# Patient Record
Sex: Female | Born: 1995 | Race: Black or African American | Hispanic: No | Marital: Single | Smoking: Never smoker
Health system: Southern US, Community
[De-identification: ages and names within clinical notes are randomized; demographics above are authoritative.]

## PROBLEM LIST (undated history)

## (undated) ENCOUNTER — Inpatient Hospital Stay (HOSPITAL_COMMUNITY): Payer: Self-pay

## (undated) DIAGNOSIS — T7840XA Allergy, unspecified, initial encounter: Secondary | ICD-10-CM

## (undated) DIAGNOSIS — F32A Depression, unspecified: Secondary | ICD-10-CM

## (undated) DIAGNOSIS — M1990 Unspecified osteoarthritis, unspecified site: Secondary | ICD-10-CM

## (undated) DIAGNOSIS — L309 Dermatitis, unspecified: Secondary | ICD-10-CM

## (undated) DIAGNOSIS — F419 Anxiety disorder, unspecified: Secondary | ICD-10-CM

## (undated) HISTORY — DX: Dermatitis, unspecified: L30.9

## (undated) HISTORY — DX: Unspecified osteoarthritis, unspecified site: M19.90

## (undated) HISTORY — PX: NO PAST SURGERIES: SHX2092

## (undated) HISTORY — DX: Allergy, unspecified, initial encounter: T78.40XA

## (undated) HISTORY — DX: Depression, unspecified: F32.A

## (undated) HISTORY — DX: Anxiety disorder, unspecified: F41.9

---

## 2015-06-18 ENCOUNTER — Encounter (HOSPITAL_COMMUNITY): Payer: Self-pay

## 2015-06-18 ENCOUNTER — Emergency Department (HOSPITAL_COMMUNITY)
Admission: EM | Admit: 2015-06-18 | Discharge: 2015-06-18 | Disposition: A | Discharge status: Home / Self Care | Payer: Self-pay | Attending: Emergency Medicine | Admitting: Emergency Medicine

## 2015-06-18 DIAGNOSIS — H109 Unspecified conjunctivitis: Secondary | ICD-10-CM | POA: Insufficient documentation

## 2015-06-18 MED ORDER — ERYTHROMYCIN 5 MG/GM OP OINT
1.0000 "application " | TOPICAL_OINTMENT | Freq: Three times a day (TID) | OPHTHALMIC | Status: DC
  Administered 2015-06-18: 1 via OPHTHALMIC
  Filled 2015-06-18: qty 3.5

## 2015-06-18 NOTE — ED Provider Notes (Signed)
CSN: [id number]     Arrival date & time 06/18/15  [id number] History   First MD Initiated Contact with Patient 06/18/15 [phone]     Chief Complaint  Patient presents with  . Eye Pain     (Consider location/radiation/quality/duration/timing/severity/associated sxs/prior Treatment) HPI Comments: Pt states that she woke up this morning with redness and drainage. No visual changes. Doesn't wear contacts  Patient is a 19 y.o. female presenting with eye pain. The history is provided by the patient. No language interpreter was used.  Eye Pain This is a new problem. The current episode started today. The problem occurs constantly. The problem has been unchanged. Pertinent negatives include no fever. Nothing aggravates the symptoms. She has tried nothing for the symptoms.    History reviewed. No pertinent past medical history. History reviewed. No pertinent past surgical history. No family history on file. Social History  Substance Use Topics  . Smoking status: Never Smoker   . Smokeless tobacco: None  . Alcohol Use: No   OB History    No data available     Review of Systems  Constitutional: Negative for fever.  Eyes: Positive for pain.  All other systems reviewed and are negative.     Allergies  Apple and Chocolate  Home Medications   Prior to Admission medications   Not on File   BP 117/71 mmHg  Pulse 85  Temp(Src) 97.8 F (36.6 C) (Oral)  Resp 16  Ht 5\' 7"  (1.702 m)  Wt 68.04 kg  BMI 23.49 kg/m2  SpO2 95%  LMP 06/08/2015 Physical Exam  Constitutional: She appears well-developed and well-nourished.  HENT:  Right Ear: External ear normal.  Left Ear: External ear normal.  Eyes: EOM are normal. Pupils are equal, round, and reactive to light. Left eye exhibits discharge. Left conjunctiva is injected.  Cardiovascular: Normal rate and regular rhythm.   Pulmonary/Chest: Effort normal and breath sounds normal.  Musculoskeletal: Normal range of motion.  Nursing note and  vitals reviewed.   ED Course  Procedures (including critical care time) Labs Review Labs Reviewed - No data to display  Imaging Review No results found. I have personally reviewed and evaluated these images and lab results as part of my medical decision-making.   EKG Interpretation None      MDM   Final diagnoses:  Conjunctivitis of left eye    Will give erythromycin ointment. Discussed follow up and return precautions    Teressa LowerVrinda Jayleen Scaglione, NP 06/18/15 [phone]  Laurence Spatesachel Morgan Little, MD 06/18/15 [phone]

## 2015-06-18 NOTE — Discharge Instructions (Signed)

## 2015-06-18 NOTE — ED Notes (Signed)
Pt. Presents with complaint of L eye pain starting this AM. Pt. Eye is red, denies drainage or recent cold symptoms.

## 2015-06-18 NOTE — ED Notes (Signed)
See provider note for assessment

## 2015-06-18 NOTE — ED Notes (Signed)
20/200 visual acuity completed was without pts glasses off.  20/20 and 20/15 visual acuity completed with glasses on.

## 2015-06-21 ENCOUNTER — Emergency Department (INDEPENDENT_AMBULATORY_CARE_PROVIDER_SITE_OTHER)
Admission: EM | Admit: 2015-06-21 | Discharge: 2015-06-21 | Disposition: A | Discharge status: Home / Self Care | Payer: Self-pay | Source: Home / Self Care

## 2015-06-21 DIAGNOSIS — A499 Bacterial infection, unspecified: Secondary | ICD-10-CM

## 2015-06-21 DIAGNOSIS — H109 Unspecified conjunctivitis: Secondary | ICD-10-CM

## 2015-06-21 MED ORDER — POLYMYXIN B-TRIMETHOPRIM 10000-0.1 UNIT/ML-% OP SOLN: 1.0000 [drp] | OPHTHALMIC | Status: DC

## 2015-06-21 NOTE — ED Provider Notes (Signed)
CSN: [id number]     Arrival date & time 06/21/15  1530 History   None    Chief Complaint  Patient presents with  . Conjunctivitis   HPI  20 y/o ? Recent Rx ED with conjunctivitis given erythromycin ointment Had been placing the ointment in left eye and then in the right eye developed conjunctivitis in the right eye as well Left eye is not improved she's had increasing pain, tenderness and sensitivity to light She has no fever no chills She has neck C date and tearing which is increased in the left eye    No past medical history on file. No past surgical history on file. No family history on file. Social History  Substance Use Topics  . Smoking status: Never Smoker   . Smokeless tobacco: Not on file  . Alcohol Use: No   OB History    No data available     Review of Systems  Allergies  Apple and Chocolate  Home Medications   Prior to Admission medications   Not on File   Meds Ordered and Administered this Visit  Medications - No data to display  LMP 06/08/2015 No data found.   Physical Exam  NCAT Injected left eye with tearing external ocular movements are intact however without pain Throat soft supple Chest clinically clear Abdomen soft nontender No added sound  ED Course  Procedures (including critical care time)  Labs Review Labs Reviewed - No data to display  Imaging Review No results found. Left eye distance vision 20/100 Right eye distant 20/50      MDM  No diagnosis found. Patient has worsening conjunctivitis have informed her that the ointment may be passing the infection from 1 I to the other and I have told her to stop this I will prescribe her an over-the-counter generic antibiotic to take for both eyes I have given her the name of the ophthalmologist on call Dr. Genia DelMincey to be seen at her office in 2 days if no better although I think that the cream may have caused her to translocate bacteria 18 to the other and worsening  infection   She understands clearly these instructions   Pleas KochJai Esperansa Sarabia, MD [facility] [phone]    Rhetta MuraJai-Gurmukh Kay Shippy, MD 06/21/15 1744

## 2015-06-21 NOTE — Discharge Instructions (Signed)

## 2015-06-21 NOTE — ED Notes (Signed)
Patient states she was treated this past Friday in the ED for conjunctivitis to her left eye\ Patient is using the ointment prescribed and now having redness and discharge to both eyes The left eye being the worse

## 2016-07-03 ENCOUNTER — Encounter (HOSPITAL_COMMUNITY): Payer: Self-pay | Admitting: Emergency Medicine

## 2016-07-03 ENCOUNTER — Emergency Department (HOSPITAL_COMMUNITY)
Admission: EM | Admit: 2016-07-03 | Discharge: 2016-07-03 | Disposition: A | Discharge status: Home / Self Care | Payer: Self-pay | Attending: Emergency Medicine | Admitting: Emergency Medicine

## 2016-07-03 DIAGNOSIS — B349 Viral infection, unspecified: Secondary | ICD-10-CM

## 2016-07-03 DIAGNOSIS — Z79899 Other long term (current) drug therapy: Secondary | ICD-10-CM | POA: Insufficient documentation

## 2016-07-03 MED ORDER — BENZONATATE 100 MG PO CAPS: 200.0000 mg | ORAL_CAPSULE | Freq: Two times a day (BID) | ORAL | 0 refills | Status: DC | PRN

## 2016-07-03 MED ORDER — OXYMETAZOLINE HCL 0.05 % NA SOLN: 1.0000 | Freq: Two times a day (BID) | NASAL | 0 refills | Status: DC

## 2016-07-03 NOTE — Discharge Instructions (Signed)
Take your medications as prescribed. I also recommend taking Tylenol and ibuprofen as prescribed over-the-counter, oximetry in between doses every 3-4 hours. Continue drinking fluids at home to remain hydrated. I recommend eating a bland diet for the next few days and taper symptoms have improved. °Follow-up with your primary care provider in the next 3-4 days if symptoms have not improved. °Return to the emergency department if symptoms worsen or new onset of headache, neck stiffness, difficulty breathing, coughing up blood, chest pain, abdominal pain, vomiting, unable to keep fluids down.  °

## 2016-07-03 NOTE — ED Provider Notes (Signed)
[facility]-EMERGENCY DEPT Provider Note   CSN: [id number] Arrival date & time: 07/03/16  1517  By signing my name below, I, Natasha Beard, attest that this documentation has been prepared under the direction and in the presence of Melburn Hake, [place]. Electronically Signed: Teofilo Beard, ED Scribe. 07/03/2016. 5:08 PM.    History   Chief Complaint Chief Complaint  Patient presents with  . Cough  . Fever    The history is provided by the patient. No language interpreter was used.   HPI Comments:  Natasha Beard is a 21 y.o. female who presents to the Emergency Department complaining of a persistent cough x 3 days. Pt states that sometimes the cough is productive with thick sputum. Pt complains of associated chest congestion, nasal congestion, sore throat, rhinorrhea, SOB, vomitingx1 three days ago. Pt reports that she had a fever that has resolved. Pt has taken mucinex and ibuprofen (last dose yesterday) with mild relief. Pt reports possible sick contacts at work [employer]). Pt is a light smoker (1 Black and Mild/day).  Pt denies HA, neck stiffness, trismus, drooling, voice change, chest pain, ear pain, abdominal pain, rash.   History reviewed. No pertinent past medical history.  There are no active problems to display for this patient.   History reviewed. No pertinent surgical history.  OB History    No data available       Home Medications    Prior to Admission medications   Medication Sig Start Date End Date Taking? Authorizing Provider  benzonatate (TESSALON) 100 MG capsule Take 2 capsules (200 mg total) by mouth 2 (two) times daily as needed for cough. 07/03/16   Barrett Henle, PA-C  oxymetazoline (AFRIN NASAL SPRAY) 0.05 % nasal spray Place 1 spray into both nostrils 2 (two) times daily. Spray once into each nostril twice daily for up to the next 3 days. Do not use for more than 3 days to prevent rebound rhinorrhea. 07/03/16   Barrett Henle, PA-C    trimethoprim-polymyxin b (POLYTRIM) ophthalmic solution Place 1 drop into both eyes every 4 (four) hours. 06/21/15   Rhetta Mura, MD    Family History No family history on file.  Social History Social History  Substance Use Topics  . Smoking status: Never Smoker  . Smokeless tobacco: Not on file  . Alcohol use No     Allergies   Apple and Chocolate   Review of Systems Review of Systems  Constitutional: Negative for fever.  HENT: Positive for congestion, rhinorrhea and sore throat. Negative for ear pain.   Respiratory: Positive for cough, chest tightness and shortness of breath.   Cardiovascular: Negative for chest pain.  Gastrointestinal: Positive for vomiting. Negative for abdominal pain.     Physical Exam Updated Vital Signs BP 127/80 (BP Location: Left Arm)   Pulse 96   Temp 98.4 F (36.9 C) (Oral)   Resp 18   LMP 06/09/2016   SpO2 100%   Physical Exam  Constitutional: She appears well-developed and well-nourished. No distress.  HENT:  Head: Normocephalic and atraumatic.  Right Ear: Tympanic membrane normal.  Left Ear: Tympanic membrane normal.  Nose: Nose normal. Right sinus exhibits no maxillary sinus tenderness and no frontal sinus tenderness. Left sinus exhibits no maxillary sinus tenderness and no frontal sinus tenderness.  Mouth/Throat: Uvula is midline and mucous membranes are normal. No trismus in the jaw. No uvula swelling. Posterior oropharyngeal erythema present. No oropharyngeal exudate, posterior oropharyngeal edema or tonsillar abscesses. No tonsillar exudate.  Eyes: Conjunctivae and EOM are normal. Right eye exhibits no discharge. Left eye exhibits no discharge. No scleral icterus.  Neck: Normal range of motion. Neck supple.  Cardiovascular: Normal rate, regular rhythm, normal heart sounds and intact distal pulses.   No murmur heard. Pulmonary/Chest: Effort normal and breath sounds normal. No respiratory distress. She has no wheezes. She  has no rales. She exhibits no tenderness.  Abdominal: Soft. She exhibits no distension and no mass. There is no tenderness. There is no rebound and no guarding. No hernia.  Musculoskeletal: She exhibits no edema.  Lymphadenopathy:    She has no cervical adenopathy.  Neurological: She is alert.  Skin: Skin is warm and dry.  Psychiatric: She has a normal mood and affect.  Nursing note and vitals reviewed.    ED Treatments / Results  DIAGNOSTIC STUDIES:  Oxygen Saturation is 100% on RA, normal by my interpretation.    COORDINATION OF CARE:  5:08 PM Discussed treatment plan with pt at bedside and pt agreed to plan.   Labs (all labs ordered are listed, but only abnormal results are displayed) Labs Reviewed - No data to display  EKG  EKG Interpretation None       Radiology No results found.  Procedures Procedures (including critical care time)  Medications Ordered in ED Medications - No data to display   Initial Impression / Assessment and Plan / ED Course  I have reviewed the triage vital signs and the nursing notes.  Pertinent labs & imaging results that were available during my care of the patient were reviewed by me and considered in my medical decision making (see chart for details).  Clinical Course     Patient with symptoms consistent with influenza vs viral URI.  Vitals are stable, no fever.  No signs of dehydration, tolerating PO's.  Lungs are clear. Due to patient's presentation and physical exam a chest x-ray was not ordered bc likely diagnosis of flu vs URI.  Discussed the cost versus benefit of Tamiflu treatment with the patient.  The patient understands that symptoms are greater than the recommended 24-48 hour window of treatment.  Patient will be discharged with instructions to orally hydrate, rest, and use over-the-counter medications such as anti-inflammatories ibuprofen and Aleve for muscle aches and Tylenol for fever.  Patient will also be given a cough  suppressant.    Final Clinical Impressions(s) / ED Diagnoses   Final diagnoses:  Viral illness    New Prescriptions New Prescriptions   BENZONATATE (TESSALON) 100 MG CAPSULE    Take 2 capsules (200 mg total) by mouth 2 (two) times daily as needed for cough.   OXYMETAZOLINE (AFRIN NASAL SPRAY) 0.05 % NASAL SPRAY    Place 1 spray into both nostrils 2 (two) times daily. Spray once into each nostril twice daily for up to the next 3 days. Do not use for more than 3 days to prevent rebound rhinorrhea.   I personally performed the services described in this documentation, which was scribed in my presence. The recorded information has been reviewed and is accurate.     Satira Sarkicole Elizabeth SlickvilleNadeau, [place]-C 07/03/16 1713    Lorre NickAnthony Allen, MD 07/03/16 [id number]

## 2016-07-03 NOTE — ED Triage Notes (Signed)
Pt complaint of cough and fever since Friday; pt reports sore throat worsening with cough. Pt denies CP or SOB.

## 2016-07-03 NOTE — ED Notes (Signed)
Patient was alert, oriented and stable upon discharge. RN went over AVS and patient had no further questions.  

## 2016-09-12 ENCOUNTER — Encounter (HOSPITAL_COMMUNITY): Payer: Self-pay

## 2016-09-12 ENCOUNTER — Emergency Department (HOSPITAL_COMMUNITY)
Admission: EM | Admit: 2016-09-12 | Discharge: 2016-09-12 | Disposition: A | Discharge status: Home / Self Care | Payer: Self-pay | Attending: Emergency Medicine | Admitting: Emergency Medicine

## 2016-09-12 DIAGNOSIS — Y9389 Activity, other specified: Secondary | ICD-10-CM | POA: Insufficient documentation

## 2016-09-12 DIAGNOSIS — M546 Pain in thoracic spine: Secondary | ICD-10-CM | POA: Insufficient documentation

## 2016-09-12 DIAGNOSIS — M545 Low back pain, unspecified: Secondary | ICD-10-CM

## 2016-09-12 DIAGNOSIS — X500XXA Overexertion from strenuous movement or load, initial encounter: Secondary | ICD-10-CM | POA: Insufficient documentation

## 2016-09-12 DIAGNOSIS — Y990 Civilian activity done for income or pay: Secondary | ICD-10-CM | POA: Insufficient documentation

## 2016-09-12 DIAGNOSIS — Y9289 Other specified places as the place of occurrence of the external cause: Secondary | ICD-10-CM | POA: Insufficient documentation

## 2016-09-12 MED ORDER — CYCLOBENZAPRINE HCL 10 MG PO TABS: 10.0000 mg | ORAL_TABLET | Freq: Three times a day (TID) | ORAL | 0 refills | Status: AC | PRN

## 2016-09-12 MED ORDER — IBUPROFEN 800 MG PO TABS: 800.0000 mg | ORAL_TABLET | Freq: Four times a day (QID) | ORAL | 0 refills | Status: AC | PRN

## 2016-09-12 NOTE — ED Triage Notes (Signed)
Pt states thoracic and lumbar back pain X1 week. 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 Pt ambulatory without difficulty.

## 2016-09-12 NOTE — ED Provider Notes (Signed)
[facility]-EMERGENCY DEPT Provider Note   CSN: [id number] Arrival date & time: 09/12/16  1241   By signing my name below, I, Bobbie Stack, attest that this documentation has been prepared under the direction and in the presence of Michela Pitcher, [place]. Electronically Signed: Bobbie Stack, Scribe. 09/12/16. 1:54 PM. History   Chief Complaint Chief Complaint  Patient presents with  . Back Pain    The history is provided by the patient. No language interpreter was used.  HPI Comments: Natasha Beard is a 21 y.o. female who presents to the Emergency Department complaining of non-radiating lower back pain since yesterday. The patient states that she experienced a sudden onset of back pain that began around 8 days ago. She states that the pain was severe "grabbing pain" at this time and then resolved shortly afterwards. The pain returned yesterday but was less severe than her previous episode of back pain. She rates her pain 3/10 currently. She describes the pain as a "squeezing sensation". She states that she had never experienced anything like this in the past. 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 She denies any heavy lifting on the day that the pain began. Pain is localized to mid to low back and does not radiate into the legs. She also denies any recent injuries, falls, numbness, tingling, weakness, leg pain, urinary symptoms, headaches, and dizziness.  History reviewed. No pertinent past medical history.  There are no active problems to display for this patient.   History reviewed. No pertinent surgical history.  OB History    No data available       Home Medications    Prior to Admission medications   Medication Sig Start Date End Date Taking? Authorizing Provider  benzonatate (TESSALON) 100 MG capsule Take 2 capsules (200 mg total) by mouth 2 (two) times daily as needed for cough. 07/03/16   Barrett Henle, PA-C    cyclobenzaprine (FLEXERIL) 10 MG tablet Take 1 tablet (10 mg total) by mouth 3 (three) times daily as needed for muscle spasms. 09/12/16 09/14/16  Jeanie Sewer, PA  ibuprofen (ADVIL,MOTRIN) 800 MG tablet Take 1 tablet (800 mg total) by mouth every 6 (six) hours as needed. 09/12/16 09/15/16  Jeanie Sewer, PA  oxymetazoline (AFRIN NASAL SPRAY) 0.05 % nasal spray Place 1 spray into both nostrils 2 (two) times daily. Spray once into each nostril twice daily for up to the next 3 days. Do not use for more than 3 days to prevent rebound rhinorrhea. 07/03/16   Barrett Henle, PA-C  trimethoprim-polymyxin b (POLYTRIM) ophthalmic solution Place 1 drop into both eyes every 4 (four) hours. 06/21/15   Rhetta Mura, MD    Family History History reviewed. No pertinent family history.  Social History Social History  Substance Use Topics  . Smoking status: Never Smoker  . Smokeless tobacco: Never Used  . Alcohol use No     Allergies   Apple and Chocolate   Review of Systems Review of Systems  Constitutional: Negative for chills and fever.  Respiratory: Negative for shortness of breath.   Cardiovascular: Negative for chest pain.  Gastrointestinal: Negative for abdominal pain, nausea and vomiting.  Genitourinary: Negative for difficulty urinating, dysuria, enuresis and hematuria.  Musculoskeletal: Positive for back pain. Negative for gait problem and neck pain.  Neurological: Negative for dizziness, numbness and headaches.     Physical Exam Updated Vital Signs BP 117/77 (BP Location: Left Arm)  Pulse 92   Temp 98.1 F (36.7 C) (Oral)   Resp 16   Ht 5\' 7"  (1.702 m)   Wt 157 lb (71.2 kg)   LMP 08/31/2016 (Within Days)   SpO2 100%   BMI 24.59 kg/m   Physical Exam  Constitutional: She is oriented to person, place, and time. She appears well-developed and well-nourished. No distress.  HENT:  Head: Normocephalic and atraumatic.  Eyes: Conjunctivae and EOM are normal. Right eye  exhibits no discharge. Left eye exhibits no discharge. No scleral icterus.  Neck: Normal range of motion. Neck supple. No JVD present. No tracheal deviation present. No thyromegaly present.  No CSP tenderness  Cardiovascular: Normal rate.   2+ dp/pt and radial pulses bl  Pulmonary/Chest: Effort normal and breath sounds normal. No respiratory distress.  Abdominal: Soft. There is no tenderness.  Genitourinary:  Genitourinary Comments: No flank pain  Musculoskeletal: Normal range of motion. She exhibits tenderness.  ttp of midline lower thoracic spine to L5/S1. Mild paraspinal muscle pain. No flank pain. Full ROM of lumbar spine and hips. No pain on rotation or extension some pain with flexion of low back. No deformity, crepitus, or step-off on palpation of spine.  Neurological: She is alert and oriented to person, place, and time. No cranial nerve deficit.  No focal numbness, tingling, or weakness. 5/5 strength of BUE and BLE, normal gait, pt can heel walk and toe walk, no facial droop, fluent speech  Skin: Skin is warm and dry. She is not diaphoretic.  Psychiatric: She has a normal mood and affect. Her behavior is normal. Judgment and thought content normal.     ED Treatments / Results  DIAGNOSTIC STUDIES: Oxygen Saturation is 100% on RA, normal by my interpretation.    COORDINATION OF CARE: 1:40 PM Discussed treatment plan with pt at bedside and pt agreed to plan. I will discharge the patient with ibuprofen and muscle relaxer.  Labs (all labs ordered are listed, but only abnormal results are displayed) Labs Reviewed - No data to display  EKG  EKG Interpretation None       Radiology No results found.  Procedures Procedures (including critical care time)  Medications Ordered in ED Medications - No data to display   Initial Impression / Assessment and Plan / ED Course  I have reviewed the triage vital signs and the nursing notes.  Pertinent labs & imaging results that  were available during my care of the patient were reviewed by me and considered in my medical decision making (see chart for details).     20yof presents with 2 episodes of back pain. 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 Pt afebrile, VSS, no apparent distress. TTP of lower T spine and LSP. Neuro exam unremarkable. No apparent instability/deformity of spine, low suspicion CES or spinal abscess. Pain likely myofascial in nature. Discussed use of NSAIDs for pain, muscle relaxer prn for spasm, and proper form when lifting. Recommend follow up with PCP in 2-3 days for re-evaluation of back pain. Will discharge with ibuprofen for pain and flexeril prn muscle spasm. Discussed strict ED return precautions. Pt understands and is agreeable to this plan.   Final Clinical Impressions(s) / ED Diagnoses   Final diagnoses:  Acute midline low back pain without sciatica    New Prescriptions New Prescriptions   CYCLOBENZAPRINE (FLEXERIL) 10 MG TABLET    Take 1 tablet (10 mg total) by mouth 3 (three) times daily as needed for muscle spasms.   IBUPROFEN (ADVIL,MOTRIN) 800  MG TABLET    Take 1 tablet (800 mg total) by mouth every 6 (six) hours as needed.   I personally performed the services described in this documentation, which was scribed in my presence. The recorded information has been reviewed and is accurate.    Jeanie SewerMina A Nolita Kutter, [place] 09/12/16 1615    Canary Brimhristopher J Tegeler, MD 09/13/16 1023

## 2016-12-22 ENCOUNTER — Emergency Department (HOSPITAL_COMMUNITY)
Admission: EM | Admit: 2016-12-22 | Discharge: 2016-12-22 | Disposition: A | Discharge status: Home / Self Care | Payer: Self-pay | Attending: Emergency Medicine | Admitting: Emergency Medicine

## 2016-12-22 ENCOUNTER — Encounter (HOSPITAL_COMMUNITY): Payer: Self-pay | Admitting: Emergency Medicine

## 2016-12-22 DIAGNOSIS — W228XXA Striking against or struck by other objects, initial encounter: Secondary | ICD-10-CM | POA: Insufficient documentation

## 2016-12-22 DIAGNOSIS — Y990 Civilian activity done for income or pay: Secondary | ICD-10-CM | POA: Insufficient documentation

## 2016-12-22 DIAGNOSIS — F17200 Nicotine dependence, unspecified, uncomplicated: Secondary | ICD-10-CM | POA: Insufficient documentation

## 2016-12-22 DIAGNOSIS — S6991XA Unspecified injury of right wrist, hand and finger(s), initial encounter: Secondary | ICD-10-CM

## 2016-12-22 DIAGNOSIS — S60944A Unspecified superficial injury of right ring finger, initial encounter: Secondary | ICD-10-CM | POA: Insufficient documentation

## 2016-12-22 DIAGNOSIS — Y929 Unspecified place or not applicable: Secondary | ICD-10-CM | POA: Insufficient documentation

## 2016-12-22 DIAGNOSIS — Y9389 Activity, other specified: Secondary | ICD-10-CM | POA: Insufficient documentation

## 2016-12-22 MED ORDER — IBUPROFEN 200 MG PO TABS
400.0000 mg | ORAL_TABLET | Freq: Once | ORAL | Status: AC
  Administered 2016-12-22: 400 mg via ORAL
  Filled 2016-12-22: qty 2

## 2016-12-22 MED ORDER — IBUPROFEN 400 MG PO TABS: 400.0000 mg | ORAL_TABLET | Freq: Three times a day (TID) | ORAL | 0 refills | Status: DC | PRN

## 2016-12-22 NOTE — ED Provider Notes (Signed)
[facility]-EMERGENCY DEPT Provider Note    By signing my name below, I, Earmon PhoenixJennifer Waddell, attest that this documentation has been prepared under the direction and in the presence of Novi Calia, PA-C. Electronically Signed: Earmon PhoenixJennifer Waddell, ED Scribe. 12/22/16. 11:46 AM.    History   Chief Complaint Chief Complaint  Patient presents with  . Finger Injury    The history is provided by the patient and medical records. No language interpreter was used.    Natasha Beard is a 21 y.o. female who presents to the Emergency Department complaining of right fourth finger pain that began after smashing it at work about 8 hours ago. She states that a roller came up and hit the distal aspect of her finger. She reports an associated bruise under the nail of the finger. She has not taken anything for pain but ice was applied in triage. Moving the fingers increase the pain. She denies alleviating factors. She denies numbness, tingling or weakness of the right hand or fingers, wrist pain, wounds. Pt is left hand dominant.   History reviewed. No pertinent past medical history.  There are no active problems to display for this patient.   History reviewed. No pertinent surgical history.  OB History    No data available       Home Medications    Prior to Admission medications   Medication Sig Start Date End Date Taking? Authorizing Provider  benzonatate (TESSALON) 100 MG capsule Take 2 capsules (200 mg total) by mouth 2 (two) times daily as needed for cough. 07/03/16   Barrett HenleNadeau, Nicole Elizabeth, PA-C  ibuprofen (ADVIL,MOTRIN) 400 MG tablet Take 1 tablet (400 mg total) by mouth every 8 (eight) hours as needed. 12/22/16   Nitisha Civello, PA-C  oxymetazoline (AFRIN NASAL SPRAY) 0.05 % nasal spray Place 1 spray into both nostrils 2 (two) times daily. Spray once into each nostril twice daily for up to the next 3 days. Do not use for more than 3 days to prevent rebound rhinorrhea. 07/03/16   Barrett HenleNadeau, Nicole  Elizabeth, PA-C  trimethoprim-polymyxin b (POLYTRIM) ophthalmic solution Place 1 drop into both eyes every 4 (four) hours. 06/21/15   Rhetta MuraSamtani, Jai-Gurmukh, MD    Family History No family history on file.  Social History Social History  Substance Use Topics  . Smoking status: Light Tobacco Smoker  . Smokeless tobacco: Never Used  . Alcohol use Yes     Allergies   Apple and Chocolate   Review of Systems Review of Systems  Musculoskeletal: Positive for arthralgias.  Skin: Positive for color change. Negative for wound.  Neurological: Negative for weakness and numbness.     Physical Exam Updated Vital Signs BP 128/77 (BP Location: Left Arm)   Pulse 78   Temp 98.1 F (36.7 C) (Oral)   Resp 12   SpO2 100%   Physical Exam  Constitutional: She is oriented to person, place, and time. She appears well-developed and well-nourished.  HENT:  Head: Normocephalic and atraumatic.  Neck: Normal range of motion.  Cardiovascular: Normal rate.   Pulmonary/Chest: Effort normal.  Musculoskeletal: Normal range of motion. She exhibits tenderness. She exhibits no deformity.  No obvious swelling of the right fourth finger. Minimal bruising noted under the nail. Full ROM of the finger. Sensations intact. Slight TTP of the nail. No TTP of the MCP or PIP joint. No TTP of DIP joint. No injury noted elsewhere.  Neurological: She is alert and oriented to person, place, and time.  Skin: Skin is warm and  dry.  Psychiatric: She has a normal mood and affect. Her behavior is normal.  Nursing note and vitals reviewed.    ED Treatments / Results  DIAGNOSTIC STUDIES: Oxygen Saturation is 100% on RA, normal by my interpretation.   COORDINATION OF CARE: 11:41 AM- Advised pt that imaging is not indicated at this time. Recommended Ibuprofen and icing the area. Will order dose of Ibuprofen prior to discharge. Pt verbalizes understanding and agrees to plan.  Medications  ibuprofen (ADVIL,MOTRIN) tablet  400 mg (400 mg Oral Given 12/22/16 1153)    Labs (all labs ordered are listed, but only abnormal results are displayed) Labs Reviewed - No data to display  EKG  EKG Interpretation None       Radiology No results found.  Procedures Procedures (including critical care time)  Medications Ordered in ED Medications  ibuprofen (ADVIL,MOTRIN) tablet 400 mg (400 mg Oral Given 12/22/16 1153)     Initial Impression / Assessment and Plan / ED Course  I have reviewed the triage vital signs and the nursing notes.  Pertinent labs & imaging results that were available during my care of the patient were reviewed by me and considered in my medical decision making (see chart for details).     Patient presenting for an injury to the right fourth digit that occurred about 8 hours ago while at work. She states she smashed the finger on a roller. Patient with full range of motion of the finger, and neurovascularly intact. Patient likely with minimal soft tissue injury. At this time, I do not believe imaging is necessary, as pain is only in the distal aspect of the finger, and not involving any of the joints. Pt advised to follow up with PCP as needed. Conservative therapy recommended and discussed. One dose of ibuprofen given in the ED. Patient will be discharged home & is agreeable with above plan. Return precautions discussed. Pt appears safe for discharge.   Final Clinical Impressions(s) / ED Diagnoses   Final diagnoses:  Injury of finger of right hand, initial encounter    New Prescriptions Discharge Medication List as of 12/22/2016 11:48 AM    START taking these medications   Details  ibuprofen (ADVIL,MOTRIN) 400 MG tablet Take 1 tablet (400 mg total) by mouth every 8 (eight) hours as needed., Starting Fri 12/22/2016, Print        I personally performed the services described in this documentation, which was scribed in my presence. The recorded information has been reviewed and is  accurate.     Alveria Apley, PA-C 12/22/16 1210    Doug Sou, MD 12/22/16 [phone]

## 2016-12-22 NOTE — Discharge Instructions (Signed)
Take ibuprofen as needed for pain. You may take this 3 times a day as needed. Take it with food. Continue to ice your hand for symptom relief. You may follow-up with primary care or [place] [facility] in one week if pain is not improving. Return to the emergency department if you are completely unable to move your finger, or have any new or worsening symptoms.

## 2016-12-22 NOTE — ED Triage Notes (Signed)
Pt states she smashed her left ring finger at work. Skin intact, no swelling noted.

## 2016-12-22 NOTE — ED Notes (Signed)
Pt verbalized understanding of discharge instructions and denies any further questions at this time.   

## 2018-07-04 ENCOUNTER — Encounter (HOSPITAL_COMMUNITY): Payer: Self-pay

## 2018-07-04 ENCOUNTER — Ambulatory Visit (HOSPITAL_COMMUNITY)
Admission: EM | Admit: 2018-07-04 | Discharge: 2018-07-04 | Disposition: A | Discharge status: Home / Self Care | Payer: BLUE CROSS/BLUE SHIELD | Attending: Emergency Medicine | Admitting: Emergency Medicine

## 2018-07-04 DIAGNOSIS — B9789 Other viral agents as the cause of diseases classified elsewhere: Secondary | ICD-10-CM | POA: Insufficient documentation

## 2018-07-04 DIAGNOSIS — J069 Acute upper respiratory infection, unspecified: Secondary | ICD-10-CM

## 2018-07-04 MED ORDER — IBUPROFEN 600 MG PO TABS: 600.0000 mg | ORAL_TABLET | Freq: Four times a day (QID) | ORAL | 0 refills | Status: DC | PRN

## 2018-07-04 MED ORDER — AEROCHAMBER PLUS MISC: 2 refills | Status: DC

## 2018-07-04 MED ORDER — ALBUTEROL SULFATE HFA 108 (90 BASE) MCG/ACT IN AERS: 1.0000 | INHALATION_SPRAY | Freq: Four times a day (QID) | RESPIRATORY_TRACT | 0 refills | Status: DC | PRN

## 2018-07-04 MED ORDER — HYDROCOD POLST-CPM POLST ER 10-8 MG/5ML PO SUER: 5.0000 mL | Freq: Two times a day (BID) | ORAL | 0 refills | Status: DC | PRN

## 2018-07-04 MED ORDER — BENZONATATE 200 MG PO CAPS: 200.0000 mg | ORAL_CAPSULE | Freq: Three times a day (TID) | ORAL | 0 refills | Status: DC | PRN

## 2018-07-04 MED ORDER — FLUTICASONE PROPIONATE 50 MCG/ACT NA SUSP: 2.0000 | Freq: Every day | NASAL | 0 refills | Status: DC

## 2018-07-04 NOTE — ED Provider Notes (Signed)
HPI  SUBJECTIVE:  Natasha Beard is a 23 y.o. female who presents with nasal congestion, rhinorrhea, postnasal drip, sore throat secondary to the cough, chest congestion, cough productive of clear mucus.  She reports occasional wheezing and chest soreness with coughing.  Occasional dyspnea on exertion.  Unable to sleep at night because of the cough.  No fevers, sinus pain or pressure, ear pain, shortness of breath.  No allergy or GERD symptoms.  No antibiotics in the past month.  No antipyretic in the past 4 to 6 hours.  She has tried a leftover prescription of Tessalon with improvement in her symptoms.  Symptoms are worse with lying down, heavy exertion.  She has a past medical history of food allergies.  No history of GERD, asthma, smoking, vaping, diabetes, hypertension, frequent sinusitis.  LMP: Now.  Denies the possibility of being pregnant.  PMD: None.  History reviewed. No pertinent past medical history.  History reviewed. No pertinent surgical history.  Family History  Problem Relation Age of Onset  . Healthy Mother   . Diabetes Father   . Seizures Father     Social History   Tobacco Use  . Smoking status: Light Tobacco Smoker  . Smokeless tobacco: Never Used  Substance Use Topics  . Alcohol use: Yes  . Drug use: No    No current facility-administered medications for this encounter.   Current Outpatient Medications:  .  albuterol (PROVENTIL HFA;VENTOLIN HFA) 108 (90 Base) MCG/ACT inhaler, Inhale 1-2 puffs into the lungs every 6 (six) hours as needed for wheezing or shortness of breath., Disp: 1 Inhaler, Rfl: 0 .  benzonatate (TESSALON) 200 MG capsule, Take 1 capsule (200 mg total) by mouth 3 (three) times daily as needed for cough., Disp: 30 capsule, Rfl: 0 .  chlorpheniramine-HYDROcodone (TUSSIONEX PENNKINETIC ER) 10-8 MG/5ML SUER, Take 5 mLs by mouth every 12 (twelve) hours as needed for cough., Disp: 60 mL, Rfl: 0 .  fluticasone (FLONASE) 50 MCG/ACT nasal spray, Place 2 sprays  into both nostrils daily., Disp: 16 g, Rfl: 0 .  ibuprofen (ADVIL,MOTRIN) 600 MG tablet, Take 1 tablet (600 mg total) by mouth every 6 (six) hours as needed., Disp: 30 tablet, Rfl: 0 .  Spacer/Aero-Holding Chambers (AEROCHAMBER PLUS) inhaler, Use as instructed, Disp: 1 each, Rfl: 2  Allergies  Allergen Reactions  . Apple   . Chocolate     In moderation     ROS  As noted in HPI.   Physical Exam  BP 130/65   Pulse 87   Temp 98.6 F (37 C)   Resp 18   LMP 07/04/2018   SpO2 100%   Constitutional: Well developed, well nourished, no acute distress Eyes:  EOMI, conjunctiva normal bilaterally HENT: Normocephalic, atraumatic,mucus membranes moist.  Clear nasal congestion, red, swollen turbinates.  No maxillary or frontal sinus tenderness.  Normal tonsils.  Positive cobblestoning, postnasal drip. Respiratory: Normal inspiratory effort good air movement, lungs clear bilaterally.  Positive anterior chest wall tenderness. Cardiovascular: Normal rate regular rhythm, no murmurs, rubs, gallops GI: nondistended skin: No rash, skin intact Musculoskeletal: no deformities Neurologic: Alert & oriented x 3, no focal neuro deficits Psychiatric: Speech and behavior appropriate   ED Course   Medications - No data to display  No orders of the defined types were placed in this encounter.   No results found for this or any previous visit (from the past 24 hour(s)). No results found.  ED Clinical Impression  Viral URI with cough   ED Assessment/Plan  [place] Narcotic database reviewed for this patient, and feel that the risk/benefit ratio today is favorable for proceeding with a prescription for controlled substance.  No opiate prescriptions in the past 2 years.   Pt with a URI.  Supportive treatment with Flonase, Mucinex D, saline nasal irrigation with a Lloyd Huger med rinse and distilled water, ibuprofen/Tylenol combination, Tessalon, Tussionex, albuterol inhaler with a spacer.  Follow-up with  this clinic or follow-up with a primary care physician of her choice in 6 days if not getting better.   Discussed MDM, treatment plan, and plan for follow-up with patient.patient agrees with plan.   Meds ordered this encounter  Medications  . albuterol (PROVENTIL HFA;VENTOLIN HFA) 108 (90 Base) MCG/ACT inhaler    Sig: Inhale 1-2 puffs into the lungs every 6 (six) hours as needed for wheezing or shortness of breath.    Dispense:  1 Inhaler    Refill:  0  . fluticasone (FLONASE) 50 MCG/ACT nasal spray    Sig: Place 2 sprays into both nostrils daily.    Dispense:  16 g    Refill:  0  . Spacer/Aero-Holding Chambers (AEROCHAMBER PLUS) inhaler    Sig: Use as instructed    Dispense:  1 each    Refill:  2  . benzonatate (TESSALON) 200 MG capsule    Sig: Take 1 capsule (200 mg total) by mouth 3 (three) times daily as needed for cough.    Dispense:  30 capsule    Refill:  0  . chlorpheniramine-HYDROcodone (TUSSIONEX PENNKINETIC ER) 10-8 MG/5ML SUER    Sig: Take 5 mLs by mouth every 12 (twelve) hours as needed for cough.    Dispense:  60 mL    Refill:  0  . ibuprofen (ADVIL,MOTRIN) 600 MG tablet    Sig: Take 1 tablet (600 mg total) by mouth every 6 (six) hours as needed.    Dispense:  30 tablet    Refill:  0    *This clinic note was created using Scientist, clinical (histocompatibility and immunogenetics). Therefore, there may be occasional mistakes despite careful proofreading.   ?   Domenick Gong, MD 07/05/18 [phone]

## 2018-07-04 NOTE — ED Triage Notes (Signed)
Pt presents with complaints of cold like symptoms x 3 days. Pt complains of a pressure in her left lower arm that was "making her middle finger twitch".

## 2018-07-04 NOTE — Discharge Instructions (Addendum)
Flonase, Tessalon for the cough during the day and Tussionex for the cough at night.  Start Mucinex-D to keep the mucous thin and to decongest you.  You may take 600 mg of motrin with 1 gram of tylenol up to 3-4 times a day as needed for pain. This is an effective combination for pain. Use a NeilMed sinus rinse as often as you want to to reduce nasal congestion. Follow the directions on the box.   Below is a list of primary care practices who are taking new patients for you to follow-up with. [facility] [address] [place], [place] [address] [phone]  [facility] [phone] [address] [place] [address]  [facility]: [address] [place] [address]  [phone]  [facility]: [address] [place] [address]   [phone]  [facility]: [address] [place] [address]  [phone]  [facility] : [address] [place] [address] [phone]  [facility]: [address] [place] [address] [phone]  [facility] [address] [place] [address] [phone]  Dr. Oneal Grout [address] [facility] [place] [address]  [phone]  Dr. Jackie Plum, [facility]. [address] [place], [place] [address]  [phone]  Go to www.goodrx.com to look up your medications. This will give you a list of where you can find your prescriptions at the most affordable prices. Or ask the pharmacist what the cash price is, or if they have any other discount programs available to help make your medication more affordable. This can be less expensive than what you would pay with insurance.

## 2018-07-29 ENCOUNTER — Other Ambulatory Visit: Payer: Self-pay | Admitting: Obstetrics and Gynecology

## 2018-07-29 DIAGNOSIS — N6320 Unspecified lump in the left breast, unspecified quadrant: Secondary | ICD-10-CM

## 2018-08-07 ENCOUNTER — Ambulatory Visit
Admission: RE | Admit: 2018-08-07 | Discharge: 2018-08-07 | Disposition: A | Discharge status: Home / Self Care | Payer: BLUE CROSS/BLUE SHIELD | Source: Ambulatory Visit | Attending: Obstetrics and Gynecology | Admitting: Obstetrics and Gynecology

## 2018-08-07 DIAGNOSIS — N6320 Unspecified lump in the left breast, unspecified quadrant: Secondary | ICD-10-CM

## 2019-01-12 ENCOUNTER — Other Ambulatory Visit: Payer: Self-pay

## 2019-01-12 ENCOUNTER — Ambulatory Visit (HOSPITAL_COMMUNITY): Payer: BLUE CROSS/BLUE SHIELD

## 2019-01-12 ENCOUNTER — Ambulatory Visit (HOSPITAL_COMMUNITY)
Admission: EM | Admit: 2019-01-12 | Discharge: 2019-01-12 | Disposition: A | Discharge status: Home / Self Care | Payer: BLUE CROSS/BLUE SHIELD | Attending: Family Medicine | Admitting: Family Medicine

## 2019-01-12 ENCOUNTER — Ambulatory Visit (INDEPENDENT_AMBULATORY_CARE_PROVIDER_SITE_OTHER): Payer: BLUE CROSS/BLUE SHIELD

## 2019-01-12 DIAGNOSIS — T148XXA Other injury of unspecified body region, initial encounter: Secondary | ICD-10-CM

## 2019-01-12 DIAGNOSIS — S93401A Sprain of unspecified ligament of right ankle, initial encounter: Secondary | ICD-10-CM | POA: Diagnosis not present

## 2019-01-12 MED ORDER — PREDNISONE 20 MG PO TABS: 60.0000 mg | ORAL_TABLET | Freq: Every day | ORAL | 0 refills | Status: AC

## 2019-01-12 MED ORDER — CYCLOBENZAPRINE HCL 10 MG PO TABS: 10.0000 mg | ORAL_TABLET | Freq: Two times a day (BID) | ORAL | 0 refills | Status: DC | PRN

## 2019-01-12 NOTE — Discharge Instructions (Addendum)
You are being placed in a cam walker and recommended to avoid prolonged standing or excessive exertional activities involving the right leg and ankle.  I am writing you out of work for the next 2 days with a return date of 01/15/19. If symptoms worsen and do not improve with prescribed treatment recommend follow-up at Dr. Archie Endo office.  Contact information is included on your paperwork today.

## 2019-01-12 NOTE — ED Provider Notes (Signed)
[facility]    CSN: [id number] Arrival date & time: 01/12/19  1332      History   Chief Complaint Chief Complaint  Patient presents with  . Leg Pain    HPI Natasha Beard is a 23 y.o. female.   HPI Patient presents today with a complaint of right leg pain. She sustained an injury after tripping and landing hard on her right leg. Endorse right knee, right leg and right ankle pain. She has attempted relief with warm and cool applications. Pain is exacerbated by walking. No prior injuries to right leg. Denies weakness, numbness or tingling of right leg.  OB History   No obstetric history on file.      Home Medications    Prior to Admission medications   Medication Sig Start Date End Date Taking? Authorizing Provider  albuterol (PROVENTIL HFA;VENTOLIN HFA) 108 (90 Base) MCG/ACT inhaler Inhale 1-2 puffs into the lungs every 6 (six) hours as needed for wheezing or shortness of breath. 07/04/18   Melynda Ripple, MD  benzonatate (TESSALON) 200 MG capsule Take 1 capsule (200 mg total) by mouth 3 (three) times daily as needed for cough. 07/04/18   Melynda Ripple, MD  chlorpheniramine-HYDROcodone [facility] PENNKINETIC ER) 10-8 MG/5ML SUER Take 5 mLs by mouth every 12 (twelve) hours as needed for cough. 07/04/18   Melynda Ripple, MD  fluticasone (FLONASE) 50 MCG/ACT nasal spray Place 2 sprays into both nostrils daily. 07/04/18   Melynda Ripple, MD  ibuprofen (ADVIL,MOTRIN) 600 MG tablet Take 1 tablet (600 mg total) by mouth every 6 (six) hours as needed. 07/04/18   Melynda Ripple, MD  Spacer/Aero-Holding Chambers (AEROCHAMBER PLUS) inhaler Use as instructed 07/04/18   Melynda Ripple, MD    Family History Family History  Problem Relation Age of Onset  . Healthy Mother   . Diabetes Father   . Seizures Father     Social History Social History   Tobacco Use  . Smoking status: Light Tobacco Smoker  . Smokeless tobacco: Never Used  Substance Use Topics  .  Alcohol use: Yes  . Drug use: No     Allergies   Apple and Chocolate   Review of Systems Review of Systems Pertinent negatives listed in HPI  Physical Exam Triage Vital Signs ED Triage Vitals  Enc Vitals Group     BP 01/12/19 1424 120/75     Pulse Rate 01/12/19 1424 77     Resp 01/12/19 1424 17     Temp 01/12/19 1424 97.7 F (36.5 C)     Temp Source 01/12/19 1424 Temporal     SpO2 01/12/19 1424 100 %     Weight --      Height --      Head Circumference --      Peak Flow --      Pain Score 01/12/19 1426 7     Pain Loc --      Pain Edu? --      Excl. in [place]? --    No data found.  Updated Vital Signs BP 120/75 (BP Location: Right Arm)   Pulse 77   Temp 97.7 F (36.5 C) (Temporal)   Resp 17   SpO2 100%   Visual Acuity Right Eye Distance:   Left Eye Distance:   Bilateral Distance:    Right Eye Near:   Left Eye Near:    Bilateral Near:     Physical Exam General appearance: alert, well developed, well nourished, cooperative and in no distress  Head: Normocephalic, without obvious abnormality, atraumatic Respiratory: Respirations even and unlabored, normal respiratory rate Heart: rate and rhythm normal. No gallop or murmurs noted on exam  Abdomen: BS +, no distention, no rebound tenderness, or no mass Extremities: No gross deformities Skin: Skin color, texture, turgor normal. No rashes seen  Psych: Appropriate mood and affect. Neurologic: Mental status: Alert, oriented to person, place, and time, thought content appropriate. UC Treatments / Results  Labs (all labs ordered are listed, but only abnormal results are displayed) Labs Reviewed - No data to display  EKG   Radiology Dg Tibia/fibula Right  Result Date: 01/12/2019 CLINICAL DATA:  Larey SeatFell on right leg 2 days ago. Persistent right leg pain. Initial encounter. EXAM: RIGHT TIBIA AND FIBULA - 2 VIEW COMPARISON:  None. FINDINGS: There is no evidence of fracture or other focal bone lesions. Soft tissues are  unremarkable. IMPRESSION: Negative. Electronically Signed   By: Danae OrleansJohn A Stahl M.D.   On: 01/12/2019 16:07   Dg Ankle Complete Right  Result Date: 01/12/2019 CLINICAL DATA:  Fall 2 days ago. Lateral ankle pain. Initial encounter. EXAM: RIGHT ANKLE - COMPLETE 3+ VIEW COMPARISON:  None. FINDINGS: There is no evidence of fracture, dislocation, or joint effusion. There is no evidence of arthropathy or other focal bone abnormality. Soft tissues are unremarkable. IMPRESSION: Negative. Electronically Signed   By: Danae OrleansJohn A Stahl M.D.   On: 01/12/2019 16:09    Procedures Procedures (including critical care time)  Medications Ordered in UC Medications - No data to display  Initial Impression / Assessment and Plan / UC Course  I have reviewed the triage vital signs and the nursing notes.  Pertinent labs & imaging results that were available during my care of the patient were reviewed by me and considered in my medical decision making (see chart for details).   Applied CAM walker to right leg to improve mobility. Imaging negative. Start prednisone 60 mg x 3 days to reduce inflammation. Cyclobenzaprine prescribed as needed for pain. If symptoms do not improve, recommend follow-up with Dr. Gaynell FaceMurphy [facility] office. Patient verbalized understanding and agreement with plan.   Final Clinical Impressions(s) / UC Diagnoses   Final diagnoses:  Muscle strain  Sprain of right ankle, unspecified ligament, initial encounter     Discharge Instructions     You are being placed in a cam walker and recommended to avoid prolonged standing or excessive exertional activities involving the right leg and ankle.  I am writing you out of work for the next 2 days with a return date of 01/15/19. If symptoms worsen and do not improve with prescribed treatment recommend follow-up at Dr. Sherene SiresWainer's office.  Contact information is included on your paperwork today.    ED Prescriptions    Medication Sig Dispense Auth. Provider    predniSONE (DELTASONE) 20 MG tablet Take 3 tablets (60 mg total) by mouth daily with breakfast for 3 days. 9 tablet Bing NeighborsHarris, Diya Gervasi S, FNP   cyclobenzaprine (FLEXERIL) 10 MG tablet Take 1 tablet (10 mg total) by mouth 2 (two) times daily as needed for muscle spasms. 20 tablet Bing NeighborsHarris, Ashwika Freels S, FNP     Controlled Substance Prescriptions [place] Controlled Substance Registry consulted? Not Applicable   Bing NeighborsHarris, Presly Steinruck S, FNP 01/13/19 [phone]

## 2019-01-12 NOTE — ED Triage Notes (Signed)
Per pt she tripped and feel friday and hurt her right leg. No obvious sweling or mark. Pt said painful to walk.

## 2019-02-14 ENCOUNTER — Ambulatory Visit
Admission: RE | Admit: 2019-02-14 | Discharge: 2019-02-14 | Disposition: A | Discharge status: Home / Self Care | Payer: BLUE CROSS/BLUE SHIELD | Source: Ambulatory Visit | Attending: Family Medicine | Admitting: Family Medicine

## 2019-02-14 ENCOUNTER — Other Ambulatory Visit: Payer: Self-pay

## 2019-02-14 ENCOUNTER — Other Ambulatory Visit: Payer: Self-pay | Admitting: Family Medicine

## 2019-02-14 DIAGNOSIS — M25561 Pain in right knee: Secondary | ICD-10-CM

## 2019-04-18 ENCOUNTER — Encounter (HOSPITAL_COMMUNITY): Payer: Self-pay | Admitting: Emergency Medicine

## 2019-04-18 ENCOUNTER — Emergency Department (HOSPITAL_COMMUNITY)
Admission: EM | Admit: 2019-04-18 | Discharge: 2019-04-18 | Disposition: A | Discharge status: Home / Self Care | Payer: BC Managed Care – PPO | Attending: Emergency Medicine | Admitting: Emergency Medicine

## 2019-04-18 ENCOUNTER — Emergency Department (HOSPITAL_COMMUNITY): Payer: BC Managed Care – PPO

## 2019-04-18 ENCOUNTER — Other Ambulatory Visit: Payer: Self-pay

## 2019-04-18 DIAGNOSIS — M79602 Pain in left arm: Secondary | ICD-10-CM | POA: Insufficient documentation

## 2019-04-18 DIAGNOSIS — F17200 Nicotine dependence, unspecified, uncomplicated: Secondary | ICD-10-CM | POA: Insufficient documentation

## 2019-04-18 DIAGNOSIS — Z79899 Other long term (current) drug therapy: Secondary | ICD-10-CM | POA: Diagnosis not present

## 2019-04-18 DIAGNOSIS — Y929 Unspecified place or not applicable: Secondary | ICD-10-CM | POA: Diagnosis not present

## 2019-04-18 DIAGNOSIS — Y9389 Activity, other specified: Secondary | ICD-10-CM | POA: Diagnosis not present

## 2019-04-18 DIAGNOSIS — Y998 Other external cause status: Secondary | ICD-10-CM | POA: Diagnosis not present

## 2019-04-18 DIAGNOSIS — H9311 Tinnitus, right ear: Secondary | ICD-10-CM | POA: Diagnosis not present

## 2019-04-18 DIAGNOSIS — R519 Headache, unspecified: Secondary | ICD-10-CM | POA: Diagnosis present

## 2019-04-18 MED ORDER — HYDROCODONE-ACETAMINOPHEN 5-325 MG PO TABS
1.0000 | ORAL_TABLET | Freq: Once | ORAL | Status: AC
  Administered 2019-04-18: 1 via ORAL
  Filled 2019-04-18: qty 1

## 2019-04-18 MED ORDER — IBUPROFEN 800 MG PO TABS
800.0000 mg | ORAL_TABLET | Freq: Once | ORAL | Status: AC
  Administered 2019-04-18: 19:00:00 800 mg via ORAL
  Filled 2019-04-18: qty 1

## 2019-04-18 MED ORDER — HYDROCODONE-ACETAMINOPHEN 5-325 MG PO TABS: 1.0000 | ORAL_TABLET | Freq: Four times a day (QID) | ORAL | 0 refills | Status: DC | PRN

## 2019-04-18 NOTE — Discharge Instructions (Addendum)
I am sending you home with some Nor[place]. As discussed, take only for severe pain. The medicine can cause you to be drowsy, so do not drive or operate machinery while on the medication. I recommend taking over the counter ibuprofen for the next few days to help with inflammation and swelling. Follow-up with your primary care doctor within the next week to recheck symptoms. Return to the ER with new or worsening symptoms.
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
## 2020-06-02 ENCOUNTER — Encounter (HOSPITAL_COMMUNITY): Payer: Self-pay | Admitting: Emergency Medicine

## 2020-06-02 ENCOUNTER — Emergency Department (HOSPITAL_COMMUNITY)
Admission: EM | Admit: 2020-06-02 | Discharge: 2020-06-02 | Disposition: A | Discharge status: Home / Self Care | Payer: Self-pay | Attending: Emergency Medicine | Admitting: Emergency Medicine

## 2020-06-02 ENCOUNTER — Other Ambulatory Visit: Payer: Self-pay

## 2020-06-02 DIAGNOSIS — M79671 Pain in right foot: Secondary | ICD-10-CM | POA: Insufficient documentation

## 2020-06-02 DIAGNOSIS — F17210 Nicotine dependence, cigarettes, uncomplicated: Secondary | ICD-10-CM | POA: Insufficient documentation

## 2020-06-02 DIAGNOSIS — Y280XXA Contact with sharp glass, undetermined intent, initial encounter: Secondary | ICD-10-CM | POA: Insufficient documentation

## 2020-06-02 DIAGNOSIS — Z79899 Other long term (current) drug therapy: Secondary | ICD-10-CM | POA: Insufficient documentation

## 2020-06-02 MED ORDER — LIDOCAINE 4 % EX CREA: 1.0000 "application " | TOPICAL_CREAM | CUTANEOUS | 0 refills | Status: DC | PRN

## 2020-06-02 NOTE — Discharge Instructions (Addendum)
  Soak foot in warm water at least twice daily.  May dissolve Epsom salts in the water as well.  May apply the lidocaine 2-3 times daily for pain management. Antiinflammatory medications: Take 600 mg of ibuprofen every 6 hours or 440 mg (over the counter dose) to 500 mg (prescription dose) of naproxen every 12 hours for the next 3 days. After this time, these medications may be used as needed for pain. Take these medications with food to avoid upset stomach. Choose only one of these medications, do not take them together. Acetaminophen (generic for Tylenol): Should you continue to have additional pain while taking the ibuprofen or naproxen, you may add in acetaminophen as needed. Your daily total maximum amount of acetaminophen from all sources should be limited to 4000mg /day for persons without liver problems, or 2000mg /day for those with liver problems.  May also use products available over-the-counter typically used for corns or other painful foot ailments in order to relieve pressure off the area in question.  If there is still pain or question of the piece of glass involved after a couple days, follow-up with podiatry.  Call to make an appointment.  Go see the podiatrist or return to the emergency department should you notice redness, swelling, pus drainage, or significantly increased pain.

## 2020-06-02 NOTE — ED Provider Notes (Signed)
[facility]-EMERGENCY DEPT Provider Note   CSN: [id number] Arrival date & time: 06/02/20  1544     History Chief Complaint  Patient presents with  . Foot Pain    Natasha Beard is a 24 y.o. female.  HPI      Natasha Beard is a 24 y.o. female, patient with no pertinent past medical history, presenting to the ED with right foot pain beginning this morning. Patient states she was walking barefoot at her residence when she stepped on a small piece of glass.  She has been trying to remove the piece of glass directly with tweezers and has tried soaking the foot in a bath. Tetanus vaccination up-to-date.  Denies numbness, weakness, other pain.  History reviewed. No pertinent past medical history.  There are no problems to display for this patient.   History reviewed. No pertinent surgical history.   OB History   No obstetric history on file.     Family History  Problem Relation Age of Onset  . Healthy Mother   . Diabetes Father   . Seizures Father     Social History   Tobacco Use  . Smoking status: Light Tobacco Smoker  . Smokeless tobacco: Never Used  Substance Use Topics  . Alcohol use: Not Currently  . Drug use: Not Currently    Home Medications Prior to Admission medications   Medication Sig Start Date End Date Taking? Authorizing Provider  albuterol (PROVENTIL HFA;VENTOLIN HFA) 108 (90 Base) MCG/ACT inhaler Inhale 1-2 puffs into the lungs every 6 (six) hours as needed for wheezing or shortness of breath. 07/04/18   Domenick Gong, MD  benzonatate (TESSALON) 200 MG capsule Take 1 capsule (200 mg total) by mouth 3 (three) times daily as needed for cough. 07/04/18   Domenick Gong, MD  chlorpheniramine-HYDROcodone [facility] PENNKINETIC ER) 10-8 MG/5ML SUER Take 5 mLs by mouth every 12 (twelve) hours as needed for cough. 07/04/18   Domenick Gong, MD  cyclobenzaprine (FLEXERIL) 10 MG tablet Take 1 tablet (10 mg total) by mouth 2 (two) times  daily as needed for muscle spasms. 01/12/19   Bing Neighbors, FNP  fluticasone (FLONASE) 50 MCG/ACT nasal spray Place 2 sprays into both nostrils daily. 07/04/18   Domenick Gong, MD  HYDROcodone-acetaminophen (NORCO/VICODIN) 5-325 MG tablet Take 1 tablet by mouth every 6 (six) hours as needed for severe pain. 04/18/19   Mannie Stabile, PA-C  ibuprofen (ADVIL,MOTRIN) 600 MG tablet Take 1 tablet (600 mg total) by mouth every 6 (six) hours as needed. 07/04/18   Domenick Gong, MD  lidocaine (LMX) 4 % cream Apply 1 application topically as needed. 06/02/20   Alexiz Sustaita, Hillard Danker, PA-C  Spacer/Aero-Holding Chambers (AEROCHAMBER PLUS) inhaler Use as instructed 07/04/18   Domenick Gong, MD    Allergies    Apple and Chocolate  Review of Systems   Review of Systems  Skin: Positive for wound.  Neurological: Negative for weakness and numbness.    Physical Exam Updated Vital Signs BP 119/62 (BP Location: Right Arm)   Pulse 88   Temp 98.2 F (36.8 C) (Oral)   Resp 16   Ht 5\' 7"  (1.702 m)   Wt 61.2 kg   LMP 05/02/2020   SpO2 99%   BMI 21.14 kg/m   Physical Exam Vitals and nursing note reviewed.  Constitutional:      General: She is not in acute distress.    Appearance: She is well-developed and well-nourished. She is not diaphoretic.  HENT:  Head: Normocephalic and atraumatic.  Eyes:     Conjunctiva/sclera: Conjunctivae normal.  Cardiovascular:     Rate and Rhythm: Normal rate and regular rhythm.     Pulses:          Dorsalis pedis pulses are 2+ on the right side.       Posterior tibial pulses are 2+ on the right side.  Pulmonary:     Effort: Pulmonary effort is normal.  Musculoskeletal:     Cervical back: Neck supple.       Feet:  Feet:     Comments: Area of indicated pain.  Tenderness present.  I did not directly visualize a foreign body, however, patient does state the piece of glass is quite tiny. Full range of motion present in the toes of the right  foot. Skin:    General: Skin is warm and dry.     Capillary Refill: Capillary refill takes less than 2 seconds.     Coloration: Skin is not pale.  Neurological:     Mental Status: She is alert.     Comments: Sensation light touch grossly intact in the right toes.  Psychiatric:        Mood and Affect: Mood and affect normal.        Behavior: Behavior normal.     ED Results / Procedures / Treatments   Labs (all labs ordered are listed, but only abnormal results are displayed) Labs Reviewed - No data to display  EKG None  Radiology No results found.  Procedures Procedures (including critical care time)  Medications Ordered in ED Medications - No data to display  ED Course  I have reviewed the triage vital signs and the nursing notes.  Pertinent labs & imaging results that were available during my care of the patient were reviewed by me and considered in my medical decision making (see chart for details).    MDM Rules/Calculators/A&P                          Patient presents with small piece of glass in the right foot.  With this type of suspected foreign body, x-ray is of questionable benefit. Patient referred to podiatry. The patient was given instructions for home care as well as return precautions. Patient voices understanding of these instructions, accepts the plan, and is comfortable with discharge.   Final Clinical Impression(s) / ED Diagnoses Final diagnoses:  Right foot pain    Rx / DC Orders ED Discharge Orders         Ordered    lidocaine (LMX) 4 % cream  As needed        06/02/20 Knox Saliva 06/02/20 1901    Cheryll Cockayne, MD 06/03/20 0002

## 2020-06-02 NOTE — ED Triage Notes (Signed)
24 yo female presented to ED today for right foot pain. Pt states pain started this morning and has caused her to have difficulty walking. She believes she may have glass particles stuck in her foot from her kitchen. Pt states pain is 0/10 until she puts pressure on it to walk then it is 10/10. Pt states she's tried suction, using tweezers and warm salt water soaks with no relief. No other complaints at this time.

## 2020-06-11 ENCOUNTER — Other Ambulatory Visit: Payer: Self-pay

## 2020-06-11 ENCOUNTER — Encounter (HOSPITAL_COMMUNITY): Payer: Self-pay | Admitting: Emergency Medicine

## 2020-06-11 ENCOUNTER — Emergency Department (HOSPITAL_COMMUNITY)
Admission: EM | Admit: 2020-06-11 | Discharge: 2020-06-12 | Disposition: A | Discharge status: Home / Self Care | Payer: HRSA Program | Attending: Emergency Medicine | Admitting: Emergency Medicine

## 2020-06-11 DIAGNOSIS — U071 COVID-19: Secondary | ICD-10-CM | POA: Insufficient documentation

## 2020-06-11 DIAGNOSIS — J029 Acute pharyngitis, unspecified: Secondary | ICD-10-CM | POA: Diagnosis not present

## 2020-06-11 DIAGNOSIS — R059 Cough, unspecified: Secondary | ICD-10-CM | POA: Diagnosis present

## 2020-06-11 DIAGNOSIS — F17200 Nicotine dependence, unspecified, uncomplicated: Secondary | ICD-10-CM | POA: Diagnosis not present

## 2020-06-11 NOTE — ED Triage Notes (Signed)
Patient states sore throat began yesterday. No other symptoms. Patient states she would like to make sure it is not COVID so she can work.

## 2020-06-12 LAB — RESP PANEL BY RT-PCR (FLU A&B, COVID) ARPGX2
Influenza A by PCR: NEGATIVE
Influenza B by PCR: NEGATIVE
SARS Coronavirus 2 by RT PCR: POSITIVE — AB

## 2020-06-12 LAB — GROUP A STREP BY PCR: Group A Strep by PCR: NOT DETECTED

## 2020-06-12 NOTE — Discharge Instructions (Signed)
Your Covid test came back positive.  This is likely the cause of your symptoms.  This is a viral illness, that will need to be treated symptomatically. Use Tylenol or ibuprofen as needed for pain. Make sure you are staying well-hydrated with water. Use over-the-counter cough syrup as needed. You will need to quarantine at home for a total of 10 days from symptom onset.  After 10 days, if you are fever free and without worsening symptoms, you may end quarantine. You may call your primary care doctor as needed for further recommendations. Return to the emergency room if you develop severe chest pain, difficulty breathing, or any new, worsening, or concerning symptoms.

## 2020-06-12 NOTE — ED Provider Notes (Signed)
[facility]-EMERGENCY DEPT Provider Note   CSN: [id number] Arrival date & time: 06/11/20  2310     History Chief Complaint  Patient presents with  . Sore Throat    Nyhla Beard is a 24 y.o. female presenting for evaluation of sore throat.  Patient states yesterday she developed a sore throat.  It is constant, nothing makes it better or worse.  She did have minimal coughing, but this resolved with NyQuil.  She has not taken anything else for symptoms.  She denies fevers, chills, chest pain, shortness of breath, nausea, vomiting abdominal pain.  She denies sick contacts.  She has not received her Covid or flu vaccine.  She has no medical problems, takes no medications daily.  HPI     History reviewed. No pertinent past medical history.  There are no problems to display for this patient.   History reviewed. No pertinent surgical history.   OB History   No obstetric history on file.     Family History  Problem Relation Age of Onset  . Healthy Mother   . Diabetes Father   . Seizures Father     Social History   Tobacco Use  . Smoking status: Light Tobacco Smoker  . Smokeless tobacco: Never Used  Substance Use Topics  . Alcohol use: Not Currently  . Drug use: Not Currently    Home Medications Prior to Admission medications   Medication Sig Start Date End Date Taking? Authorizing Provider  albuterol (PROVENTIL HFA;VENTOLIN HFA) 108 (90 Base) MCG/ACT inhaler Inhale 1-2 puffs into the lungs every 6 (six) hours as needed for wheezing or shortness of breath. 07/04/18   Domenick Gong, MD  benzonatate (TESSALON) 200 MG capsule Take 1 capsule (200 mg total) by mouth 3 (three) times daily as needed for cough. 07/04/18   Domenick Gong, MD  chlorpheniramine-HYDROcodone [facility] PENNKINETIC ER) 10-8 MG/5ML SUER Take 5 mLs by mouth every 12 (twelve) hours as needed for cough. 07/04/18   Domenick Gong, MD  cyclobenzaprine (FLEXERIL) 10 MG tablet Take  1 tablet (10 mg total) by mouth 2 (two) times daily as needed for muscle spasms. 01/12/19   Bing Neighbors, FNP  fluticasone (FLONASE) 50 MCG/ACT nasal spray Place 2 sprays into both nostrils daily. 07/04/18   Domenick Gong, MD  HYDROcodone-acetaminophen (NORCO/VICODIN) 5-325 MG tablet Take 1 tablet by mouth every 6 (six) hours as needed for severe pain. 04/18/19   Mannie Stabile, PA-C  ibuprofen (ADVIL,MOTRIN) 600 MG tablet Take 1 tablet (600 mg total) by mouth every 6 (six) hours as needed. 07/04/18   Domenick Gong, MD  lidocaine (LMX) 4 % cream Apply 1 application topically as needed. 06/02/20   Joy, Hillard Danker, PA-C  Spacer/Aero-Holding Chambers (AEROCHAMBER PLUS) inhaler Use as instructed 07/04/18   Domenick Gong, MD    Allergies    Apple and Chocolate  Review of Systems   Review of Systems  HENT: Positive for sore throat.   Respiratory: Positive for cough.     Physical Exam Updated Vital Signs BP (!) 154/96 (BP Location: Left Arm)   Pulse 90   Temp 98.9 F (37.2 C) (Oral)   Resp 20   Ht 5\' 7"  (1.702 m)   Wt 63.5 kg   SpO2 100%   BMI 21.93 kg/m   Physical Exam Vitals and nursing note reviewed.  Constitutional:      General: She is not in acute distress.    Appearance: She is well-developed and well-nourished.  Comments: Resting in the bed in no acute distress  HENT:     Head: Normocephalic and atraumatic.     Nose: Mucosal edema present.     Comments: Mild nasal mucosal edema    Mouth/Throat:     Comments: OP mildly erythematous without tonsillar swelling or exudate.  Uvula midline.  No trismus.  Handling secretions easily. Eyes:     Extraocular Movements: EOM normal.     Conjunctiva/sclera: Conjunctivae normal.     Pupils: Pupils are equal, round, and reactive to light.  Cardiovascular:     Rate and Rhythm: Normal rate and regular rhythm.     Pulses: Normal pulses and intact distal pulses.  Pulmonary:     Effort: Pulmonary effort is normal. No  respiratory distress.     Breath sounds: Normal breath sounds. No wheezing.     Comments: Speaking in full sentences.  Clear lung sounds in all fields.  Sats stable on room air. Abdominal:     General: Bowel sounds are normal. There is no distension.     Palpations: Abdomen is soft.     Tenderness: There is no abdominal tenderness.  Musculoskeletal:        General: Normal range of motion.     Cervical back: Normal range of motion and neck supple.  Skin:    General: Skin is warm and dry.     Capillary Refill: Capillary refill takes less than 2 seconds.  Neurological:     Mental Status: She is alert and oriented to person, place, and time.  Psychiatric:        Mood and Affect: Mood and affect normal.     ED Results / Procedures / Treatments   Labs (all labs ordered are listed, but only abnormal results are displayed) Labs Reviewed  RESP PANEL BY RT-PCR (FLU A&B, COVID) ARPGX2 - Abnormal; Notable for the following components:      Result Value   SARS Coronavirus 2 by RT PCR POSITIVE (*)    All other components within normal limits  GROUP A STREP BY PCR    EKG None  Radiology No results found.  Procedures Procedures (including critical care time)  Medications Ordered in ED Medications - No data to display  ED Course  I have reviewed the triage vital signs and the nursing notes.  Pertinent labs & imaging results that were available during my care of the patient were reviewed by me and considered in my medical decision making (see chart for details).    MDM Rules/Calculators/A&P                          Patient presenting for evaluation of sore throat.  On exam, patient appears nontoxic.  She denies associated nasal congestion or cough.  Well exam is not completely consistent with strep, in the setting of sore throat, will test for strep.  Also consider viral illness, will test for Covid and flu.  Strep negative.  Covid positive.  Discussed findings with patient.   Discussed symptomatic treatment importance of isolation.  Discussed importance of monitoring for respiratory status.  At this time, patient appears safe for discharge.  Return precautions given.  Patient states she understands and agrees to plan.  Final Clinical Impression(s) / ED Diagnoses Final diagnoses:  COVID-19    Rx / DC Orders ED Discharge Orders    None       Alveria Apley, PA-C 06/12/20 0109    Devoria Albe,  MD 06/12/20 [phone]

## 2021-01-13 ENCOUNTER — Encounter (HOSPITAL_COMMUNITY): Payer: Self-pay

## 2021-01-13 ENCOUNTER — Emergency Department (HOSPITAL_COMMUNITY)
Admission: EM | Admit: 2021-01-13 | Discharge: 2021-01-13 | Disposition: A | Discharge status: Home / Self Care | Payer: Self-pay | Attending: Emergency Medicine | Admitting: Emergency Medicine

## 2021-01-13 ENCOUNTER — Emergency Department (HOSPITAL_COMMUNITY): Payer: Self-pay

## 2021-01-13 ENCOUNTER — Other Ambulatory Visit: Payer: Self-pay

## 2021-01-13 DIAGNOSIS — N3000 Acute cystitis without hematuria: Secondary | ICD-10-CM | POA: Insufficient documentation

## 2021-01-13 DIAGNOSIS — R109 Unspecified abdominal pain: Secondary | ICD-10-CM

## 2021-01-13 DIAGNOSIS — N9489 Other specified conditions associated with female genital organs and menstrual cycle: Secondary | ICD-10-CM | POA: Insufficient documentation

## 2021-01-13 DIAGNOSIS — F17200 Nicotine dependence, unspecified, uncomplicated: Secondary | ICD-10-CM | POA: Insufficient documentation

## 2021-01-13 LAB — CBC WITH DIFFERENTIAL/PLATELET
Abs Immature Granulocytes: 0.01 10*3/uL (ref 0.00–0.07)
Basophils Absolute: 0 10*3/uL (ref 0.0–0.1)
Basophils Relative: 1 %
Eosinophils Absolute: 0 10*3/uL (ref 0.0–0.5)
Eosinophils Relative: 1 %
HCT: 45 % (ref 36.0–46.0)
Hemoglobin: 15.3 g/dL — ABNORMAL HIGH (ref 12.0–15.0)
Immature Granulocytes: 0 %
Lymphocytes Relative: 44 %
Lymphs Abs: 1.9 10*3/uL (ref 0.7–4.0)
MCH: 32.2 pg (ref 26.0–34.0)
MCHC: 34 g/dL (ref 30.0–36.0)
MCV: 94.7 fL (ref 80.0–100.0)
Monocytes Absolute: 0.4 10*3/uL (ref 0.1–1.0)
Monocytes Relative: 9 %
Neutro Abs: 2 10*3/uL (ref 1.7–7.7)
Neutrophils Relative %: 45 %
Platelets: 174 10*3/uL (ref 150–400)
RBC: 4.75 MIL/uL (ref 3.87–5.11)
RDW: 12.4 % (ref 11.5–15.5)
WBC: 4.3 10*3/uL (ref 4.0–10.5)
nRBC: 0 % (ref 0.0–0.2)

## 2021-01-13 LAB — URINALYSIS, ROUTINE W REFLEX MICROSCOPIC
Bilirubin Urine: NEGATIVE
Glucose, UA: NEGATIVE mg/dL
Hgb urine dipstick: NEGATIVE
Ketones, ur: 5 mg/dL — AB
Nitrite: NEGATIVE
Protein, ur: NEGATIVE mg/dL
Specific Gravity, Urine: 1.017 (ref 1.005–1.030)
pH: 7 (ref 5.0–8.0)

## 2021-01-13 LAB — COMPREHENSIVE METABOLIC PANEL
ALT: 17 U/L (ref 0–44)
AST: 16 U/L (ref 15–41)
Albumin: 4 g/dL (ref 3.5–5.0)
Alkaline Phosphatase: 59 U/L (ref 38–126)
Anion gap: 7 (ref 5–15)
BUN: 8 mg/dL (ref 6–20)
CO2: 24 mmol/L (ref 22–32)
Calcium: 9.1 mg/dL (ref 8.9–10.3)
Chloride: 106 mmol/L (ref 98–111)
Creatinine, Ser: 0.6 mg/dL (ref 0.44–1.00)
GFR, Estimated: >60 mL/min (ref >60)
Glucose, Bld: 85 mg/dL (ref 70–99)
Potassium: 4.2 mmol/L (ref 3.5–5.1)
Sodium: 137 mmol/L (ref 135–145)
Total Bilirubin: 0.9 mg/dL (ref 0.3–1.2)
Total Protein: 6.9 g/dL (ref 6.5–8.1)

## 2021-01-13 LAB — LIPASE, BLOOD: Lipase: 24 U/L (ref 11–51)

## 2021-01-13 LAB — I-STAT BETA HCG BLOOD, ED (MC, WL, AP ONLY): I-stat hCG, quantitative: <5 m[IU]/mL (ref <5)

## 2021-01-13 MED ORDER — DICYCLOMINE HCL 20 MG PO TABS: 20.0000 mg | ORAL_TABLET | Freq: Two times a day (BID) | ORAL | 0 refills | Status: DC | PRN

## 2021-01-13 MED ORDER — CEPHALEXIN 500 MG PO CAPS: 500.0000 mg | ORAL_CAPSULE | Freq: Three times a day (TID) | ORAL | 0 refills | Status: AC

## 2021-01-13 NOTE — ED Triage Notes (Signed)
Patient reports that she has had intermittent right flank pain that radiates into the right groin x 1 week, but pain has been more constant in the past 2 days. Patient also c/o diarrhea x 1 week.

## 2021-01-13 NOTE — ED Provider Notes (Signed)
[facility]-EMERGENCY DEPT Provider Note   CSN: [id number] Arrival date & time: 01/13/21  1329     History Chief Complaint  Patient presents with   Flank Pain   Urinary Frequency   Diarrhea    Natasha Beard is a 25 y.o. female presenting for evaluation of intermittent right-sided flank and abdominal pain and urinary frequency.  Patient states she has "always" had this pain, but has been worse and more persistent in the past week.  She reports associated urinary frequency and diarrhea.  She states she is having diarrhea after every p.o. intake.  She reports associated nausea, no vomiting.  No fevers, chills, chest pain, shortness of breath, dysuria, hematuria, or blood in her stool.  No significant pain on the left side.  She is not taken anything for her symptoms.  No sick contacts.  Her last period was a week ago, was normal for her.  Nothing makes her pain better or worse, including urinating or having a bowel movement.  HPI     History reviewed. No pertinent past medical history.  There are no problems to display for this patient.   History reviewed. No pertinent surgical history.   OB History   No obstetric history on file.     Family History  Problem Relation Age of Onset   Healthy Mother    Diabetes Father    Seizures Father     Social History   Tobacco Use   Smoking status: Light Smoker   Smokeless tobacco: Never  Vaping Use   Vaping Use: Never used  Substance Use Topics   Alcohol use: Not Currently   Drug use: Not Currently    Home Medications Prior to Admission medications   Medication Sig Start Date End Date Taking? Authorizing Provider  cephALEXin (KEFLEX) 500 MG capsule Take 1 capsule (500 mg total) by mouth 3 (three) times daily for 5 days. 01/13/21 01/18/21 Yes Brandin Dilday, PA-C  dicyclomine (BENTYL) 20 MG tablet Take 1 tablet (20 mg total) by mouth 2 (two) times daily as needed for spasms. 01/13/21  Yes Maylene Crocker,  PA-C  albuterol (PROVENTIL HFA;VENTOLIN HFA) 108 (90 Base) MCG/ACT inhaler Inhale 1-2 puffs into the lungs every 6 (six) hours as needed for wheezing or shortness of breath. 07/04/18   Domenick Gong, MD  benzonatate (TESSALON) 200 MG capsule Take 1 capsule (200 mg total) by mouth 3 (three) times daily as needed for cough. 07/04/18   Domenick Gong, MD  chlorpheniramine-HYDROcodone [facility] PENNKINETIC ER) 10-8 MG/5ML SUER Take 5 mLs by mouth every 12 (twelve) hours as needed for cough. 07/04/18   Domenick Gong, MD  cyclobenzaprine (FLEXERIL) 10 MG tablet Take 1 tablet (10 mg total) by mouth 2 (two) times daily as needed for muscle spasms. 01/12/19   Bing Neighbors, FNP  fluticasone (FLONASE) 50 MCG/ACT nasal spray Place 2 sprays into both nostrils daily. 07/04/18   Domenick Gong, MD  HYDROcodone-acetaminophen (NORCO/VICODIN) 5-325 MG tablet Take 1 tablet by mouth every 6 (six) hours as needed for severe pain. 04/18/19   Mannie Stabile, PA-C  ibuprofen (ADVIL,MOTRIN) 600 MG tablet Take 1 tablet (600 mg total) by mouth every 6 (six) hours as needed. 07/04/18   Domenick Gong, MD  lidocaine (LMX) 4 % cream Apply 1 application topically as needed. 06/02/20   Joy, Hillard Danker, PA-C  Spacer/Aero-Holding Chambers (AEROCHAMBER PLUS) inhaler Use as instructed 07/04/18   Domenick Gong, MD    Allergies    Apple and Chocolate  Review of Systems   Review of Systems  Gastrointestinal:  Positive for abdominal pain, diarrhea and nausea.  Genitourinary:  Positive for frequency.  All other systems reviewed and are negative.  Physical Exam Updated Vital Signs BP 140/76 (BP Location: Left Arm)   Pulse 92   Temp 98 F (36.7 C) (Oral)   Resp 18   Ht 5\' 7"  (1.702 m)   Wt 65.8 kg   LMP 01/03/2021   SpO2 98%   BMI 22.71 kg/m   Physical Exam Vitals and nursing note reviewed.  Constitutional:      General: She is not in acute distress.    Appearance: Normal appearance.      Comments: Resting in the bed in no acute distress  HENT:     Head: Normocephalic and atraumatic.  Eyes:     Conjunctiva/sclera: Conjunctivae normal.     Pupils: Pupils are equal, round, and reactive to light.  Cardiovascular:     Rate and Rhythm: Normal rate and regular rhythm.     Pulses: Normal pulses.  Pulmonary:     Effort: Pulmonary effort is normal. No respiratory distress.     Breath sounds: Normal breath sounds. No wheezing.     Comments: Speaking in full sentences.  Clear lung sounds in all fields. Abdominal:     General: There is no distension.     Palpations: Abdomen is soft. There is no mass.     Tenderness: There is abdominal tenderness. There is no guarding or rebound.     Comments: Very mild tenderness palpation of right low back and low abdomen bilaterally.  No focal pain at McBurney's point.  No rigidity, guarding, distention.  Negative rebound.  No peritonitis.  Musculoskeletal:        General: Normal range of motion.     Cervical back: Normal range of motion and neck supple.  Skin:    General: Skin is warm and dry.     Capillary Refill: Capillary refill takes less than 2 seconds.  Neurological:     Mental Status: She is alert and oriented to person, place, and time.  Psychiatric:        Mood and Affect: Mood and affect normal.        Speech: Speech normal.        Behavior: Behavior normal.    ED Results / Procedures / Treatments   Labs (all labs ordered are listed, but only abnormal results are displayed) Labs Reviewed  URINALYSIS, ROUTINE W REFLEX MICROSCOPIC - Abnormal; Notable for the following components:      Result Value   APPearance HAZY (*)    Ketones, ur 5 (*)    Leukocytes,Ua SMALL (*)    Bacteria, UA RARE (*)    All other components within normal limits  CBC WITH DIFFERENTIAL/PLATELET - Abnormal; Notable for the following components:   Hemoglobin 15.3 (*)    All other components within normal limits  COMPREHENSIVE METABOLIC PANEL  LIPASE,  BLOOD  I-STAT BETA HCG BLOOD, ED ([facility], [facility], [facility] ONLY)    EKG None  Radiology CT Renal Stone Study  Result Date: 01/13/2021 CLINICAL DATA:  25 year female with flank pain. Concern for kidney stone. EXAM: CT ABDOMEN AND PELVIS WITHOUT CONTRAST TECHNIQUE: Multidetector CT imaging of the abdomen and pelvis was performed following the standard protocol without IV contrast. COMPARISON:  None. FINDINGS: Evaluation of this exam is limited in the absence of intravenous contrast. Lower chest: The visualized lung bases are clear. No intra-abdominal  free air. Trace free fluid within the pelvis. Hepatobiliary: No focal liver abnormality is seen. No gallstones, gallbladder wall thickening, or biliary dilatation. Pancreas: Unremarkable. No pancreatic ductal dilatation or surrounding inflammatory changes. Spleen: Normal in size without focal abnormality. Adrenals/Urinary Tract: The adrenal glands, kidneys, visualized ureters, and the urinary bladder appear unremarkable. Stomach/Bowel: There is no bowel obstruction or active inflammation. Mild thickened appearance of the colon, likely related to underdistention. The appendix is normal. Vascular/Lymphatic: The abdominal aorta and IVC are grossly unremarkable on this noncontrast CT. No portal venous gas. There is no adenopathy. Reproductive: The uterus is retroflexed. No adnexal masses. Other: None Musculoskeletal: No acute or significant osseous findings. IMPRESSION: No acute intra-abdominal or pelvic pathology. No hydronephrosis or nephrolithiasis. Electronically Signed   By: Elgie Collard M.D.   On: 01/13/2021 15:15    Procedures Procedures   Medications Ordered in ED Medications - No data to display  ED Course  I have reviewed the triage vital signs and the nursing notes.  Pertinent labs & imaging results that were available during my care of the patient were reviewed by me and considered in my medical decision making (see chart for details).     MDM Rules/Calculators/A&P                           Patient presented for evaluation of intermittent abdominal pain, diarrhea, nausea, urinary frequency.  On exam, patient appears nontoxic.  Abdominal exam is overall reassuring, she does have some mild discomfort but exam is not consistent with peritonitis or acute abdomen.  Labs obtained from triage interpreted by me, overall reassuring.  Urine does show some  trace leukocytes and rare bacteria, in the setting of urinary symptoms and discomfort will treat for UTI.  Also consider viral illness causing diarrhea and abdominal cramping, will treat with Bentyl.  Will have patient follow-up with PCP.  CT scan obtained from triage negative for infection or kidney stone.  Resources given for GI as needed if pain persist.  At this time, patient appears safe for discharge.  Return precautions given.  Patient states she understands and agrees to plan.  Final Clinical Impression(s) / ED Diagnoses Final diagnoses:  Acute cystitis without hematuria  Intermittent abdominal pain    Rx / DC Orders ED Discharge Orders          Ordered    cephALEXin (KEFLEX) 500 MG capsule  3 times daily        01/13/21 1630    dicyclomine (BENTYL) 20 MG tablet  2 times daily PRN        01/13/21 1630             Ireland Virrueta, PA-C 01/13/21 1636    Gwyneth Sprout, MD 01/14/21 1754

## 2021-01-13 NOTE — ED Provider Notes (Signed)
Emergency Medicine Provider Triage Evaluation Note  Natasha Beard , a 25 y.o. female  was evaluated in triage.  Pt complains of flank pain on R side for one week with urinary frequency. No dysuria. Diarrhea for the last week. No vomiting, fevers. Worse when she eats.   Review of Systems  Positive: Flank pain, nausea, diarrhea, urinary freuq Negative: Dysuria, hematuria   Physical Exam  BP 140/76 (BP Location: Left Arm)   Pulse 92   Temp 98 F (36.7 C) (Oral)   Resp 18   Ht 5\' 7"  (1.702 m)   Wt 65.8 kg   LMP 01/03/2021 (Exact Date)   SpO2 98%   BMI 22.71 kg/m  Gen:   Awake, no distress   Resp:  Normal effort  MSK:   Moves extremities without difficulty  Other:    Medical Decision Making  Medically screening exam initiated at 1:57 PM.  Appropriate orders placed.  Daija Routson was informed that the remainder of the evaluation will be completed by another provider, this initial triage assessment does not replace that evaluation, and the importance of remaining in the ED until their evaluation is complete.     [name], PA-C 01/13/21 1359    01/15/21, MD 01/15/21 [phone]

## 2021-01-13 NOTE — Discharge Instructions (Addendum)
Your urine showed that there may be signs of early infection.  Take antibiotics as prescribed.  Take the entire course, even if symptoms improve. Make sure you are staying well-hydrated water.  Your urine should be clear to pale yellow. Use Tylenol as needed for abdominal pain.  You may also take Bentyl as needed for abdominal pain, cramping, or spasm. Follow-up with your primary care doctor symptoms not improving. If you continue to have stomach pain after treatment, follow-up with the GI doctor listed below. Return to the emergency room if you develop high fevers, persistent vomiting, severe worsening pain, inability urinate, or any new, worsening, or concerning symptoms.

## 2021-05-08 ENCOUNTER — Encounter (HOSPITAL_COMMUNITY): Payer: Self-pay

## 2021-05-08 ENCOUNTER — Ambulatory Visit (HOSPITAL_COMMUNITY)
Admission: EM | Admit: 2021-05-08 | Discharge: 2021-05-08 | Disposition: A | Discharge status: Home / Self Care | Payer: No Typology Code available for payment source | Source: Ambulatory Visit | Attending: Emergency Medicine | Admitting: Emergency Medicine

## 2021-05-08 ENCOUNTER — Other Ambulatory Visit: Payer: Self-pay

## 2021-05-08 ENCOUNTER — Emergency Department (HOSPITAL_COMMUNITY)
Admission: EM | Admit: 2021-05-08 | Discharge: 2021-05-08 | Disposition: A | Discharge status: Home / Self Care | Payer: Medicaid Other | Attending: Emergency Medicine | Admitting: Emergency Medicine

## 2021-05-08 DIAGNOSIS — R102 Pelvic and perineal pain: Secondary | ICD-10-CM | POA: Insufficient documentation

## 2021-05-08 DIAGNOSIS — Z0441 Encounter for examination and observation following alleged adult rape: Secondary | ICD-10-CM | POA: Insufficient documentation

## 2021-05-08 DIAGNOSIS — M25532 Pain in left wrist: Secondary | ICD-10-CM | POA: Insufficient documentation

## 2021-05-08 DIAGNOSIS — T7421XA Adult sexual abuse, confirmed, initial encounter: Secondary | ICD-10-CM | POA: Insufficient documentation

## 2021-05-08 DIAGNOSIS — M25531 Pain in right wrist: Secondary | ICD-10-CM | POA: Insufficient documentation

## 2021-05-08 DIAGNOSIS — F17200 Nicotine dependence, unspecified, uncomplicated: Secondary | ICD-10-CM | POA: Insufficient documentation

## 2021-05-08 LAB — URINALYSIS, ROUTINE W REFLEX MICROSCOPIC
Bacteria, UA: NONE SEEN
Bilirubin Urine: NEGATIVE
Glucose, UA: NEGATIVE mg/dL
Hgb urine dipstick: NEGATIVE
Ketones, ur: 20 mg/dL — AB
Nitrite: NEGATIVE
Protein, ur: NEGATIVE mg/dL
Specific Gravity, Urine: 1.027 (ref 1.005–1.030)
pH: 5 (ref 5.0–8.0)

## 2021-05-08 LAB — PREGNANCY, URINE: Preg Test, Ur: NEGATIVE

## 2021-05-08 LAB — COMPREHENSIVE METABOLIC PANEL
ALT: 12 U/L (ref 0–44)
AST: 13 U/L — ABNORMAL LOW (ref 15–41)
Albumin: 4.8 g/dL (ref 3.5–5.0)
Alkaline Phosphatase: 59 U/L (ref 38–126)
Anion gap: 8 (ref 5–15)
BUN: 11 mg/dL (ref 6–20)
CO2: 21 mmol/L — ABNORMAL LOW (ref 22–32)
Calcium: 9.4 mg/dL (ref 8.9–10.3)
Chloride: 107 mmol/L (ref 98–111)
Creatinine, Ser: 0.46 mg/dL (ref 0.44–1.00)
GFR, Estimated: >60 mL/min (ref >60)
Glucose, Bld: 91 mg/dL (ref 70–99)
Potassium: 3.7 mmol/L (ref 3.5–5.1)
Sodium: 136 mmol/L (ref 135–145)
Total Bilirubin: 1 mg/dL (ref 0.3–1.2)
Total Protein: 7.5 g/dL (ref 6.5–8.1)

## 2021-05-08 LAB — RAPID HIV SCREEN (HIV 1/2 AB+AG)
HIV 1/2 Antibodies: NONREACTIVE
HIV-1 P24 Antigen - HIV24: NONREACTIVE

## 2021-05-08 LAB — HEPATITIS C ANTIBODY: HCV Ab: NONREACTIVE

## 2021-05-08 LAB — HEPATITIS B SURFACE ANTIGEN: Hepatitis B Surface Ag: NONREACTIVE

## 2021-05-08 MED ORDER — METRONIDAZOLE 500 MG PO TABS
2000.0000 mg | ORAL_TABLET | Freq: Once | ORAL | Status: AC
  Administered 2021-05-08: 2000 mg via ORAL
  Filled 2021-05-08: qty 4

## 2021-05-08 MED ORDER — ELVITEG-COBIC-EMTRICIT-TENOFAF 150-150-200-10 MG PO TABS: 1.0000 | ORAL_TABLET | Freq: Every day | ORAL | 0 refills | Status: AC

## 2021-05-08 MED ORDER — GENVOYA 150-150-200-10 MG PO TABS
1.0000 | ORAL_TABLET | Freq: Every day | ORAL | 0 refills | Status: DC
  Filled 2021-05-08: qty 30, 30d supply, fill #0

## 2021-05-08 MED ORDER — CEFTRIAXONE SODIUM 1 G IJ SOLR
500.0000 mg | Freq: Once | INTRAMUSCULAR | Status: AC
  Administered 2021-05-08: 500 mg via INTRAMUSCULAR
  Filled 2021-05-08: qty 10

## 2021-05-08 MED ORDER — ULIPRISTAL ACETATE 30 MG PO TABS
30.0000 mg | ORAL_TABLET | Freq: Once | ORAL | Status: AC
  Administered 2021-05-08: 30 mg via ORAL
  Filled 2021-05-08: qty 1

## 2021-05-08 MED ORDER — LIDOCAINE HCL (PF) 1 % IJ SOLN
1.0000 mL | Freq: Once | INTRAMUSCULAR | Status: AC
  Administered 2021-05-08: 1 mL
  Filled 2021-05-08: qty 30

## 2021-05-08 MED ORDER — ELVITEG-COBIC-EMTRICIT-TENOFAF 150-150-200-10 MG PREPACK
1.0000 | ORAL_TABLET | Freq: Once | ORAL | Status: AC
  Administered 2021-05-08: 1 via ORAL
  Filled 2021-05-08: qty 1

## 2021-05-08 MED ORDER — AZITHROMYCIN 250 MG PO TABS
1000.0000 mg | ORAL_TABLET | Freq: Once | ORAL | Status: AC
  Administered 2021-05-08: 1000 mg via ORAL
  Filled 2021-05-08: qty 4
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
## 2021-05-09 ENCOUNTER — Other Ambulatory Visit (HOSPITAL_COMMUNITY): Payer: Self-pay

## 2021-05-09 LAB — GC/CHLAMYDIA PROBE AMP (~~LOC~~) NOT AT ARMC
Chlamydia: POSITIVE — AB
Comment: NEGATIVE
Comment: NORMAL
Neisseria Gonorrhea: NEGATIVE

## 2021-05-09 LAB — RPR: RPR Ser Ql: NONREACTIVE

## 2022-06-19 NOTE — L&D Delivery Note (Addendum)
Obstetrical Delivery Note   Date of Delivery:   06/07/2023 Primary OB:   [facility] Gestational Age/EDD: [redacted]w[redacted]d Reason for Admission: Active labor Antepartum complications: none  Delivered By:   Cornelia Copa MD and Wyn Forster MD (OB Fellow)  Delivery Type:   forceps, low  Delivery Details:   Called to see patient for possible operative vaginal delivery due to deep and recurrent decelerations with pushing. Patient examined, EFW 3100gm, DOA and patient pushed well with BPD +3 in between pushing with minimal caput. Risks of OVD, in particular risk of larger tear and injury to baby discussed with patient and she was amenable to proceeding. Foley removed and Kiwi applied to flexion point. Over the two contractions the baby was easily delivered with vacuum used per manufacturer's instructions with suction released in between contractions. . Anesthesia:    epidural Intrapartum complications: Non reassuring fetal heart tones with pushing GBS:    Positive and adequately treated Laceration:    none Episiotomy:    none Rectal exam:   deferred Placenta:    Delivered and expressed via active management. Intact: yes. To pathology: no.  Delayed Cord Clamping: yes Estimated Blood Loss:   Baby:    Liveborn female, APGARs 2/10, weight 3210gm   Cornelia Copa MD Attending [facility])

## 2022-10-03 ENCOUNTER — Encounter (HOSPITAL_BASED_OUTPATIENT_CLINIC_OR_DEPARTMENT_OTHER): Payer: Self-pay | Admitting: Emergency Medicine

## 2022-10-03 ENCOUNTER — Other Ambulatory Visit: Payer: Self-pay

## 2022-10-03 ENCOUNTER — Emergency Department (HOSPITAL_BASED_OUTPATIENT_CLINIC_OR_DEPARTMENT_OTHER)
Admission: EM | Admit: 2022-10-03 | Discharge: 2022-10-03 | Disposition: A | Discharge status: Home / Self Care | Payer: Commercial Managed Care - HMO | Attending: Emergency Medicine | Admitting: Emergency Medicine

## 2022-10-03 ENCOUNTER — Emergency Department (HOSPITAL_BASED_OUTPATIENT_CLINIC_OR_DEPARTMENT_OTHER): Payer: Commercial Managed Care - HMO

## 2022-10-03 DIAGNOSIS — R1031 Right lower quadrant pain: Secondary | ICD-10-CM | POA: Insufficient documentation

## 2022-10-03 DIAGNOSIS — O219 Vomiting of pregnancy, unspecified: Secondary | ICD-10-CM | POA: Diagnosis present

## 2022-10-03 LAB — URINALYSIS, ROUTINE W REFLEX MICROSCOPIC
Bilirubin Urine: NEGATIVE
Glucose, UA: NEGATIVE mg/dL
Hgb urine dipstick: NEGATIVE
Ketones, ur: NEGATIVE mg/dL
Leukocytes,Ua: NEGATIVE
Nitrite: NEGATIVE
Protein, ur: NEGATIVE mg/dL
Specific Gravity, Urine: 1.022 (ref 1.005–1.030)
pH: 7 (ref 5.0–8.0)

## 2022-10-03 LAB — COMPREHENSIVE METABOLIC PANEL
ALT: 17 U/L (ref 0–44)
AST: 15 U/L (ref 15–41)
Albumin: 3.9 g/dL (ref 3.5–5.0)
Alkaline Phosphatase: 42 U/L (ref 38–126)
Anion gap: 9 (ref 5–15)
BUN: 6 mg/dL (ref 6–20)
CO2: 19 mmol/L — ABNORMAL LOW (ref 22–32)
Calcium: 9 mg/dL (ref 8.9–10.3)
Chloride: 107 mmol/L (ref 98–111)
Creatinine, Ser: 0.45 mg/dL (ref 0.44–1.00)
GFR, Estimated: >60 mL/min (ref >60)
Glucose, Bld: 101 mg/dL — ABNORMAL HIGH (ref 70–99)
Potassium: 3.8 mmol/L (ref 3.5–5.1)
Sodium: 135 mmol/L (ref 135–145)
Total Bilirubin: 0.6 mg/dL (ref 0.3–1.2)
Total Protein: 6 g/dL — ABNORMAL LOW (ref 6.5–8.1)

## 2022-10-03 LAB — CBC WITH DIFFERENTIAL/PLATELET
Abs Immature Granulocytes: 0.03 10*3/uL (ref 0.00–0.07)
Basophils Absolute: 0 10*3/uL (ref 0.0–0.1)
Basophils Relative: 0 %
Eosinophils Absolute: 0.1 10*3/uL (ref 0.0–0.5)
Eosinophils Relative: 1 %
HCT: 42.2 % (ref 36.0–46.0)
Hemoglobin: 14.5 g/dL (ref 12.0–15.0)
Immature Granulocytes: 1 %
Lymphocytes Relative: 29 %
Lymphs Abs: 1.9 10*3/uL (ref 0.7–4.0)
MCH: 31.4 pg (ref 26.0–34.0)
MCHC: 34.4 g/dL (ref 30.0–36.0)
MCV: 91.3 fL (ref 80.0–100.0)
Monocytes Absolute: 0.7 10*3/uL (ref 0.1–1.0)
Monocytes Relative: 11 %
Neutro Abs: 3.8 10*3/uL (ref 1.7–7.7)
Neutrophils Relative %: 58 %
Platelets: 192 10*3/uL (ref 150–400)
RBC: 4.62 MIL/uL (ref 3.87–5.11)
RDW: 13.3 % (ref 11.5–15.5)
WBC: 6.5 10*3/uL (ref 4.0–10.5)
nRBC: 0 % (ref 0.0–0.2)

## 2022-10-03 LAB — HCG, QUANTITATIVE, PREGNANCY: hCG, Beta Chain, Quant, S: 6881 m[IU]/mL — ABNORMAL HIGH (ref <5)

## 2022-10-03 MED ORDER — LACTATED RINGERS IV BOLUS
1000.0000 mL | Freq: Once | INTRAVENOUS | Status: AC
  Administered 2022-10-03: 1000 mL via INTRAVENOUS

## 2022-10-03 MED ORDER — ONDANSETRON HCL 4 MG/2ML IJ SOLN
4.0000 mg | Freq: Once | INTRAMUSCULAR | Status: AC
  Administered 2022-10-03: 4 mg via INTRAVENOUS
  Filled 2022-10-03: qty 2

## 2022-10-03 MED ORDER — ONDANSETRON 4 MG PO TBDP: 4.0000 mg | ORAL_TABLET | Freq: Three times a day (TID) | ORAL | 0 refills | Status: DC | PRN

## 2022-10-03 NOTE — ED Provider Notes (Signed)
[place] [facility] DEPARTMENT AT [facility] Provider Note   CSN: [id number] Arrival date & time: 10/03/22  1354     History  Chief Complaint  Patient presents with   Emesis During Pregnancy    Natasha Beard is a 27 y.o. female.  HPI   27 year old G1P1 female who presents to the emergency department with positive home pregnancy test.  She states that her LMP was 08/31/2023.  She has had at least 2 pregnancy test that were positive at home.  She states that yesterday she had some vaginal spotting.  She denies any dysuria or increased urinary frequency.  She endorses some generalized pelvic cramping.  No abnormal vaginal discharge.  She presents with continued uncontrolled NBNB emesis today.  She has been unable to keep anything down.  No fevers or chills.  Home Medications Prior to Admission medications   Medication Sig Start Date End Date Taking? Authorizing Provider  ondansetron (ZOFRAN-ODT) 4 MG disintegrating tablet Take 1 tablet (4 mg total) by mouth every 8 (eight) hours as needed. 10/03/22  Yes Ernie Avena, MD  albuterol (PROVENTIL HFA;VENTOLIN HFA) 108 (90 Base) MCG/ACT inhaler Inhale 1-2 puffs into the lungs every 6 (six) hours as needed for wheezing or shortness of breath. 07/04/18   Domenick Gong, MD  benzonatate (TESSALON) 200 MG capsule Take 1 capsule (200 mg total) by mouth 3 (three) times daily as needed for cough. 07/04/18   Domenick Gong, MD  chlorpheniramine-HYDROcodone [facility] PENNKINETIC ER) 10-8 MG/5ML SUER Take 5 mLs by mouth every 12 (twelve) hours as needed for cough. 07/04/18   Domenick Gong, MD  cyclobenzaprine (FLEXERIL) 10 MG tablet Take 1 tablet (10 mg total) by mouth 2 (two) times daily as needed for muscle spasms. 01/12/19   Bing Neighbors, NP  dicyclomine (BENTYL) 20 MG tablet Take 1 tablet (20 mg total) by mouth 2 (two) times daily as needed for spasms. 01/13/21   Caccavale, Sophia, PA-C   elvitegravir-cobicistat-emtricitabine-tenofovir (GENVOYA) 150-150-200-10 MG TABS tablet Take 1 tablet by mouth daily with breakfast. 05/08/21   Prosperi, Christian H, PA-C  fluticasone (FLONASE) 50 MCG/ACT nasal spray Place 2 sprays into both nostrils daily. 07/04/18   Domenick Gong, MD  HYDROcodone-acetaminophen (NORCO/VICODIN) 5-325 MG tablet Take 1 tablet by mouth every 6 (six) hours as needed for severe pain. 04/18/19   Mannie Stabile, PA-C  ibuprofen (ADVIL,MOTRIN) 600 MG tablet Take 1 tablet (600 mg total) by mouth every 6 (six) hours as needed. 07/04/18   Domenick Gong, MD  lidocaine (LMX) 4 % cream Apply 1 application topically as needed. 06/02/20   Joy, Hillard Danker, PA-C  Spacer/Aero-Holding Chambers (AEROCHAMBER PLUS) inhaler Use as instructed 07/04/18   Domenick Gong, MD      Allergies    Apple juice and Chocolate    Review of Systems   Review of Systems  All other systems reviewed and are negative.   Physical Exam Updated Vital Signs BP (!) 127/97 (BP Location: Right Arm)   Pulse 93   Temp 98.5 F (36.9 C) (Oral)   Resp 16   Ht  (1.702 m)   Wt 87.5 kg   LMP 08/31/2022 (Exact Date)   SpO2 99%   BMI 30.23 kg/m  Physical Exam Vitals and nursing note reviewed. Exam conducted with a chaperone present.  Constitutional:      General: She is not in acute distress.    Appearance: She is well-developed.  HENT:     Head: Normocephalic and atraumatic.  Eyes:  Conjunctiva/sclera: Conjunctivae normal.  Cardiovascular:     Rate and Rhythm: Normal rate and regular rhythm.  Pulmonary:     Effort: Pulmonary effort is normal. No respiratory distress.     Breath sounds: Normal breath sounds.  Abdominal:     General: There is distension.     Palpations: Abdomen is soft.     Tenderness: There is abdominal tenderness in the right lower quadrant and suprapubic area.  Genitourinary:    Cervix: No cervical bleeding.     Comments: Cervical os  closed Musculoskeletal:        General: No swelling.     Cervical back: Neck supple.  Skin:    General: Skin is warm and dry.     Capillary Refill: Capillary refill takes less than 2 seconds.  Neurological:     Mental Status: She is alert.  Psychiatric:        Mood and Affect: Mood normal.     ED Results / Procedures / Treatments   Labs (all labs ordered are listed, but only abnormal results are displayed) Labs Reviewed  COMPREHENSIVE METABOLIC PANEL - Abnormal; Notable for the following components:      Result Value   CO2 19 (*)    Glucose, Bld 101 (*)    Total Protein 6.0 (*)    All other components within normal limits  HCG, QUANTITATIVE, PREGNANCY - Abnormal; Notable for the following components:   hCG, Beta Chain, Quant, S 6,881 (*)    All other components within normal limits  CBC WITH DIFFERENTIAL/PLATELET  URINALYSIS, ROUTINE W REFLEX MICROSCOPIC    EKG None  Radiology US OB LESS THAN 14 WEEKS WITH OB TRANSVAGINAL  Result Date: 10/03/2022 CLINICAL DATA:  Pelvic pain.  Positive pregnancy test. EXAM: OBSTETRIC <14 WK [place] AND TRANSVAGINAL OB US TECHNIQUE: Both transabdominal and transvaginal ultrasound examinations were performed for complete evaluation of the gestation as well as the maternal uterus, adnexal regions, and pelvic cul-de-sac. Transvaginal technique was performed to assess early pregnancy. COMPARISON:  None Available. FINDINGS: Intrauterine gestational sac: Present Yolk sac:  Present Embryo:  None Cardiac Activity: N/A Heart Rate: N/A bpm MSD: 9.7 mm   5 w   5 d Subchorionic hemorrhage:  Small Maternal uterus/adnexae: Both ovaries are normal. IMPRESSION: Probable early intrauterine gestational sac and small yolk sac. No fetal pole, or cardiac activity yet visualized. Recommend follow-up quantitative B-HCG levels and follow-up US in 14 days to assess viability. This recommendation follows SRU consensus guidelines: Diagnostic Criteria for Nonviable Pregnancy Early  in the First Trimester. Malva Limes Med 2013; 161:0960-45. Electronically Signed   By: Rudie Meyer M.D.   On: 10/03/2022 16:55    Procedures Procedures    Medications Ordered in ED Medications  lactated ringers bolus 1,000 mL (1,000 mLs Intravenous New Bag/Given 10/03/22 1607)  ondansetron (ZOFRAN) injection 4 mg (4 mg Intravenous Given 10/03/22 1608)    ED Course/ Medical Decision Making/ A&P                             Medical Decision Making Amount and/or Complexity of Data Reviewed Labs: ordered. Radiology: ordered.  Risk Prescription drug management.    27 year old G1P1 female who presents to the emergency department with positive home pregnancy test.  She states that her LMP was 08/31/2023.  She has had at least 2 pregnancy test that were positive at home.  She states that yesterday she had some vaginal spotting.  She denies any dysuria or increased urinary frequency.  She endorses some generalized pelvic cramping.  No abnormal vaginal discharge.  She presents with continued uncontrolled NBNB emesis today.  She has been unable to keep anything down.  No fevers or chills.  On arrival, the patient was afebrile, not tachycardic or tachypneic, hemodynamically stable, saturating 99% on room air.  Physical exam significant for mild generalized abdominal tenderness palpation, some component of the right lower quadrant and suprapubic area.  No rebound or guarding.  Overall well-appearing.  Speculum exam performed revealed a closed cervix, no active bleeding.  Differential diagnosis includes ectopic pregnancy, intrauterine pregnancy, nausea and vomiting of pregnancy, lower suspicion for UTI/pyelonephritis, appendicitis.  OB US: FINDINGS:  Intrauterine gestational sac: Present    Yolk sac:  Present    Embryo:  None    Cardiac Activity: N/A    Heart Rate: N/A bpm    MSD: 9.7 mm   5 w   5 d    Subchorionic hemorrhage:  Small    Maternal uterus/adnexae: Both ovaries are normal.     IMPRESSION:  Probable early intrauterine gestational sac and small yolk sac. No  fetal pole, or cardiac activity yet visualized. Recommend follow-up  quantitative B-HCG levels and follow-up US in 14 days to assess  viability. This recommendation follows SRU consensus guidelines:  Diagnostic Criteria for Nonviable Pregnancy Early in the First  Trimester. Malva Limes Med 2013; 161:0960-45.   Patient administered IV fluid bolus and IV Zofran, subsequently was tolerating oral intake and overall well-appearing, without significant pain, cervical os was visualized closed.  Possible intrauterine pregnancy noted at 5 weeks 5 days without cardiac activity noted yet, varies appear to be normal, no evidence of ectopic pregnancy, small subchorionic hemorrhage was present.  Suspect likely nausea and vomiting of pregnancy/hyperemesis gravidarum.  Patient overall stable on repeat assessment, tolerating oral intake, symptomatically improved.  Prescribe Zofran for nausea, advised continued oral rehydration, advise close follow-up with an OB/GYN for recheck of her hCG and repeat ultrasound outpatient.  Referral placed.  Stable for discharge.   Final Clinical Impression(s) / ED Diagnoses Final diagnoses:  Nausea and vomiting of pregnancy, antepartum    Rx / DC Orders ED Discharge Orders          Ordered    ondansetron (ZOFRAN-ODT) 4 MG disintegrating tablet  Every 8 hours PRN        10/03/22 1700              Ernie Avena, MD 10/03/22 1729

## 2022-10-03 NOTE — Discharge Instructions (Addendum)
It is important to follow-up with an OB/GYN as you will need a recheck of an ultrasound and a recheck of your hCG quant.  Return for any severe abdominal pain, uncontrolled nausea or vomiting.  Your cervix was visualized closed, ear ultrasound of the uterus revealed a possible intrauterine pregnancy at 5 weeks: FINDINGS:  Intrauterine gestational sac: Present    Yolk sac:  Present    Embryo:  None    Cardiac Activity: N/A    Heart Rate: N/A bpm    MSD: 9.7 mm   5 w   5 d    Subchorionic hemorrhage:  Small    Maternal uterus/adnexae: Both ovaries are normal.    IMPRESSION:  Probable early intrauterine gestational sac and small yolk sac. No  fetal pole, or cardiac activity yet visualized. Recommend follow-up  quantitative B-HCG levels and follow-up US in 14 days to assess  viability. This recommendation follows SRU consensus guidelines:  Diagnostic Criteria for Nonviable Pregnancy Early in the First  Trimester. Malva Limes Med 2013; 161:0960-45.      Electronically Signed

## 2022-10-03 NOTE — ED Triage Notes (Signed)
Pt arrives to ED with c/o possible pregnancy, emesis, and abdominal cramping. LMP 08/31/23, approx. x2 positive home pregnancy test.

## 2022-10-03 NOTE — ED Notes (Signed)
Discharge instructions, follow up care, and prescriptions reviewed and explained, pt verbalized understanding.  

## 2022-10-08 ENCOUNTER — Encounter (HOSPITAL_BASED_OUTPATIENT_CLINIC_OR_DEPARTMENT_OTHER): Payer: Self-pay | Admitting: Emergency Medicine

## 2022-10-08 ENCOUNTER — Emergency Department (HOSPITAL_BASED_OUTPATIENT_CLINIC_OR_DEPARTMENT_OTHER)
Admission: EM | Admit: 2022-10-08 | Discharge: 2022-10-08 | Disposition: A | Discharge status: Home / Self Care | Payer: Commercial Managed Care - HMO | Attending: Emergency Medicine | Admitting: Emergency Medicine

## 2022-10-08 ENCOUNTER — Emergency Department (HOSPITAL_BASED_OUTPATIENT_CLINIC_OR_DEPARTMENT_OTHER): Payer: Commercial Managed Care - HMO

## 2022-10-08 ENCOUNTER — Other Ambulatory Visit: Payer: Self-pay

## 2022-10-08 DIAGNOSIS — O209 Hemorrhage in early pregnancy, unspecified: Secondary | ICD-10-CM | POA: Diagnosis present

## 2022-10-08 DIAGNOSIS — Z3A01 Less than 8 weeks gestation of pregnancy: Secondary | ICD-10-CM | POA: Diagnosis not present

## 2022-10-08 LAB — CBC
HCT: 41.9 % (ref 36.0–46.0)
Hemoglobin: 14.6 g/dL (ref 12.0–15.0)
MCH: 31.9 pg (ref 26.0–34.0)
MCHC: 34.8 g/dL (ref 30.0–36.0)
MCV: 91.5 fL (ref 80.0–100.0)
Platelets: 169 10*3/uL (ref 150–400)
RBC: 4.58 MIL/uL (ref 3.87–5.11)
RDW: 13 % (ref 11.5–15.5)
WBC: 6.3 10*3/uL (ref 4.0–10.5)
nRBC: 0 % (ref 0.0–0.2)

## 2022-10-08 LAB — COMPREHENSIVE METABOLIC PANEL
ALT: 18 U/L (ref 0–44)
AST: 16 U/L (ref 15–41)
Albumin: 3.9 g/dL (ref 3.5–5.0)
Alkaline Phosphatase: 49 U/L (ref 38–126)
Anion gap: 5 (ref 5–15)
BUN: 6 mg/dL (ref 6–20)
CO2: 25 mmol/L (ref 22–32)
Calcium: 9.2 mg/dL (ref 8.9–10.3)
Chloride: 105 mmol/L (ref 98–111)
Creatinine, Ser: 0.51 mg/dL (ref 0.44–1.00)
GFR, Estimated: >60 mL/min (ref >60)
Glucose, Bld: 102 mg/dL — ABNORMAL HIGH (ref 70–99)
Potassium: 3.8 mmol/L (ref 3.5–5.1)
Sodium: 135 mmol/L (ref 135–145)
Total Bilirubin: 0.7 mg/dL (ref 0.3–1.2)
Total Protein: 6.6 g/dL (ref 6.5–8.1)

## 2022-10-08 LAB — HCG, QUANTITATIVE, PREGNANCY: hCG, Beta Chain, Quant, S: 25391 m[IU]/mL — ABNORMAL HIGH (ref <5)

## 2022-10-08 LAB — ABO/RH: ABO/RH(D): B POS

## 2022-10-08 NOTE — ED Triage Notes (Signed)
Pt [redacted] weeks pregnant, reports some light spotting, then saw darker blood ,,when she voids she sees blood on tissue, not dripping in the toilet. Some cramping also

## 2022-10-08 NOTE — Discharge Instructions (Addendum)
Your ultrasound today showed a single living IUP with EGA of [redacted] weeks 6 days, and [place] estimated due date of 06/04/2023. There was tiny subchorionic hemorrhage noted. I recommend close follow-up with OB-GYN for reevaluation.  Please do not hesitate to return to emergency department if worrisome signs symptoms we discussed become apparent.

## 2022-10-08 NOTE — ED Provider Notes (Signed)
[place] [facility] AT [facility] Provider Note   CSN: [id number] Arrival date & time: 10/08/22  1242     History  No chief complaint on file.   Natasha Beard is a 27 y.o. female with no significant past for history presenting today for evaluation of vaginal bleeding.  Patient states she is [redacted] weeks pregnant.  Stated this morning she started to have vaginal bleeding with some blood clots.  She reports lower abdominal cramping.  She states she was feeling nauseated but no vomiting.  She has an appointment with her OB/GYN in May and June.  States she is not sure if is still bleeding.  HPI   History reviewed. No pertinent past medical history. History reviewed. No pertinent surgical history.   Home Medications Prior to Admission medications   Medication Sig Start Date End Date Taking? Authorizing Provider  albuterol (PROVENTIL HFA;VENTOLIN HFA) 108 (90 Base) MCG/ACT inhaler Inhale 1-2 puffs into the lungs every 6 (six) hours as needed for wheezing or shortness of breath. 07/04/18   Domenick Gong, MD  benzonatate (TESSALON) 200 MG capsule Take 1 capsule (200 mg total) by mouth 3 (three) times daily as needed for cough. 07/04/18   Domenick Gong, MD  chlorpheniramine-HYDROcodone [facility] PENNKINETIC ER) 10-8 MG/5ML SUER Take 5 mLs by mouth every 12 (twelve) hours as needed for cough. 07/04/18   Domenick Gong, MD  cyclobenzaprine (FLEXERIL) 10 MG tablet Take 1 tablet (10 mg total) by mouth 2 (two) times daily as needed for muscle spasms. 01/12/19   Bing Neighbors, NP  dicyclomine (BENTYL) 20 MG tablet Take 1 tablet (20 mg total) by mouth 2 (two) times daily as needed for spasms. 01/13/21   Caccavale, Sophia, PA-C  elvitegravir-cobicistat-emtricitabine-tenofovir (GENVOYA) 150-150-200-10 MG TABS tablet Take 1 tablet by mouth daily with breakfast. 05/08/21   Prosperi, Christian H, PA-C  fluticasone (FLONASE) 50 MCG/ACT nasal spray Place 2 sprays into both nostrils  daily. 07/04/18   Domenick Gong, MD  HYDROcodone-acetaminophen (NORCO/VICODIN) 5-325 MG tablet Take 1 tablet by mouth every 6 (six) hours as needed for severe pain. 04/18/19   Mannie Stabile, PA-C  ibuprofen (ADVIL,MOTRIN) 600 MG tablet Take 1 tablet (600 mg total) by mouth every 6 (six) hours as needed. 07/04/18   Domenick Gong, MD  lidocaine (LMX) 4 % cream Apply 1 application topically as needed. 06/02/20   Joy, Shawn C, PA-C  ondansetron (ZOFRAN-ODT) 4 MG disintegrating tablet Take 1 tablet (4 mg total) by mouth every 8 (eight) hours as needed. 10/03/22   Ernie Avena, MD  Spacer/Aero-Holding Chambers (AEROCHAMBER PLUS) inhaler Use as instructed 07/04/18   Domenick Gong, MD      Allergies    Apple juice and Chocolate    Review of Systems   Review of Systems Negative except as per HPI.  Physical Exam Updated Vital Signs BP 116/66   Pulse 80   Temp 98.9 F (37.2 C) (Oral)   Resp 20   LMP 08/31/2022 (Exact Date) Comment: [redacted] weeks pregnant  SpO2 100%  Physical Exam Vitals and nursing note reviewed.  Constitutional:      Appearance: Normal appearance.  HENT:     Head: Normocephalic and atraumatic.     Mouth/Throat:     Mouth: Mucous membranes are moist.  Eyes:     General: No scleral icterus. Cardiovascular:     Rate and Rhythm: Normal rate and regular rhythm.     Pulses: Normal pulses.     Heart sounds: Normal heart sounds.  Pulmonary:     Effort: Pulmonary effort is normal.     Breath sounds: Normal breath sounds.  Abdominal:     General: Abdomen is flat.     Palpations: Abdomen is soft.     Tenderness: There is abdominal tenderness in the suprapubic area.  Musculoskeletal:        General: No deformity.  Skin:    General: Skin is warm.     Findings: No rash.  Neurological:     General: No focal deficit present.     Mental Status: She is alert.  Psychiatric:        Mood and Affect: Mood normal.     ED Results / Procedures / Treatments    Labs (all labs ordered are listed, but only abnormal results are displayed) Labs Reviewed  COMPREHENSIVE METABOLIC PANEL - Abnormal; Notable for the following components:      Result Value   Glucose, Bld 102 (*)    All other components within normal limits  HCG, QUANTITATIVE, PREGNANCY - Abnormal; Notable for the following components:   hCG, Beta Chain, Quant, S 25,391 (*)    All other components within normal limits  CBC  ABO/RH    EKG None  Radiology US OB LESS THAN 14 WEEKS WITH OB TRANSVAGINAL  Result Date: 10/08/2022 CLINICAL DATA:  Vaginal bleeding and pelvic cramping in 1st trimester pregnancy. EXAM: OBSTETRIC <14 WK [place] AND TRANSVAGINAL OB US TECHNIQUE: Both transabdominal and transvaginal ultrasound examinations were performed for complete evaluation of the gestation as well as the maternal uterus, adnexal regions, and pelvic cul-de-sac. Transvaginal technique was performed to assess early pregnancy. COMPARISON:  10/03/2022 FINDINGS: Intrauterine gestational sac: Single Yolk sac:  Visualized. Embryo:  Visualized. Cardiac Activity: Visualized on cine loops Heart Rate: Unable to measure heart rate on M-mode tracing CRL:  3 mm   5 w   6 d                  [place] EDC: 06/04/2023 Subchorionic hemorrhage:  Tiny subchorionic hemorrhage noted. Maternal uterus/adnexae: Retroflexed uterus. Both ovaries are normal in appearance, with small left ovarian corpus luteum seen. No suspicious adnexal mass or abnormal free fluid identified. IMPRESSION: Single living IUP with EGA of [redacted] weeks 6 days, and [place] EDC of 06/04/2023. Tiny subchorionic hemorrhage noted. Electronically Signed   By: Danae Orleans M.D.   On: 10/08/2022 15:28    Procedures Procedures    Medications Ordered in ED Medications - No data to display  ED Course/ Medical Decision Making/ A&P                             Medical Decision Making Amount and/or Complexity of Data Reviewed Labs: ordered. Radiology: ordered.   This  patient presents to the ED for bleeding in pregnancy, this involves an extensive number of treatment options, and is a complaint that carries with a high risk of complications and morbidity.  The differential diagnosis includes abortion, ectopic pregnancy, trauma, coagulopathy.  This is not an exhaustive list.  Lab tests: I ordered and personally interpreted labs.  The pertinent results include: WBC unremarkable. Hbg unremarkable. Platelets unremarkable. Electrolytes unremarkable. BUN, creatinine unremarkable.  hCG 25,391.  Imaging studies: I ordered imaging studies. I personally reviewed, interpreted imaging and agree with the radiologist's interpretations. The results include: Ultrasound showed a living intrauterine pregnancy with tiny subchorionic bleeding.  Problem list/ ED course/ Critical interventions/ Medical management: HPI: See above Vital  signs within normal range and stable throughout visit. Laboratory/imaging studies significant for: See above. On physical examination, patient is afebrile and appears in no acute distress. This pregnant patient presents with vaginal bleeding in the first trimester. Differential includes ectopic, IUP, threatened/inevitable abortion, along with completed abortion. Patient without a history of coagulopathy or infectious symptoms. Doubt alternate acute emergent pathology. Patient is Rho + so Rho gam is not indicated. Patient with TVUS that showed living intrauterine pregnancy with estimated gestational age of [redacted] weeks 6 days and a tiny subchorionic bleeding.  Patient is hemodynamically stable at this point.  CBC with no evidence of anemia.  Blood pressure is normal.  States she went to the restroom and seemed like the bleeding has stopped.  Advised patient to follow-up with OB/GYN for further evaluation and management.  Strict return precaution given.  I have reviewed the patient home medicines and have made adjustments as needed.  Cardiac  monitoring/EKG: The patient was maintained on a cardiac monitor.  I personally reviewed and interpreted the cardiac monitor which showed an underlying rhythm of: sinus rhythm.  Additional history obtained: External records from outside source obtained and reviewed including: Chart review including previous notes, labs, imaging.  Consultations obtained:  Disposition Continued outpatient therapy. Follow-up with OB-GYN recommended for reevaluation of symptoms. Treatment plan discussed with patient.  Pt acknowledged understanding was agreeable to the plan. Worrisome signs and symptoms were discussed with patient, and patient acknowledged understanding to return to the ED if they noticed these signs and symptoms. Patient was stable upon discharge.   This chart was dictated using voice recognition software.  Despite best efforts to proofread,  errors can occur which can change the documentation meaning.          Final Clinical Impression(s) / ED Diagnoses Final diagnoses:  Bleeding in early pregnancy    Rx / DC Orders ED Discharge Orders     None         Jeanelle Malling, [place] 10/08/22 1640    Rexford Maus, DO 10/11/22 [phone]

## 2022-10-08 NOTE — ED Notes (Signed)
Pt reports vaginal spotting on tissue after urinating. Mild cramping. Pt given water and socks.

## 2022-10-10 ENCOUNTER — Other Ambulatory Visit: Payer: Self-pay

## 2022-10-10 ENCOUNTER — Encounter (HOSPITAL_BASED_OUTPATIENT_CLINIC_OR_DEPARTMENT_OTHER): Payer: Self-pay

## 2022-10-10 ENCOUNTER — Emergency Department (HOSPITAL_BASED_OUTPATIENT_CLINIC_OR_DEPARTMENT_OTHER)
Admission: EM | Admit: 2022-10-10 | Discharge: 2022-10-10 | Disposition: A | Discharge status: Home / Self Care | Payer: Commercial Managed Care - HMO | Attending: Emergency Medicine | Admitting: Emergency Medicine

## 2022-10-10 DIAGNOSIS — O219 Vomiting of pregnancy, unspecified: Secondary | ICD-10-CM | POA: Insufficient documentation

## 2022-10-10 DIAGNOSIS — R109 Unspecified abdominal pain: Secondary | ICD-10-CM | POA: Insufficient documentation

## 2022-10-10 DIAGNOSIS — Z3A01 Less than 8 weeks gestation of pregnancy: Secondary | ICD-10-CM | POA: Diagnosis not present

## 2022-10-10 LAB — BASIC METABOLIC PANEL
Anion gap: 9 (ref 5–15)
BUN: 5 mg/dL — ABNORMAL LOW (ref 6–20)
CO2: 22 mmol/L (ref 22–32)
Calcium: 9 mg/dL (ref 8.9–10.3)
Chloride: 104 mmol/L (ref 98–111)
Creatinine, Ser: 0.48 mg/dL (ref 0.44–1.00)
GFR, Estimated: >60 mL/min (ref >60)
Glucose, Bld: 100 mg/dL — ABNORMAL HIGH (ref 70–99)
Potassium: 3.7 mmol/L (ref 3.5–5.1)
Sodium: 135 mmol/L (ref 135–145)

## 2022-10-10 LAB — CBC WITH DIFFERENTIAL/PLATELET
Abs Immature Granulocytes: 0.01 10*3/uL (ref 0.00–0.07)
Basophils Absolute: 0 10*3/uL (ref 0.0–0.1)
Basophils Relative: 0 %
Eosinophils Absolute: 0 10*3/uL (ref 0.0–0.5)
Eosinophils Relative: 1 %
HCT: 41.9 % (ref 36.0–46.0)
Hemoglobin: 14.3 g/dL (ref 12.0–15.0)
Immature Granulocytes: 0 %
Lymphocytes Relative: 21 %
Lymphs Abs: 1.2 10*3/uL (ref 0.7–4.0)
MCH: 31.4 pg (ref 26.0–34.0)
MCHC: 34.1 g/dL (ref 30.0–36.0)
MCV: 92.1 fL (ref 80.0–100.0)
Monocytes Absolute: 0.6 10*3/uL (ref 0.1–1.0)
Monocytes Relative: 12 %
Neutro Abs: 3.7 10*3/uL (ref 1.7–7.7)
Neutrophils Relative %: 66 %
Platelets: 154 10*3/uL (ref 150–400)
RBC: 4.55 MIL/uL (ref 3.87–5.11)
RDW: 13.2 % (ref 11.5–15.5)
WBC: 5.5 10*3/uL (ref 4.0–10.5)
nRBC: 0 % (ref 0.0–0.2)

## 2022-10-10 LAB — URINALYSIS, ROUTINE W REFLEX MICROSCOPIC
Bilirubin Urine: NEGATIVE
Glucose, UA: NEGATIVE mg/dL
Hgb urine dipstick: NEGATIVE
Ketones, ur: 15 mg/dL — AB
Leukocytes,Ua: NEGATIVE
Nitrite: NEGATIVE
Specific Gravity, Urine: 1.021 (ref 1.005–1.030)
pH: 7 (ref 5.0–8.0)

## 2022-10-10 LAB — HCG, QUANTITATIVE, PREGNANCY: hCG, Beta Chain, Quant, S: 43906 m[IU]/mL — ABNORMAL HIGH (ref <5)

## 2022-10-10 MED ORDER — ONDANSETRON HCL 4 MG/2ML IJ SOLN
4.0000 mg | Freq: Once | INTRAMUSCULAR | Status: AC
  Administered 2022-10-10: 4 mg via INTRAVENOUS
  Filled 2022-10-10: qty 2

## 2022-10-10 MED ORDER — SODIUM CHLORIDE 0.9 % IV BOLUS
1000.0000 mL | Freq: Once | INTRAVENOUS | Status: AC
  Administered 2022-10-10: 1000 mL via INTRAVENOUS

## 2022-10-10 MED ORDER — DOXYLAMINE-PYRIDOXINE 10-10 MG PO TBEC
1.0000 | DELAYED_RELEASE_TABLET | Freq: Three times a day (TID) | ORAL | 0 refills | Status: AC | PRN
Start: 2022-10-10 — End: 2022-10-30

## 2022-10-10 NOTE — ED Provider Notes (Signed)
[place] EMERGENCY DEPARTMENT AT [facility] Provider Note   CSN: [id number] Arrival date & time: 10/10/22  1716     History  Chief Complaint  Patient presents with   Emesis During Pregnancy    Natasha Beard is a 27 y.o. female.  Presenting to the ED for evaluation of nausea and vomiting.  She reports being approximately 6 months pregnant.  She has continuous nausea and postprandial vomiting.  This began yesterday morning and has progressed through today.  Vomit is nonbloody and nonbilious.  She reports persistent and mild lower abdominal cramping.  She was seen here 2 days ago for vaginal bleeding in pregnancy and was found to have a subchorionic hemorrhage.  She has not had any vaginal bleeding since that time.  She denies chest pain, shortness of breath, urinary symptoms, fevers.  She was prescribed Zofran for home but states that her family did some research and found that it may cause miscarriages so she has not used this.  She used ginger root and ginger ale with no improvement.  HPI     Home Medications Prior to Admission medications   Medication Sig Start Date End Date Taking? Authorizing Provider  Doxylamine-Pyridoxine 10-10 MG TBEC Take 1 tablet by mouth 3 (three) times daily as needed for up to 20 days. 10/10/22 10/30/22 Yes Jamy Cleckler, Edsel Petrin, PA-C  albuterol (PROVENTIL HFA;VENTOLIN HFA) 108 (90 Base) MCG/ACT inhaler Inhale 1-2 puffs into the lungs every 6 (six) hours as needed for wheezing or shortness of breath. 07/04/18   Domenick Gong, MD  benzonatate (TESSALON) 200 MG capsule Take 1 capsule (200 mg total) by mouth 3 (three) times daily as needed for cough. 07/04/18   Domenick Gong, MD  chlorpheniramine-HYDROcodone [facility] PENNKINETIC ER) 10-8 MG/5ML SUER Take 5 mLs by mouth every 12 (twelve) hours as needed for cough. 07/04/18   Domenick Gong, MD  cyclobenzaprine (FLEXERIL) 10 MG tablet Take 1 tablet (10 mg total) by mouth 2 (two) times daily as needed  for muscle spasms. 01/12/19   Bing Neighbors, NP  dicyclomine (BENTYL) 20 MG tablet Take 1 tablet (20 mg total) by mouth 2 (two) times daily as needed for spasms. 01/13/21   Caccavale, Sophia, PA-C  elvitegravir-cobicistat-emtricitabine-tenofovir (GENVOYA) 150-150-200-10 MG TABS tablet Take 1 tablet by mouth daily with breakfast. 05/08/21   Prosperi, Christian H, PA-C  fluticasone (FLONASE) 50 MCG/ACT nasal spray Place 2 sprays into both nostrils daily. 07/04/18   Domenick Gong, MD  HYDROcodone-acetaminophen (NORCO/VICODIN) 5-325 MG tablet Take 1 tablet by mouth every 6 (six) hours as needed for severe pain. 04/18/19   Mannie Stabile, PA-C  ibuprofen (ADVIL,MOTRIN) 600 MG tablet Take 1 tablet (600 mg total) by mouth every 6 (six) hours as needed. 07/04/18   Domenick Gong, MD  lidocaine (LMX) 4 % cream Apply 1 application topically as needed. 06/02/20   Joy, Shawn C, PA-C  ondansetron (ZOFRAN-ODT) 4 MG disintegrating tablet Take 1 tablet (4 mg total) by mouth every 8 (eight) hours as needed. 10/03/22   Ernie Avena, MD  Spacer/Aero-Holding Chambers (AEROCHAMBER PLUS) inhaler Use as instructed 07/04/18   Domenick Gong, MD      Allergies    Apple juice and Chocolate    Review of Systems   Review of Systems  Gastrointestinal:  Positive for nausea and vomiting.  All other systems reviewed and are negative.   Physical Exam Updated Vital Signs BP 128/72 (BP Location: Right Arm)   Pulse 74   Temp 98.3 F (36.8 C) (  Oral)   Resp 18   Ht 5\' 7"  (1.702 m)   Wt 87.5 kg   LMP 08/31/2022 (Exact Date) Comment: [redacted] weeks pregnant  SpO2 100%   BMI 30.23 kg/m  Physical Exam Vitals and nursing note reviewed.  Constitutional:      General: She is not in acute distress.    Appearance: Normal appearance. She is well-developed. She is not ill-appearing, toxic-appearing or diaphoretic.     Comments: Resting comfortably in bed  HENT:     Head: Normocephalic and atraumatic.      Mouth/Throat:     Mouth: Mucous membranes are moist.     Pharynx: Oropharynx is clear.  Eyes:     Conjunctiva/sclera: Conjunctivae normal.  Cardiovascular:     Rate and Rhythm: Normal rate and regular rhythm.     Heart sounds: No murmur heard. Pulmonary:     Effort: Pulmonary effort is normal. No respiratory distress.     Breath sounds: Normal breath sounds. No wheezing or rales.  Abdominal:     Palpations: Abdomen is soft.     Tenderness: There is no abdominal tenderness. There is no guarding.  Musculoskeletal:        General: No swelling.     Cervical back: Neck supple.     Right lower leg: No edema.     Left lower leg: No edema.  Skin:    General: Skin is warm and dry.     Capillary Refill: Capillary refill takes less than 2 seconds.  Neurological:     General: No focal deficit present.     Mental Status: She is alert and oriented to person, place, and time.  Psychiatric:        Mood and Affect: Mood normal.     ED Results / Procedures / Treatments   Labs (all labs ordered are listed, but only abnormal results are displayed) Labs Reviewed  URINALYSIS, ROUTINE W REFLEX MICROSCOPIC - Abnormal; Notable for the following components:      Result Value   Ketones, ur 15 (*)    Protein, ur TRACE (*)    All other components within normal limits  BASIC METABOLIC PANEL - Abnormal; Notable for the following components:   Glucose, Bld 100 (*)    BUN 5 (*)    All other components within normal limits  HCG, QUANTITATIVE, PREGNANCY - Abnormal; Notable for the following components:   hCG, Beta Chain, Quant, S 43,906 (*)    All other components within normal limits  CBC WITH DIFFERENTIAL/PLATELET    EKG None  Radiology No results found.  Procedures Procedures    Medications Ordered in ED Medications  sodium chloride 0.9 % bolus 1,000 mL (0 mLs Intravenous Stopped 10/10/22 2203)  ondansetron (ZOFRAN) injection 4 mg (4 mg Intravenous Given 10/10/22 1910)    ED Course/  Medical Decision Making/ A&P                             Medical Decision Making Amount and/or Complexity of Data Reviewed Labs: ordered.  Risk Prescription drug management.  This patient presents to the ED for concern of nausea and vomiting in pregnancy, this involves an extensive number of treatment options, and is a complaint that carries with it a high risk of complications and morbidity.  The emergent differential diagnosis for vomiting includes, but is not limited to ACS/MI, DKA, Ischemic bowel, Meningitis, Sepsis, Acute gastric dilation, Adrenal insufficiency, Appendicitis,  Bowel obstruction/ileus,  Carbon monoxide poisoning, Cholecystitis, Electrolyte abnormalities, Elevated ICP, Gastric outlet obstruction, Pancreatitis, Ruptured viscus, Biliary colic, Cannabinoid hyperemesis syndrome, Gastritis, Gastroenteritis, Gastroparesis,  Narcotic withdrawal, Peptic ulcer disease, and UTI   Co morbidities that complicate the patient evaluation   current pregnancy  My initial workup includes labs, IV fluids, Zofran  Additional history obtained from: Nursing notes from this visit.  I ordered, reviewed and interpreted labs which include: CBC, BMP, quantitative hCG, urinalysis.  Urinalysis with trace protein.  hCG nearly doubled from 2 days prior.  Labs otherwise reassuring  Afebrile, hemodynamically stable.  27 year old female presenting to the ED for evaluation of nausea and vomiting of pregnancy.  She has not been using the Zofran at home for concern of a miscarriage.  She has been trying ginger supplements with no relief.  In the ED she appears overall very well.  Her mucous membranes are moist.  She received fluids and Zofran and reported significant improvement in her symptoms.  She passed her p.o. challenge without difficulty.  Her lab workup was overall reassuring.  Her urine did show trace protein.  She was encouraged to follow-up with her OB provider regarding this lab value.  She does  not have any vaginal bleeding today and was seen and diagnosed with a subchorionic hemorrhage 2 days ago.  Do not believe repeat imaging is indicated at this time.  She does have an appointment scheduled with OB.  She was encouraged to keep this appointment or schedule a sooner 1 if possible.  She was also sent a prescription for diclegis.  She was given return precautions.  Stable at discharge.  At this time there does not appear to be any evidence of an acute emergency medical condition and the patient appears stable for discharge with appropriate outpatient follow up. Diagnosis was discussed with patient who verbalizes understanding of care plan and is agreeable to discharge. I have discussed return precautions with patient who verbalizes understanding. Patient encouraged to follow-up with their PCP within 1 week. All questions answered.  Patient's case discussed with Dr. Karene Fry who agrees with plan to discharge with follow-up.   Note: Portions of this report may have been transcribed using voice recognition software. Every effort was made to ensure accuracy; however, inadvertent computerized transcription errors may still be present.        Final Clinical Impression(s) / ED Diagnoses Final diagnoses:  Nausea and vomiting in pregnancy prior to [redacted] weeks gestation    Rx / DC Orders ED Discharge Orders          Ordered    Doxylamine-Pyridoxine 10-10 MG TBEC  3 times daily PRN        10/10/22 2236              Mora Bellman 10/10/22 2250    Ernie Avena, MD 10/10/22 [phone]

## 2022-10-10 NOTE — Discharge Instructions (Signed)
You have been seen today for your complaint of nausea and vomiting. Your lab work showed routine in your urine but is otherwise reassuring. Your discharge medications include Zofran.  You have this medication at home.  It is safe to take in pregnancy if needed.  I am also sending her prescription for Diclegis.  This is another medicine that is proven safe in pregnancy.  Follow dosing instructions on the packaging. Follow up with: Your OB provider as soon as possible if your symptoms do not improve Please seek immediate medical care if you develop any of the following symptoms: You have pain in your chest, neck, arm, or jaw. You feel very weak or you faint. You vomit again and again. You have vomit that is bright red or looks like black coffee grounds. You have bloody or black poop (stools) or poop that looks like tar. You have a very bad headache, a stiff neck, or both. You have very bad pain, cramping, or bloating in your belly (abdomen). You have trouble breathing. You are breathing very quickly. Your heart is beating very quickly. Your skin feels cold and clammy. You feel confused. You have signs of losing too much water in your body, such as: Dark pee, very little pee, or no pee. Cracked lips. Dry mouth. Sunken eyes. Sleepiness. Weakness. At this time there does not appear to be the presence of an emergent medical condition, however there is always the potential for conditions to change. Please read and follow the below instructions.  Do not take your medicine if  develop an itchy rash, swelling in your mouth or lips, or difficulty breathing; call 911 and seek immediate emergency medical attention if this occurs.  You may review your lab tests and imaging results in their entirety on your MyChart account.  Please discuss all results of fully with your primary care provider and other specialist at your follow-up visit.  Note: Portions of this text may have been transcribed using  voice recognition software. Every effort was made to ensure accuracy; however, inadvertent computerized transcription errors may still be present.

## 2022-10-10 NOTE — ED Triage Notes (Signed)
Patient here POV from Home.  Endorses N/V that began yesterday. Currently [redacted] Weeks Pregnant. Seen for Bleeding/Spotting 2 Days ago. No Diarrhea. No fevers.   NAD noted during Triage. A&Ox4. GCS 15. Ambulatory.

## 2022-10-30 ENCOUNTER — Telehealth: Payer: Commercial Managed Care - HMO | Admitting: Nurse Practitioner

## 2022-10-30 ENCOUNTER — Emergency Department (HOSPITAL_BASED_OUTPATIENT_CLINIC_OR_DEPARTMENT_OTHER)
Admission: EM | Admit: 2022-10-30 | Discharge: 2022-10-30 | Disposition: A | Discharge status: Home / Self Care | Payer: Commercial Managed Care - HMO | Attending: Emergency Medicine | Admitting: Emergency Medicine

## 2022-10-30 ENCOUNTER — Encounter (HOSPITAL_BASED_OUTPATIENT_CLINIC_OR_DEPARTMENT_OTHER): Payer: Self-pay

## 2022-10-30 ENCOUNTER — Emergency Department (HOSPITAL_BASED_OUTPATIENT_CLINIC_OR_DEPARTMENT_OTHER): Payer: Commercial Managed Care - HMO | Admitting: Radiology

## 2022-10-30 ENCOUNTER — Other Ambulatory Visit: Payer: Self-pay

## 2022-10-30 DIAGNOSIS — Z1152 Encounter for screening for COVID-19: Secondary | ICD-10-CM | POA: Diagnosis not present

## 2022-10-30 DIAGNOSIS — R059 Cough, unspecified: Secondary | ICD-10-CM | POA: Diagnosis present

## 2022-10-30 DIAGNOSIS — R0981 Nasal congestion: Secondary | ICD-10-CM | POA: Diagnosis not present

## 2022-10-30 DIAGNOSIS — R062 Wheezing: Secondary | ICD-10-CM | POA: Insufficient documentation

## 2022-10-30 DIAGNOSIS — R051 Acute cough: Secondary | ICD-10-CM | POA: Diagnosis not present

## 2022-10-30 DIAGNOSIS — Z3A08 8 weeks gestation of pregnancy: Secondary | ICD-10-CM | POA: Diagnosis not present

## 2022-10-30 DIAGNOSIS — J029 Acute pharyngitis, unspecified: Secondary | ICD-10-CM | POA: Insufficient documentation

## 2022-10-30 DIAGNOSIS — J209 Acute bronchitis, unspecified: Secondary | ICD-10-CM

## 2022-10-30 LAB — RESP PANEL BY RT-PCR (RSV, FLU A&B, COVID)  RVPGX2
Influenza A by PCR: NEGATIVE
Influenza B by PCR: NEGATIVE
Resp Syncytial Virus by PCR: NEGATIVE
SARS Coronavirus 2 by RT PCR: NEGATIVE

## 2022-10-30 LAB — GROUP A STREP BY PCR: Group A Strep by PCR: NOT DETECTED

## 2022-10-30 MED ORDER — ALBUTEROL SULFATE HFA 108 (90 BASE) MCG/ACT IN AERS
2.0000 | INHALATION_SPRAY | Freq: Once | RESPIRATORY_TRACT | Status: AC
  Administered 2022-10-30: 2 via RESPIRATORY_TRACT
  Filled 2022-10-30: qty 6.7

## 2022-10-30 MED ORDER — AEROCHAMBER PLUS FLO-VU MEDIUM MISC
1.0000 | Freq: Once | Status: DC
  Filled 2022-10-30: qty 10

## 2022-10-30 MED ORDER — IPRATROPIUM-ALBUTEROL 0.5-2.5 (3) MG/3ML IN SOLN
3.0000 mL | Freq: Once | RESPIRATORY_TRACT | Status: AC
  Administered 2022-10-30: 3 mL via RESPIRATORY_TRACT
  Filled 2022-10-30: qty 3

## 2022-10-30 NOTE — Progress Notes (Signed)
Virtual Visit Consent   Natasha Beard, you are scheduled for a virtual visit with a [facility] provider today. Just as with appointments in the office, your consent must be obtained to participate. Your consent will be active for this visit and any virtual visit you may have with one of our providers in the next 365 days. If you have a MyChart account, a copy of this consent can be sent to you electronically.  As this is a virtual visit, video technology does not allow for your provider to perform a traditional examination. This may limit your provider's ability to fully assess your condition. If your provider identifies any concerns that need to be evaluated in person or the need to arrange testing (such as labs, EKG, etc.), we will make arrangements to do so. Although advances in technology are sophisticated, we cannot ensure that it will always work on either your end or our end. If the connection with a video visit is poor, the visit may have to be switched to a telephone visit. With either a video or telephone visit, we are not always able to ensure that we have a secure connection.  By engaging in this virtual visit, you consent to the provision of healthcare and authorize for your insurance to be billed (if applicable) for the services provided during this visit. Depending on your insurance coverage, you may receive a charge related to this service.  I need to obtain your verbal consent now. Are you willing to proceed with your visit today? Natasha Beard has provided verbal consent on 10/30/2022 for a virtual visit (video or telephone). [name], FNP  Date: 10/30/2022 11:29 AM  Virtual Visit via Video Note   I, [name], connected with  Natasha Beard  ([id number], [birth year]) on 10/30/22 at 11:30 AM EDT by a video-enabled telemedicine application and verified that I am speaking with the correct person using two identifiers.  Location: Patient: Virtual Visit Location Patient: Home Provider:  Virtual Visit Location Provider: Home Office   I discussed the limitations of evaluation and management by telemedicine and the availability of in person appointments. The patient expressed understanding and agreed to proceed.    History of Present Illness: Natasha Beard is a 27 y.o. who identifies as a female who was assigned female at birth, and is being seen today for tightness in chest.\  Dry cough that is blood tinged at times  Symptom onset was 2-3 days ago  No fevers  She does not have much of a runny nose or nasal congestion  No bloody nose She can take a deep breath but this does cause discomfort in the chest with tightness  Denies a history of asthma  She did need an inhaler two years ago when she was sick with URI not COVID per patient   She is [redacted] weeks pregnant  Her first OBGYN appointment is 11/07/22    Problems: There are no problems to display for this patient.   Allergies:  Allergies  Allergen Reactions   Apple Juice    Chocolate     In moderation   Medications:  Current Outpatient Medications:    albuterol (PROVENTIL HFA;VENTOLIN HFA) 108 (90 Base) MCG/ACT inhaler, Inhale 1-2 puffs into the lungs every 6 (six) hours as needed for wheezing or shortness of breath., Disp: 1 Inhaler, Rfl: 0   benzonatate (TESSALON) 200 MG capsule, Take 1 capsule (200 mg total) by mouth 3 (three) times daily as needed for cough., Disp: 30 capsule, Rfl: 0  chlorpheniramine-HYDROcodone (TUSSIONEX PENNKINETIC ER) 10-8 MG/5ML SUER, Take 5 mLs by mouth every 12 (twelve) hours as needed for cough., Disp: 60 mL, Rfl: 0   cyclobenzaprine (FLEXERIL) 10 MG tablet, Take 1 tablet (10 mg total) by mouth 2 (two) times daily as needed for muscle spasms., Disp: 20 tablet, Rfl: 0   dicyclomine (BENTYL) 20 MG tablet, Take 1 tablet (20 mg total) by mouth 2 (two) times daily as needed for spasms., Disp: 14 tablet, Rfl: 0   Doxylamine-Pyridoxine 10-10 MG TBEC, Take 1 tablet by mouth 3 (three) times daily as  needed for up to 20 days., Disp: 60 tablet, Rfl: 0   elvitegravir-cobicistat-emtricitabine-tenofovir (GENVOYA) 150-150-200-10 MG TABS tablet, Take 1 tablet by mouth daily with breakfast., Disp: 30 tablet, Rfl: 0   fluticasone (FLONASE) 50 MCG/ACT nasal spray, Place 2 sprays into both nostrils daily., Disp: 16 g, Rfl: 0   HYDROcodone-acetaminophen (NORCO/VICODIN) 5-325 MG tablet, Take 1 tablet by mouth every 6 (six) hours as needed for severe pain., Disp: 8 tablet, Rfl: 0   ibuprofen (ADVIL,MOTRIN) 600 MG tablet, Take 1 tablet (600 mg total) by mouth every 6 (six) hours as needed., Disp: 30 tablet, Rfl: 0   lidocaine (LMX) 4 % cream, Apply 1 application topically as needed., Disp: 30 g, Rfl: 0   ondansetron (ZOFRAN-ODT) 4 MG disintegrating tablet, Take 1 tablet (4 mg total) by mouth every 8 (eight) hours as needed., Disp: 20 tablet, Rfl: 0   Spacer/Aero-Holding Chambers (AEROCHAMBER PLUS) inhaler, Use as instructed, Disp: 1 each, Rfl: 2  Observations/Objective: Patient is well-developed, well-nourished in no acute distress.  Resting comfortably  at home.  Head is normocephalic, atraumatic.  No labored breathing.  Speech is clear and coherent with logical content.  Patient is alert and oriented at baseline.    Assessment and Plan: 1. Wheezing  2. Acute cough  3. [redacted] weeks gestation of pregnancy Due to  tightness in chest without history of asthma and associated URI symptoms advised UC for vitals/lung assessment prior to plan .   Patient is agreeable and will seek in person evaluation today       Follow Up Instructions: I discussed the assessment and treatment plan with the patient. The patient was provided an opportunity to ask questions and all were answered. The patient agreed with the plan and demonstrated an understanding of the instructions.  A copy of instructions were sent to the patient via MyChart unless otherwise noted below.    The patient was advised to call back or seek an  in-person evaluation if the symptoms worsen or if the condition fails to improve as anticipated.  Time:  I spent 15 minutes with the patient via telehealth technology discussing the above problems/concerns.    [name], FNP

## 2022-10-30 NOTE — Patient Instructions (Signed)
    NOTE: There will be NO CHARGE for this eVisit   If you are having a true medical emergency please call 911.      For an urgent face to face visit, [place] has eight urgent care centers for your convenience:   NEW!! [facility] at [facility] Get Driving Directions [phone] [address] [place], [address]    [facility] at [facility] Get Driving Directions [phone] [address] [place], [place] [address]   [facility] Health Urgent Care Center [facility]) Get Driving Directions [phone] [address] [place], [place] [address]  [facility] Health Urgent Care Center [facility]) Get Driving Directions [phone] [address] [place],  [place]  [address]  [facility] Health Urgent Care Center [facility] - at [employer]  [phone] [address] [place],  [place] [address]   [facility] Health Urgent Care at [facility] Get Driving Directions [phone] [address] [place], [place] [address]   [facility] Health Urgent Care at [facility] Get Driving Directions  [phone] [address] [place], [place] [address]   [facility] Health Urgent Care at [facility] Directions [phone] [address] [place], [place] [address]  Your MyChart E-visit questionnaire answers were reviewed by a board certified advanced clinical practitioner to complete your personal care plan based on your specific symptoms.  Thank you for using e-Visits.

## 2022-10-30 NOTE — ED Triage Notes (Signed)
Patient here POV from Home.  Endorses Congestion that began 3 Days ago. Began to have a Dry Cough and Sore Throat more recently. No Fevers at Home.  NAD Noted during Triage. A&Ox4. GCS 15. Ambulatory.

## 2022-10-30 NOTE — Discharge Instructions (Addendum)
Take 1-2 puffs of albuterol every 4 hours as needed for coughing and wheezing. Contact a health care provider if: Your symptoms do not improve after 2 weeks. You have trouble coughing up the mucus. Your cough keeps you awake at night. You have a fever. Get help right away if you: Cough up blood clots. Feel pain in your chest. Have severe shortness of breath. Faint or keep feeling like you are going to faint. Have a severe headache. Have a fever or chills that get worse. These symptoms may represent a serious problem that is an emergency. Do not wait to see if the symptoms will go away. Get medical help right away. Call your local emergency services (911 in the [place] Do not drive yourself to the hospital.

## 2022-10-30 NOTE — ED Provider Notes (Signed)
[place] EMERGENCY DEPARTMENT AT [facility] Provider Note   CSN: [id number] Arrival date & time: 10/30/22  1218     History  Chief Complaint  Patient presents with   Congestion    Natasha Beard is a 27 y.o. female who is [redacted] weeks pregnant who presents emergency department with chief complaint of cough.  She reports 3 to 4 days of URI symptoms including runny nose, sore throat, then progressing into her chest.  She has been coughing.  She awoke this morning and coughed up a lot of phlegm from her throat and noticed a little bit of streaky blood.  Since that time she has had no other episodes of blood.  She denies chest pain, shortness of breath, frank hemoptysis.  She reports having to use an inhaler once before when she had a URI.  HPI     Home Medications Prior to Admission medications   Medication Sig Start Date End Date Taking? Authorizing Provider  albuterol (PROVENTIL HFA;VENTOLIN HFA) 108 (90 Base) MCG/ACT inhaler Inhale 1-2 puffs into the lungs every 6 (six) hours as needed for wheezing or shortness of breath. 07/04/18   Domenick Gong, MD  benzonatate (TESSALON) 200 MG capsule Take 1 capsule (200 mg total) by mouth 3 (three) times daily as needed for cough. 07/04/18   Domenick Gong, MD  chlorpheniramine-HYDROcodone [facility] PENNKINETIC ER) 10-8 MG/5ML SUER Take 5 mLs by mouth every 12 (twelve) hours as needed for cough. 07/04/18   Domenick Gong, MD  cyclobenzaprine (FLEXERIL) 10 MG tablet Take 1 tablet (10 mg total) by mouth 2 (two) times daily as needed for muscle spasms. 01/12/19   Bing Neighbors, NP  dicyclomine (BENTYL) 20 MG tablet Take 1 tablet (20 mg total) by mouth 2 (two) times daily as needed for spasms. 01/13/21   Caccavale, Sophia, PA-C  Doxylamine-Pyridoxine 10-10 MG TBEC Take 1 tablet by mouth 3 (three) times daily as needed for up to 20 days. 10/10/22 10/30/22  Schutt, Edsel Petrin, PA-C  elvitegravir-cobicistat-emtricitabine-tenofovir (GENVOYA)  150-150-200-10 MG TABS tablet Take 1 tablet by mouth daily with breakfast. 05/08/21   Prosperi, Christian H, PA-C  fluticasone (FLONASE) 50 MCG/ACT nasal spray Place 2 sprays into both nostrils daily. 07/04/18   Domenick Gong, MD  HYDROcodone-acetaminophen (NORCO/VICODIN) 5-325 MG tablet Take 1 tablet by mouth every 6 (six) hours as needed for severe pain. 04/18/19   Mannie Stabile, PA-C  ibuprofen (ADVIL,MOTRIN) 600 MG tablet Take 1 tablet (600 mg total) by mouth every 6 (six) hours as needed. 07/04/18   Domenick Gong, MD  lidocaine (LMX) 4 % cream Apply 1 application topically as needed. 06/02/20   Joy, Shawn C, PA-C  ondansetron (ZOFRAN-ODT) 4 MG disintegrating tablet Take 1 tablet (4 mg total) by mouth every 8 (eight) hours as needed. 10/03/22   Ernie Avena, MD  Spacer/Aero-Holding Chambers (AEROCHAMBER PLUS) inhaler Use as instructed 07/04/18   Domenick Gong, MD      Allergies    Apple juice and Chocolate    Review of Systems   Review of Systems  Physical Exam Updated Vital Signs BP 127/86 (BP Location: Right Arm)   Pulse 76   Temp 98.1 F (36.7 C) (Oral)   Resp 16   Ht 5\' 7"  (1.702 m)   Wt 85.7 kg   LMP 08/31/2022 (Exact Date) Comment: [redacted] weeks pregnant  SpO2 100%   BMI 29.60 kg/m  Physical Exam Vitals and nursing note reviewed.  Constitutional:      General: She is not  in acute distress.    Appearance: She is well-developed. She is not diaphoretic.  HENT:     Head: Normocephalic and atraumatic.     Right Ear: External ear normal.     Left Ear: External ear normal.     Nose: Nose normal.     Mouth/Throat:     Mouth: Mucous membranes are moist.  Eyes:     General: No scleral icterus.    Conjunctiva/sclera: Conjunctivae normal.  Cardiovascular:     Rate and Rhythm: Normal rate and regular rhythm.     Heart sounds: Normal heart sounds. No murmur heard.    No friction rub. No gallop.  Pulmonary:     Effort: Pulmonary effort is normal. No respiratory  distress.     Breath sounds: Wheezing present.  Abdominal:     General: Bowel sounds are normal. There is no distension.     Palpations: Abdomen is soft. There is no mass.     Tenderness: There is no abdominal tenderness. There is no guarding.     Comments: Gravid abdomen  Musculoskeletal:     Cervical back: Normal range of motion.     Right lower leg: No edema.     Left lower leg: No edema.  Skin:    General: Skin is warm and dry.  Neurological:     Mental Status: She is alert and oriented to person, place, and time.  Psychiatric:        Behavior: Behavior normal.     ED Results / Procedures / Treatments   Labs (all labs ordered are listed, but only abnormal results are displayed) Labs Reviewed  GROUP A STREP BY PCR  RESP PANEL BY RT-PCR (RSV, FLU A&B, COVID)  [id number]    EKG None  Radiology DG Chest 2 View  Result Date: 10/30/2022 CLINICAL DATA:  Wheezing with cough for 3 days. First trimester pregnancy. EXAM: CHEST - 2 VIEW COMPARISON:  None Available. FINDINGS: The heart size and mediastinal contours are normal. The lungs are clear. There is no pleural effusion or pneumothorax. No acute osseous findings are identified. IMPRESSION: No active cardiopulmonary process. Electronically Signed   By: Carey Bullocks M.D.   On: 10/30/2022 14:19    Procedures Procedures    Medications Ordered in ED Medications  albuterol (VENTOLIN HFA) 108 (90 Base) MCG/ACT inhaler 2 puff (has no administration in time range)  AeroChamber Plus Flo-Vu Medium MISC 1 each (has no administration in time range)  ipratropium-albuterol (DUONEB) 0.5-2.5 (3) MG/3ML nebulizer solution 3 mL (3 mLs Nebulization Given 10/30/22 1347)    ED Course/ Medical Decision Making/ A&P Clinical Course as of 10/30/22 1501  Mon Oct 30, 2022  1314 Resp panel by RT-PCR (RSV, Flu A&B, Covid) Anterior Nasal Swab [AH]  1314 Group A Strep by PCR [AH]  1459 DG Chest 2 View [AH]    Clinical Course User Index [AH] Arthor Captain, PA-C                             Medical Decision Making 27 year old female who presents emergency department with cough and URI symptoms. Differential diagnosis for emergent cause of cough includes but is not limited to upper respiratory infection, lower respiratory infection, allergies, asthma, irritants, foreign body, medications such as ACE inhibitors, reflux, asthma, CHF, lung cancer, interstitial lung disease, psychiatric causes, postnasal drip and postinfectious bronchospasm.   She had a little bit of streaky blood with her first Mindi Junker  rated sputum this morning.  Likely from the upper airway such as his throat and nose is she does have some inflammation in those regions.  She has been normotensive without tachycardia, hypotension or other signs of send or symptoms of pulmonary embolus and I do not believe this hemoptysis represents PE.  On physical examination patient was wheezing, got a DuoNeb and has greatly improved.  Will discharge with albuterol inhaler and close OB/GYN follow-up. I reviewed labs including strep and COVID/flu screening which were all negative. Suspect acute bronchitis with bronchospasm. Visualized and interpreted two-view chest x-ray which shows no acute findings.  Patient feels improved and will be discharged with symptomatic treatment and albuterol inhaler.  Amount and/or Complexity of Data Reviewed Labs:  Decision-making details documented in ED Course. Radiology: ordered.  Risk Prescription drug management.           Final Clinical Impression(s) / ED Diagnoses Final diagnoses:  None    Rx / DC Orders ED Discharge Orders     None         Arthor Captain, PA-C 10/30/22 1504    Zadie Rhine, MD 10/30/22 1528

## 2022-10-30 NOTE — ED Notes (Signed)
Discharge paperwork given and verbally understood. 

## 2022-11-07 ENCOUNTER — Telehealth: Payer: Commercial Managed Care - HMO

## 2022-11-17 ENCOUNTER — Encounter: Payer: Self-pay | Admitting: Certified Nurse Midwife

## 2022-11-20 ENCOUNTER — Telehealth: Payer: Self-pay

## 2022-11-20 NOTE — Telephone Encounter (Signed)
VM left on nurse line 11/17/22 asking for assistance with FMLA. Per chart review patient will be seen for new ob visit on 11/22/22. MyChart message also sent by patient but this included housing information, not FMLA paperwork.

## 2022-11-21 DIAGNOSIS — Z3490 Encounter for supervision of normal pregnancy, unspecified, unspecified trimester: Secondary | ICD-10-CM | POA: Insufficient documentation

## 2022-11-22 ENCOUNTER — Ambulatory Visit (INDEPENDENT_AMBULATORY_CARE_PROVIDER_SITE_OTHER): Payer: Commercial Managed Care - HMO | Admitting: Certified Nurse Midwife

## 2022-11-22 ENCOUNTER — Encounter: Payer: Self-pay | Admitting: Certified Nurse Midwife

## 2022-11-22 ENCOUNTER — Other Ambulatory Visit (HOSPITAL_COMMUNITY)
Admission: RE | Admit: 2022-11-22 | Discharge: 2022-11-22 | Disposition: A | Discharge status: Home / Self Care | Payer: Commercial Managed Care - HMO | Source: Ambulatory Visit | Attending: Certified Nurse Midwife | Admitting: Certified Nurse Midwife

## 2022-11-22 VITALS — BP 124/69 | HR 76 | Ht 67.0 in | Wt 188.6 lb

## 2022-11-22 DIAGNOSIS — R002 Palpitations: Secondary | ICD-10-CM

## 2022-11-22 DIAGNOSIS — Z3490 Encounter for supervision of normal pregnancy, unspecified, unspecified trimester: Secondary | ICD-10-CM

## 2022-11-22 DIAGNOSIS — Z3491 Encounter for supervision of normal pregnancy, unspecified, first trimester: Secondary | ICD-10-CM | POA: Diagnosis not present

## 2022-11-22 DIAGNOSIS — Z3A12 12 weeks gestation of pregnancy: Secondary | ICD-10-CM | POA: Diagnosis not present

## 2022-11-22 MED ORDER — VITAFOL GUMMIES 3.33-0.333-34.8 MG PO CHEW
3.0000 | CHEWABLE_TABLET | Freq: Every day | ORAL | 11 refills | Status: DC
Start: 2022-11-22 — End: 2023-02-06

## 2022-11-22 MED ORDER — ASPIRIN 81 MG PO TBEC
81.0000 mg | DELAYED_RELEASE_TABLET | Freq: Every day | ORAL | 12 refills | Status: DC
Start: 2022-11-22 — End: 2023-06-08

## 2022-11-23 DIAGNOSIS — R002 Palpitations: Secondary | ICD-10-CM | POA: Insufficient documentation

## 2022-11-23 HISTORY — DX: Palpitations: R00.2

## 2022-11-23 LAB — CBC/D/PLT+RPR+RH+ABO+RUBIGG...
Antibody Screen: NEGATIVE
Basophils Absolute: 0 10*3/uL (ref 0.0–0.2)
Basos: 0 %
EOS (ABSOLUTE): 0 10*3/uL (ref 0.0–0.4)
Eos: 1 %
HCV Ab: NONREACTIVE
HIV Screen 4th Generation wRfx: NONREACTIVE
Hematocrit: 42.6 % (ref 34.0–46.6)
Hemoglobin: 14.5 g/dL (ref 11.1–15.9)
Hepatitis B Surface Ag: NEGATIVE
Immature Grans (Abs): 0 10*3/uL (ref 0.0–0.1)
Immature Granulocytes: 0 %
Lymphocytes Absolute: 1.8 10*3/uL (ref 0.7–3.1)
Lymphs: 26 %
MCH: 31.5 pg (ref 26.6–33.0)
MCHC: 34 g/dL (ref 31.5–35.7)
MCV: 93 fL (ref 79–97)
Monocytes Absolute: 0.6 10*3/uL (ref 0.1–0.9)
Monocytes: 8 %
Neutrophils Absolute: 4.5 10*3/uL (ref 1.4–7.0)
Neutrophils: 65 %
Platelets: 172 10*3/uL (ref 150–450)
RBC: 4.6 x10E6/uL (ref 3.77–5.28)
RDW: 12.3 % (ref 11.7–15.4)
RPR Ser Ql: NONREACTIVE
Rh Factor: POSITIVE
Rubella Antibodies, IGG: 15.3 index (ref >0.99)
WBC: 7 10*3/uL (ref 3.4–10.8)

## 2022-11-23 LAB — POCT URINALYSIS DIP (DEVICE)
Bilirubin Urine: NEGATIVE
Glucose, UA: NEGATIVE mg/dL
Hgb urine dipstick: NEGATIVE
Ketones, ur: NEGATIVE mg/dL
Leukocytes,Ua: NEGATIVE
Nitrite: NEGATIVE
Protein, ur: NEGATIVE mg/dL
Specific Gravity, Urine: 1.02 (ref 1.005–1.030)
Urobilinogen, UA: 1 mg/dL (ref 0.0–1.0)
pH: 7 (ref 5.0–8.0)

## 2022-11-23 LAB — HEMOGLOBIN A1C
Est. average glucose Bld gHb Est-mCnc: 108 mg/dL
Hgb A1c MFr Bld: 5.4 % (ref 4.8–5.6)

## 2022-11-23 LAB — HCV INTERPRETATION

## 2022-11-23 NOTE — Progress Notes (Signed)
History:   Natasha Beard is a 27 y.o. G1P0000 at [redacted]w[redacted]d by early ultrasound being seen today for her first obstetrical visit.  Her obstetrical history is significant for obesity. Patient does intend to breast feed. Pregnancy history fully reviewed.  Patient reports  occasional nausea, vomiting if she waits too long to eat but is overall doing well and excited about this very desired (and planned) pregnancy .      HISTORY: OB History  Gravida Para Term Preterm AB Living  1 0 0 0 0 0  SAB IAB Ectopic Multiple Live Births  0 0 0 0 0    # Outcome Date GA Lbr Len/2nd Weight Sex Delivery Anes PTL Lv  1 Current             Last pap smear was done >8yrs ago and was normal  History reviewed. No pertinent past medical history. History reviewed. No pertinent surgical history. Family History  Problem Relation Age of Onset   Healthy Mother    Diabetes Father    Seizures Father    Breast cancer Maternal Grandmother    Breast cancer Maternal Aunt    Social History   Tobacco Use   Smoking status: Never   Smokeless tobacco: Never  Vaping Use   Vaping Use: Never used  Substance Use Topics   Alcohol use: Not Currently   Drug use: Not Currently    Types: Marijuana   Allergies  Allergen Reactions   Apple Juice    Chocolate     In moderation   Erythromycin Swelling   Current Outpatient Medications on File Prior to Visit  Medication Sig Dispense Refill   albuterol (PROVENTIL HFA;VENTOLIN HFA) 108 (90 Base) MCG/ACT inhaler Inhale 1-2 puffs into the lungs every 6 (six) hours as needed for wheezing or shortness of breath. 1 Inhaler 0   Prenatal Vit-Fe Fumarate-FA (PRENATAL PO) Take 1 tablet by mouth daily.     Spacer/Aero-Holding Chambers (AEROCHAMBER PLUS) inhaler Use as instructed 1 each 2   ondansetron (ZOFRAN-ODT) 4 MG disintegrating tablet Take 1 tablet (4 mg total) by mouth every 8 (eight) hours as needed. (Patient not taking: Reported on 11/22/2022) 20 tablet 0   No current  facility-administered medications on file prior to visit.    Review of Systems Pertinent items noted in HPI and remainder of comprehensive ROS otherwise negative. Physical Exam:   Vitals:   11/22/22 1037  BP: 124/69  Pulse: 76  Weight: 188 lb 9.6 oz (85.5 kg)   Fetal Heart Rate (bpm): 166  Constitutional: Well-developed, well-nourished pregnant female in no acute distress.  HEENT: PERRLA Skin: normal color and turgor, no rash Cardiovascular: normal rate & rhythm, no murmur noted Respiratory: normal effort, lung sounds clear throughout GI: Abd soft, non-tender, non-distended MS: Extremities nontender, no edema, normal ROM Neurologic: Alert and oriented x 4.  GU: no CVA tenderness Pelvic: NEFG, physiologic discharge, no blood, cervix clean. Pap/swabs collected   Assessment:   Pregnancy: G1P0000 Patient Active Problem List   Diagnosis Date Noted   Palpitations 11/23/2022   Supervision of low-risk pregnancy 11/21/2022     Plan:   1. Encounter for supervision of low-risk pregnancy, antepartum - Doing well, doxylamine has calmed nausea - CBC/D/Plt+RPR+Rh+ABO+RubIgG... - HgB A1c - Culture, OB Urine - Cytology - PAP( [facility]) - [facility] Basic Panel - PANORAMA PRENATAL TEST - CHL AMB BABYSCRIPTS SCHEDULE OPTIMIZATION - Prenatal Vit-Fe Phos-FA-Omega (VITAFOL GUMMIES) 3.33-0.333-34.8 MG CHEW; Chew 3 tablets by mouth daily.  Dispense: 90 tablet;  Refill: 11  2. [redacted] weeks gestation of pregnancy - Routine OB care including anticipatory guidance re AFP at next visit. Also recommended daily aspirin to decrease risk of preeclampsia (has increased BMI and is AA) - aspirin EC 81 MG tablet; Take 1 tablet (81 mg total) by mouth daily. Swallow whole.  Dispense: 30 tablet; Refill: 12  3. Palpitations - Reported to ED on 5/16, referral was placed and will see Dr. Servando Salina on 7/9. NSR noted at last ED visit.  4. Initial obstetric visit in first trimester - Initial labs drawn. - Continue  prenatal vitamins. - Problem list reviewed and updated. - Genetic Screening discussed, First trimester screen, Quad screen, and NIPS: ordered. - Ultrasound discussed; fetal anatomic survey: ordered. - Anticipatory guidance about prenatal visits given including labs, ultrasounds, and testing. - Discussed usage of Babyscripts and virtual visits as additional source of managing and completing prenatal visits in midst of coronavirus and pandemic.   - Encouraged to complete MyChart Registration for her ability to review results, send requests, and have questions addressed.  - The nature of [facility] Healthcare/Faculty Practice with multiple MDs and Advanced Practice Providers was explained to patient; also emphasized that residents, students are part of our team. Desires midwifery care. - Routine obstetric precautions reviewed. Encouraged to seek out care at office or emergency room [facility] preferred) for urgent and/or emergent concerns.  Return in about 4 weeks (around 12/20/2022) for IN-PERSON, LOB.    Edd Arbour, MSN, CNM, IBCLC Certified Nurse Midwife, [facility]

## 2022-11-23 NOTE — Patient Instructions (Signed)
[facility]  Tuesdays & Thursdays 9:30am-3:30pm [address], [place], [place] Located in the General Classroom Building ([facility]B) Room 110A  For more information: Call: [phone] or Email: [email or web address] To book a FREE appt: [email or web address]  Offering prenatal consults,  postpartum outpatient lactation consults,  pumping consults and  "Each One, Teach One" informal lactation education for support persons.    

## 2022-11-24 LAB — URINE CULTURE, OB REFLEX

## 2022-11-24 LAB — CULTURE, OB URINE

## 2022-11-27 ENCOUNTER — Encounter: Payer: Self-pay | Admitting: *Deleted

## 2022-11-28 LAB — CYTOLOGY - PAP
Chlamydia: NEGATIVE
Comment: NEGATIVE
Comment: NEGATIVE
Comment: NORMAL
Diagnosis: NEGATIVE
Neisseria Gonorrhea: NEGATIVE
Trichomonas: NEGATIVE

## 2022-11-29 LAB — PANORAMA PRENATAL TEST FULL PANEL:PANORAMA TEST PLUS 5 ADDITIONAL MICRODELETIONS: FETAL FRACTION: 8.1

## 2022-12-02 LAB — HORIZON CUSTOM: REPORT SUMMARY: NEGATIVE

## 2022-12-05 ENCOUNTER — Emergency Department (HOSPITAL_BASED_OUTPATIENT_CLINIC_OR_DEPARTMENT_OTHER)
Admission: EM | Admit: 2022-12-05 | Discharge: 2022-12-05 | Disposition: A | Discharge status: Home / Self Care | Payer: Commercial Managed Care - HMO | Attending: Emergency Medicine | Admitting: Emergency Medicine

## 2022-12-05 ENCOUNTER — Other Ambulatory Visit: Payer: Self-pay

## 2022-12-05 ENCOUNTER — Encounter (HOSPITAL_BASED_OUTPATIENT_CLINIC_OR_DEPARTMENT_OTHER): Payer: Self-pay | Admitting: Emergency Medicine

## 2022-12-05 ENCOUNTER — Other Ambulatory Visit (HOSPITAL_BASED_OUTPATIENT_CLINIC_OR_DEPARTMENT_OTHER): Payer: Self-pay

## 2022-12-05 ENCOUNTER — Encounter: Payer: Self-pay | Admitting: Certified Nurse Midwife

## 2022-12-05 DIAGNOSIS — N390 Urinary tract infection, site not specified: Secondary | ICD-10-CM | POA: Insufficient documentation

## 2022-12-05 DIAGNOSIS — O218 Other vomiting complicating pregnancy: Secondary | ICD-10-CM | POA: Diagnosis not present

## 2022-12-05 DIAGNOSIS — K920 Hematemesis: Secondary | ICD-10-CM

## 2022-12-05 DIAGNOSIS — Z1152 Encounter for screening for COVID-19: Secondary | ICD-10-CM | POA: Diagnosis not present

## 2022-12-05 DIAGNOSIS — O2342 Unspecified infection of urinary tract in pregnancy, second trimester: Secondary | ICD-10-CM

## 2022-12-05 DIAGNOSIS — Z7982 Long term (current) use of aspirin: Secondary | ICD-10-CM | POA: Insufficient documentation

## 2022-12-05 DIAGNOSIS — O219 Vomiting of pregnancy, unspecified: Secondary | ICD-10-CM | POA: Diagnosis present

## 2022-12-05 DIAGNOSIS — Z3A12 12 weeks gestation of pregnancy: Secondary | ICD-10-CM | POA: Insufficient documentation

## 2022-12-05 DIAGNOSIS — O2341 Unspecified infection of urinary tract in pregnancy, first trimester: Secondary | ICD-10-CM | POA: Diagnosis not present

## 2022-12-05 DIAGNOSIS — R112 Nausea with vomiting, unspecified: Secondary | ICD-10-CM

## 2022-12-05 LAB — CBC WITH DIFFERENTIAL/PLATELET
Abs Immature Granulocytes: 0.03 10*3/uL (ref 0.00–0.07)
Basophils Absolute: 0 10*3/uL (ref 0.0–0.1)
Basophils Relative: 0 %
Eosinophils Absolute: 0 10*3/uL (ref 0.0–0.5)
Eosinophils Relative: 0 %
HCT: 39.1 % (ref 36.0–46.0)
Hemoglobin: 13.8 g/dL (ref 12.0–15.0)
Immature Granulocytes: 0 %
Lymphocytes Relative: 16 %
Lymphs Abs: 1.4 10*3/uL (ref 0.7–4.0)
MCH: 31.6 pg (ref 26.0–34.0)
MCHC: 35.3 g/dL (ref 30.0–36.0)
MCV: 89.5 fL (ref 80.0–100.0)
Monocytes Absolute: 0.4 10*3/uL (ref 0.1–1.0)
Monocytes Relative: 5 %
Neutro Abs: 6.6 10*3/uL (ref 1.7–7.7)
Neutrophils Relative %: 79 %
Platelets: 169 10*3/uL (ref 150–400)
RBC: 4.37 MIL/uL (ref 3.87–5.11)
RDW: 13.3 % (ref 11.5–15.5)
WBC: 8.5 10*3/uL (ref 4.0–10.5)
nRBC: 0 % (ref 0.0–0.2)

## 2022-12-05 LAB — URINALYSIS, W/ REFLEX TO CULTURE (INFECTION SUSPECTED)
Bilirubin Urine: NEGATIVE
Glucose, UA: NEGATIVE mg/dL
Hgb urine dipstick: NEGATIVE
Ketones, ur: NEGATIVE mg/dL
Nitrite: NEGATIVE
Protein, ur: 30 mg/dL — AB
Specific Gravity, Urine: 1.027 (ref 1.005–1.030)
pH: 6.5 (ref 5.0–8.0)

## 2022-12-05 LAB — BASIC METABOLIC PANEL
Anion gap: 8 (ref 5–15)
BUN: <5 mg/dL — ABNORMAL LOW (ref 6–20)
CO2: 22 mmol/L (ref 22–32)
Calcium: 9 mg/dL (ref 8.9–10.3)
Chloride: 106 mmol/L (ref 98–111)
Creatinine, Ser: 0.42 mg/dL — ABNORMAL LOW (ref 0.44–1.00)
GFR, Estimated: >60 mL/min (ref >60)
Glucose, Bld: 106 mg/dL — ABNORMAL HIGH (ref 70–99)
Potassium: 3.6 mmol/L (ref 3.5–5.1)
Sodium: 136 mmol/L (ref 135–145)

## 2022-12-05 LAB — RAPID URINE DRUG SCREEN, HOSP PERFORMED
Amphetamines: NOT DETECTED
Barbiturates: NOT DETECTED
Benzodiazepines: NOT DETECTED
Cocaine: NOT DETECTED
Opiates: NOT DETECTED
Tetrahydrocannabinol: POSITIVE — AB

## 2022-12-05 LAB — LIPASE, BLOOD: Lipase: <10 U/L — ABNORMAL LOW (ref 11–51)

## 2022-12-05 LAB — SARS CORONAVIRUS 2 BY RT PCR: SARS Coronavirus 2 by RT PCR: NEGATIVE

## 2022-12-05 MED ORDER — LACTATED RINGERS IV BOLUS
1000.0000 mL | Freq: Once | INTRAVENOUS | Status: AC
  Administered 2022-12-05: 1000 mL via INTRAVENOUS

## 2022-12-05 MED ORDER — ONDANSETRON HCL 4 MG/2ML IJ SOLN
4.0000 mg | Freq: Once | INTRAMUSCULAR | Status: AC
  Administered 2022-12-05: 4 mg via INTRAVENOUS
  Filled 2022-12-05: qty 2

## 2022-12-05 MED ORDER — CEPHALEXIN 500 MG PO CAPS
500.0000 mg | ORAL_CAPSULE | Freq: Three times a day (TID) | ORAL | 0 refills | Status: AC
  Filled 2022-12-05: qty 15, 5d supply, fill #0

## 2022-12-05 NOTE — ED Triage Notes (Signed)
[redacted] weeks pregnant. Vomiting daily. Concerned because she saw blood in vomit.

## 2022-12-05 NOTE — Discharge Instructions (Addendum)
Your symptoms of bloody vomiting are likely due to a Mallory-Weiss tear or gastritis in the setting of your nausea and vomiting.  Your urinalysis was positive for bacteria in your urine for which we will treat with a course of Keflex.  Continue to push fluid resuscitation, take your antibiotics and take your home Zofran.  Follow-up with your PCP.  Return for any severe worsening of symptoms to include worsening bloody vomit development of any severe sharp ripping chest pain as this could indicate a full-thickness esophageal tear.  Normally these small esophageal tears in the setting of vomiting heal up on their own without need for any further intervention.

## 2022-12-05 NOTE — ED Notes (Addendum)
Pt given fluids beore leaving and able to keep it down without feeling N/V.Marland KitchenMarland Kitchen

## 2022-12-05 NOTE — ED Provider Notes (Signed)
[place] EMERGENCY DEPARTMENT AT [facility] Provider Note   CSN: [id number] Arrival date & time: 12/05/22  1013     History  Chief Complaint  Patient presents with   Emesis During Pregnancy    Natasha Beard is a 27 y.o. female.  HPI   27 year old G1P0 female [redacted] weeks pregnant presenting to the emergency department with nausea and vomiting and hematemesis.  The patient states that she has had 3 days of nausea and vomiting.  She has Zofran at home that she has been taking.  She has been able to hold down solids but has not been able to do well with fluids.  She denies any THC use but states that she is around someone who smokes marijuana.  She is had ultrasound imaging that has shown an IUP.  She denies any vaginal bleeding, vaginal discharge, leakage of fluid.  She denies any dysuria, does endorse slight increased urinary frequency.  No fevers or chills.  Home Medications Prior to Admission medications   Medication Sig Start Date End Date Taking? Authorizing Provider  cephALEXin (KEFLEX) 500 MG capsule Take 1 capsule (500 mg total) by mouth 3 (three) times daily for 5 days. 12/05/22 12/10/22 Yes Ernie Avena, MD  albuterol (PROVENTIL HFA;VENTOLIN HFA) 108 (90 Base) MCG/ACT inhaler Inhale 1-2 puffs into the lungs every 6 (six) hours as needed for wheezing or shortness of breath. 07/04/18   Domenick Gong, MD  aspirin EC 81 MG tablet Take 1 tablet (81 mg total) by mouth daily. Swallow whole. 11/22/22   Edd Arbour R, CNM  ondansetron (ZOFRAN-ODT) 4 MG disintegrating tablet Take 1 tablet (4 mg total) by mouth every 8 (eight) hours as needed. Patient not taking: Reported on 11/22/2022 10/03/22   Ernie Avena, MD  Prenatal Vit-Fe Fumarate-FA (PRENATAL PO) Take 1 tablet by mouth daily.    [provider]  Prenatal Vit-Fe Phos-FA-Omega (VITAFOL GUMMIES) 3.33-0.333-34.8 MG CHEW Chew 3 tablets by mouth daily. 11/22/22   Bernerd Limbo, CNM  Spacer/Aero-Holding Chambers  (AEROCHAMBER PLUS) inhaler Use as instructed 07/04/18   Domenick Gong, MD      Allergies    Apple juice, Chocolate, and Erythromycin    Review of Systems   Review of Systems  All other systems reviewed and are negative.   Physical Exam Updated Vital Signs BP 123/73 (BP Location: Right Arm)   Pulse 71   Temp 99.2 F (37.3 C) (Oral)   Resp 18   Ht 5\' 7"  (1.702 m)   Wt 85.7 kg   LMP 08/31/2022 (Exact Date) Comment: [redacted] weeks pregnant  SpO2 100%   BMI 29.60 kg/m  Physical Exam Vitals and nursing note reviewed.  Constitutional:      General: She is not in acute distress.    Appearance: She is well-developed.  HENT:     Head: Normocephalic and atraumatic.  Eyes:     Conjunctiva/sclera: Conjunctivae normal.  Cardiovascular:     Rate and Rhythm: Normal rate and regular rhythm.  Pulmonary:     Effort: Pulmonary effort is normal. No respiratory distress.     Breath sounds: Normal breath sounds.  Abdominal:     Palpations: Abdomen is soft.     Tenderness: There is no abdominal tenderness. There is no guarding or rebound.  Musculoskeletal:        General: No swelling.     Cervical back: Neck supple.  Skin:    General: Skin is warm and dry.     Capillary Refill: Capillary refill  takes less than 2 seconds.  Neurological:     Mental Status: She is alert.  Psychiatric:        Mood and Affect: Mood normal.     ED Results / Procedures / Treatments   Labs (all labs ordered are listed, but only abnormal results are displayed) Labs Reviewed  BASIC METABOLIC PANEL - Abnormal; Notable for the following components:      Result Value   Glucose, Bld 106 (*)    BUN <5 (*)    Creatinine, Ser 0.42 (*)    All other components within normal limits  URINALYSIS, W/ REFLEX TO CULTURE (INFECTION SUSPECTED) - Abnormal; Notable for the following components:   APPearance HAZY (*)    Protein, ur 30 (*)    Leukocytes,Ua MODERATE (*)    Bacteria, UA RARE (*)    All other components  within normal limits  RAPID URINE DRUG SCREEN, HOSP PERFORMED - Abnormal; Notable for the following components:   Tetrahydrocannabinol POSITIVE (*)    All other components within normal limits  LIPASE, BLOOD - Abnormal; Notable for the following components:   Lipase <10 (*)    All other components within normal limits  SARS CORONAVIRUS 2 BY RT PCR  CBC WITH DIFFERENTIAL/PLATELET    EKG None  Radiology No results found.  Procedures Procedures    Medications Ordered in ED Medications  lactated ringers bolus 1,000 mL (0 mLs Intravenous Stopped 12/05/22 1233)  ondansetron (ZOFRAN) injection 4 mg (4 mg Intravenous Given 12/05/22 1137)    ED Course/ Medical Decision Making/ A&P                             Medical Decision Making Amount and/or Complexity of Data Reviewed Labs: ordered.  Risk Prescription drug management.     27 year old G1P0 female [redacted] weeks pregnant presenting to the emergency department with nausea and vomiting and hematemesis.  The patient states that she has had 3 days of nausea and vomiting.  She has Zofran at home that she has been taking.  She has been able to hold down solids but has not been able to do well with fluids.  She denies any THC use but states that she is around someone who smokes marijuana.  She is had ultrasound imaging that has shown an IUP.  She denies any vaginal bleeding, vaginal discharge, leakage of fluid.  She denies any dysuria, does endorse slight increased urinary frequency.  No fevers or chills.  On arrival, the patient was vitally stable.  Physical exam generally unremarkable.  The patient denies any vaginal bleeding, vaginal discharge or pelvic pain.  She denies any abdominal pain.  She presents with nausea and vomiting.  She states that she has exposure to [facility] but denies any THC use.  Has some increased urinary frequency.  Concern for UTI and nausea and vomiting of pregnancy.  Patient was administered IV fluid bolus in addition to  IV Zofran.  Her urinalysis was positive for bacteria pregnancy with moderate leukocytes, 11-20 WBCs and rare bacteria.  COVID-19 was negative, UDS was positive for THC, BMP was generally unremarkable, lipase was normal and CBC was unremarkable.  Regarding the patient's mild episode of hematemesis, likely Mallory-Weiss tear or gastritis in the setting of nausea and vomiting.  Advised that this showed resolve on its own.  She has no chest discomfort right now, do not think further evaluation with chest x-ray imaging is indicated.  Provided return  precautions in the event of any worsening hematemesis or severe chest pain.  Following the above interventions, the patient was feeling symptomatically improved, tolerating oral intake.  Will treat for bacteriuria/UTI pregnancy with a course of Keflex for 5 days.  Advised continued outpatient use of Zofran and continued rehydration, advised avoidance of any individuals who could be smoking marijuana as THC ingestion can contribute to nausea and vomiting presentation.  Overall stable at this time for discharge, stable for outpatient follow-up with her OB/GYN, return precautions provided.   Final Clinical Impression(s) / ED Diagnoses Final diagnoses:  Urinary tract infection in mother during second trimester of pregnancy  Nausea and vomiting, unspecified vomiting type  Hematemesis with nausea    Rx / DC Orders ED Discharge Orders          Ordered    cephALEXin (KEFLEX) 500 MG capsule  3 times daily        12/05/22 1231              Ernie Avena, MD 12/05/22 1236

## 2022-12-11 ENCOUNTER — Encounter (HOSPITAL_BASED_OUTPATIENT_CLINIC_OR_DEPARTMENT_OTHER): Payer: Self-pay | Admitting: Emergency Medicine

## 2022-12-11 ENCOUNTER — Emergency Department (HOSPITAL_BASED_OUTPATIENT_CLINIC_OR_DEPARTMENT_OTHER)
Admission: EM | Admit: 2022-12-11 | Discharge: 2022-12-11 | Disposition: A | Discharge status: Home / Self Care | Payer: Commercial Managed Care - HMO | Attending: Emergency Medicine | Admitting: Emergency Medicine

## 2022-12-11 ENCOUNTER — Other Ambulatory Visit (HOSPITAL_BASED_OUTPATIENT_CLINIC_OR_DEPARTMENT_OTHER): Payer: Self-pay

## 2022-12-11 ENCOUNTER — Other Ambulatory Visit: Payer: Self-pay

## 2022-12-11 DIAGNOSIS — Z3A14 14 weeks gestation of pregnancy: Secondary | ICD-10-CM | POA: Diagnosis not present

## 2022-12-11 DIAGNOSIS — O219 Vomiting of pregnancy, unspecified: Secondary | ICD-10-CM | POA: Insufficient documentation

## 2022-12-11 DIAGNOSIS — Z7982 Long term (current) use of aspirin: Secondary | ICD-10-CM | POA: Insufficient documentation

## 2022-12-11 DIAGNOSIS — K920 Hematemesis: Secondary | ICD-10-CM

## 2022-12-11 LAB — URINALYSIS, ROUTINE W REFLEX MICROSCOPIC
Bilirubin Urine: NEGATIVE
Glucose, UA: NEGATIVE mg/dL
Hgb urine dipstick: NEGATIVE
Ketones, ur: 15 mg/dL — AB
Nitrite: NEGATIVE
Specific Gravity, Urine: 1.017 (ref 1.005–1.030)
pH: 7.5 (ref 5.0–8.0)

## 2022-12-11 LAB — CBC WITH DIFFERENTIAL/PLATELET
Abs Immature Granulocytes: 0.03 10*3/uL (ref 0.00–0.07)
Basophils Absolute: 0 10*3/uL (ref 0.0–0.1)
Basophils Relative: 0 %
Eosinophils Absolute: 0 10*3/uL (ref 0.0–0.5)
Eosinophils Relative: 0 %
HCT: 39.2 % (ref 36.0–46.0)
Hemoglobin: 14 g/dL (ref 12.0–15.0)
Immature Granulocytes: 0 %
Lymphocytes Relative: 19 %
Lymphs Abs: 1.4 10*3/uL (ref 0.7–4.0)
MCH: 32 pg (ref 26.0–34.0)
MCHC: 35.7 g/dL (ref 30.0–36.0)
MCV: 89.5 fL (ref 80.0–100.0)
Monocytes Absolute: 0.5 10*3/uL (ref 0.1–1.0)
Monocytes Relative: 7 %
Neutro Abs: 5.1 10*3/uL (ref 1.7–7.7)
Neutrophils Relative %: 74 %
Platelets: 170 10*3/uL (ref 150–400)
RBC: 4.38 MIL/uL (ref 3.87–5.11)
RDW: 13.2 % (ref 11.5–15.5)
WBC: 7.1 10*3/uL (ref 4.0–10.5)
nRBC: 0 % (ref 0.0–0.2)

## 2022-12-11 LAB — COMPREHENSIVE METABOLIC PANEL
ALT: 16 U/L (ref 0–44)
AST: 15 U/L (ref 15–41)
Albumin: 3.8 g/dL (ref 3.5–5.0)
Alkaline Phosphatase: 40 U/L (ref 38–126)
Anion gap: 8 (ref 5–15)
BUN: <5 mg/dL — ABNORMAL LOW (ref 6–20)
CO2: 23 mmol/L (ref 22–32)
Calcium: 9 mg/dL (ref 8.9–10.3)
Chloride: 105 mmol/L (ref 98–111)
Creatinine, Ser: 0.44 mg/dL (ref 0.44–1.00)
GFR, Estimated: >60 mL/min (ref >60)
Glucose, Bld: 100 mg/dL — ABNORMAL HIGH (ref 70–99)
Potassium: 3.4 mmol/L — ABNORMAL LOW (ref 3.5–5.1)
Sodium: 136 mmol/L (ref 135–145)
Total Bilirubin: 0.5 mg/dL (ref 0.3–1.2)
Total Protein: 6.5 g/dL (ref 6.5–8.1)

## 2022-12-11 LAB — RAPID URINE DRUG SCREEN, HOSP PERFORMED
Amphetamines: NOT DETECTED
Barbiturates: NOT DETECTED
Benzodiazepines: NOT DETECTED
Cocaine: NOT DETECTED
Opiates: NOT DETECTED
Tetrahydrocannabinol: POSITIVE — AB

## 2022-12-11 MED ORDER — ONDANSETRON HCL 4 MG/2ML IJ SOLN
4.0000 mg | Freq: Once | INTRAMUSCULAR | Status: AC
  Administered 2022-12-11: 4 mg via INTRAVENOUS
  Filled 2022-12-11: qty 2

## 2022-12-11 MED ORDER — ONDANSETRON 4 MG PO TBDP: 4.0000 mg | ORAL_TABLET | Freq: Three times a day (TID) | ORAL | 0 refills | Status: DC | PRN

## 2022-12-11 MED ORDER — LACTATED RINGERS IV BOLUS
1000.0000 mL | Freq: Once | INTRAVENOUS | Status: AC
  Administered 2022-12-11: 1000 mL via INTRAVENOUS

## 2022-12-11 MED ORDER — FAMOTIDINE 20 MG PO TABS: 20.0000 mg | ORAL_TABLET | Freq: Every day | ORAL | 0 refills | Status: DC

## 2022-12-11 NOTE — ED Triage Notes (Signed)
Pt arrives to ED with c/o hematemesis x1 week. Was seen on 6/18 for same. Pt notes she is [redacted] weeks pregnant.

## 2022-12-11 NOTE — ED Notes (Signed)
Patient verbalizes understanding of discharge instructions. Opportunity for questioning and answers were provided. Patient discharged from ED.  °

## 2022-12-11 NOTE — ED Provider Notes (Signed)
[place] EMERGENCY DEPARTMENT AT [facility] Provider Note   CSN: [id number] Arrival date & time: 12/11/22  0840     History Chief Complaint  Patient presents with   Hematemesis    Natasha Beard is a 27 y.o. female.  Patient presents to the emergency department with complaints of hematemesis.  She reports that she was seen approximately 6 days ago for similar concerns.  She is currently 14+ weeks pregnant.  States that prior to the onset of hematemesis about 6 days ago, she had been experiencing significant nausea and vomiting at that time.  Denies any substance use but does report that son in the home smokes marijuana.  Last seen by OB in early June with next appointment scheduled for early July.  No prior history of any GI bleeds.  Not currently on any blood thinners.  Denies any significant abdominal pain or discomfort, chest pain, shortness of breath, but does endorse some burning sensation in the center of her chest up to her neck.  HPI     Home Medications Prior to Admission medications   Medication Sig Start Date End Date Taking? Authorizing Provider  famotidine (PEPCID) 20 MG tablet Take 1 tablet (20 mg total) by mouth daily. 12/11/22 01/10/23 Yes Smitty Knudsen, PA-C  ondansetron (ZOFRAN-ODT) 4 MG disintegrating tablet Take 1 tablet (4 mg total) by mouth every 8 (eight) hours as needed for nausea or vomiting. 12/11/22  Yes Wavie Hashimi A, PA-C  albuterol (PROVENTIL HFA;VENTOLIN HFA) 108 (90 Base) MCG/ACT inhaler Inhale 1-2 puffs into the lungs every 6 (six) hours as needed for wheezing or shortness of breath. 07/04/18   Domenick Gong, MD  aspirin EC 81 MG tablet Take 1 tablet (81 mg total) by mouth daily. Swallow whole. 11/22/22   Edd Arbour R, CNM  ondansetron (ZOFRAN-ODT) 4 MG disintegrating tablet Take 1 tablet (4 mg total) by mouth every 8 (eight) hours as needed. Patient not taking: Reported on 11/22/2022 10/03/22   Ernie Avena, MD  Prenatal Vit-Fe Fumarate-FA  (PRENATAL PO) Take 1 tablet by mouth daily.    [provider]  Prenatal Vit-Fe Phos-FA-Omega (VITAFOL GUMMIES) 3.33-0.333-34.8 MG CHEW Chew 3 tablets by mouth daily. 11/22/22   Bernerd Limbo, CNM  Spacer/Aero-Holding Chambers (AEROCHAMBER PLUS) inhaler Use as instructed 07/04/18   Domenick Gong, MD      Allergies    Apple juice, Chocolate, and Erythromycin    Review of Systems   Review of Systems  Gastrointestinal:  Positive for vomiting.  All other systems reviewed and are negative.   Physical Exam Updated Vital Signs BP 115/78   Pulse 74   Temp 98.7 F (37.1 C) (Oral)   Resp 18   LMP 08/31/2022 (Exact Date) Comment: [redacted] weeks pregnant  SpO2 100%  Physical Exam Vitals and nursing note reviewed.  Constitutional:      General: She is not in acute distress.    Appearance: She is well-developed.  HENT:     Head: Normocephalic and atraumatic.  Eyes:     Conjunctiva/sclera: Conjunctivae normal.  Cardiovascular:     Rate and Rhythm: Normal rate and regular rhythm.     Heart sounds: No murmur heard. Pulmonary:     Effort: Pulmonary effort is normal. No respiratory distress.     Breath sounds: Normal breath sounds.  Abdominal:     General: Abdomen is flat. Bowel sounds are normal. There is no distension.     Palpations: Abdomen is soft. There is no mass.  Tenderness: There is no abdominal tenderness.  Musculoskeletal:        General: No swelling.     Cervical back: Neck supple.  Skin:    General: Skin is warm and dry.     Capillary Refill: Capillary refill takes less than 2 seconds.  Neurological:     Mental Status: She is alert.  Psychiatric:        Mood and Affect: Mood normal.     ED Results / Procedures / Treatments   Labs (all labs ordered are listed, but only abnormal results are displayed) Labs Reviewed  URINALYSIS, ROUTINE W REFLEX MICROSCOPIC - Abnormal; Notable for the following components:      Result Value   APPearance CLOUDY (*)     Ketones, ur 15 (*)    Protein, ur TRACE (*)    Leukocytes,Ua LARGE (*)    Bacteria, UA FEW (*)    All other components within normal limits  COMPREHENSIVE METABOLIC PANEL - Abnormal; Notable for the following components:   Potassium 3.4 (*)    Glucose, Bld 100 (*)    BUN <5 (*)    All other components within normal limits  RAPID URINE DRUG SCREEN, HOSP PERFORMED - Abnormal; Notable for the following components:   Tetrahydrocannabinol POSITIVE (*)    All other components within normal limits  CBC WITH DIFFERENTIAL/PLATELET    EKG None  Radiology No results found.  Procedures Procedures   Medications Ordered in ED Medications  lactated ringers bolus 1,000 mL (0 mLs Intravenous Stopped 12/11/22 1046)  ondansetron (ZOFRAN) injection 4 mg (4 mg Intravenous Given 12/11/22 1914)    ED Course/ Medical Decision Making/ A&P                           Medical Decision Making Amount and/or Complexity of Data Reviewed Labs: ordered.  Risk Prescription drug management.   This patient presents to the ED for concern of nausea and vomiting.  Differential diagnosis includes nausea and vomiting pregnancy, hematemesis, gastroenteritis, bowel obstruction, UTI   Lab Tests:  I Ordered, and personally interpreted labs.  The pertinent results include: CBC unremarkable with hemoglobin, hematocrit, red blood cells at normal levels, CMP reassuring, UA with evidence of leg infection still present, UDS positive for tetrahydrocannabinol   Medicines ordered and prescription drug management:  I ordered medication including Zofran, fluids for nausea and vomiting Reevaluation of the patient after these medicines showed that the patient improved I have reviewed the patients home medicines and have made adjustments as needed   Problem List / ED Course:  Patient presents emergency department complaints of hematemesis.  She is currently [redacted] weeks pregnant and reports that she was having significant  nausea and vomiting prior to onset of hematemesis about 6 days ago.  Was seen on 12/05/2022 in the ER for similar concerns.  States that she has not had significant improvement in her symptoms and is not planning on being seen by OB until early July.  Given continuation of symptoms and patient reporting that symptoms been worsening, will reevaluate basic labs to check for signs of any notable drop in hemoglobin/hematocrit.  Will initiate treatment with fluid resuscitation and Zofran for nausea.  Will reassess patient shortly. Glascow-Blatchford bleeding score currently a 0.  This indicates that patient is experiencing a low risk of bleed.  However will provide patient with [facility] information so patient can reach out for evaluation if symptoms or not improving.  Renewed  prescription for Zofran and also sent a prescription for Pepcid.  Patient's hemoglobin has a small increase compared to 2 days ago reducing the risk that this is a significant GI bleed that patient is experiencing.  Patient most likely is experiencing something similar to that of a Mallory-Weiss tear or other esophageal irritation from the vomiting that she has been experiencing in her pregnancy.  While here in the emergency department, patient has not had any recurrence of vomiting and has not been able to tolerate oral intake after dose of IV Zofran.  Believe that patient is currently safe and stable for discharge home with outpatient follow-up with OB/GYN as well as GI as needed.  Discussed return precautions such as progressively worsening bleed, pallor, syncope.  Also advised patient to not use any blood thinning medications such as aspirin, ibuprofen, Aleve.  Patient is agreeable with treatment plan verbalized understanding all return precautions.  All questions answered prior to patient discharge.  Final Clinical Impression(s) / ED Diagnoses Final diagnoses:  Nausea and vomiting in pregnancy prior to [redacted] weeks gestation  Hematemesis  with nausea    Rx / DC Orders ED Discharge Orders          Ordered    famotidine (PEPCID) 20 MG tablet  Daily        12/11/22 1235    ondansetron (ZOFRAN-ODT) 4 MG disintegrating tablet  Every 8 hours PRN        12/11/22 1235              Smitty Knudsen, PA-C 12/11/22 1240    Rolan Bucco, MD 12/11/22 1437

## 2022-12-11 NOTE — Discharge Instructions (Signed)
You were seen in the emergency department for nausea, vomiting, blood in your vomit.  Your labs were reassuring with your hemoglobin, hematocrit increasing compared to a few days ago.  This indicates that there is a low concern for a worsening bleed.  I would advise a follow-up with your primary care provider/OB/GYN for further evaluation.  If you have any acute worsening of your symptoms, please return to the emergency department.

## 2022-12-11 NOTE — ED Notes (Signed)
Pt given ice water per PA approval

## 2022-12-20 ENCOUNTER — Encounter: Payer: Self-pay | Admitting: Family Medicine

## 2022-12-20 ENCOUNTER — Ambulatory Visit (INDEPENDENT_AMBULATORY_CARE_PROVIDER_SITE_OTHER): Payer: Commercial Managed Care - HMO | Admitting: Certified Nurse Midwife

## 2022-12-20 ENCOUNTER — Other Ambulatory Visit: Payer: Self-pay

## 2022-12-20 VITALS — BP 120/64 | HR 79 | Wt 187.0 lb

## 2022-12-20 DIAGNOSIS — Z3A15 15 weeks gestation of pregnancy: Secondary | ICD-10-CM

## 2022-12-20 DIAGNOSIS — O219 Vomiting of pregnancy, unspecified: Secondary | ICD-10-CM

## 2022-12-20 DIAGNOSIS — O99612 Diseases of the digestive system complicating pregnancy, second trimester: Secondary | ICD-10-CM

## 2022-12-20 DIAGNOSIS — Z3492 Encounter for supervision of normal pregnancy, unspecified, second trimester: Secondary | ICD-10-CM

## 2022-12-20 DIAGNOSIS — K219 Gastro-esophageal reflux disease without esophagitis: Secondary | ICD-10-CM

## 2022-12-20 DIAGNOSIS — R002 Palpitations: Secondary | ICD-10-CM

## 2022-12-20 LAB — POCT URINALYSIS DIP (DEVICE)
Bilirubin Urine: NEGATIVE
Glucose, UA: NEGATIVE mg/dL
Hgb urine dipstick: NEGATIVE
Ketones, ur: NEGATIVE mg/dL
Nitrite: NEGATIVE
Protein, ur: NEGATIVE mg/dL
Specific Gravity, Urine: 1.025 (ref 1.005–1.030)
Urobilinogen, UA: 1 mg/dL (ref 0.0–1.0)
pH: 6.5 (ref 5.0–8.0)

## 2022-12-20 NOTE — Progress Notes (Signed)
   PRENATAL VISIT NOTE  Subjective:  Natasha Beard is a 27 y.o. G1P0000 at [redacted]w[redacted]d being seen today for ongoing prenatal care.  She is currently monitored for the following issues for this low-risk pregnancy and has Supervision of low-risk pregnancy and Palpitations on their problem list.  Patient reports no complaints.  Contractions: Not present. Vag. Bleeding: None.  Movement: Absent. Denies leaking of fluid.   The following portions of the patient's history were reviewed and updated as appropriate: allergies, current medications, past family history, past medical history, past social history, past surgical history and problem list.   Objective:   Vitals:   12/20/22 1142  BP: 120/64  Pulse: 79  Weight: 187 lb (84.8 kg)    Fetal Status: Fetal Heart Rate (bpm): 168   Movement: Absent     General:  Alert, oriented and cooperative. Patient is in no acute distress.  Skin: Skin is warm and dry. No rash noted.   Cardiovascular: Normal heart rate noted  Respiratory: Normal respiratory effort, no problems with respiration noted  Abdomen: Soft, gravid, appropriate for gestational age.  Pain/Pressure: Present     Pelvic: Cervical exam deferred        Extremities: Normal range of motion.  Edema: None  Mental Status: Normal mood and affect. Normal behavior. Normal judgment and thought content.   Assessment and Plan:  Pregnancy: G1P0000 at [redacted]w[redacted]d 1. Encounter for supervision of low-risk pregnancy in second trimester - Doing well today, lots of energy, starting to feel flutters - Strongly encouraged her to come to [facility] for future emergent needs - [place] MFM OB DETAIL +14 WK; Future  2. [redacted] weeks gestation of pregnancy - Routine OB care  - AFP, Serum, Open Spina Bifida  3. Nausea and vomiting during pregnancy - Tolerable now with zofran  4. Gastroesophageal reflux in pregnancy - Gone with daily pepcid  Preterm labor symptoms and general obstetric precautions including but not limited to vaginal  bleeding, contractions, leaking of fluid and fetal movement were reviewed in detail with the patient. Please refer to After Visit Summary for other counseling recommendations.   Return in about 4 weeks (around 01/17/2023) for IN-PERSON, LOB.  Future Appointments  Date Time Provider Department Center  12/26/2022  8:40 AM Thomasene Ripple, DO [facility] None  01/17/2023  8:15 AM [facility] NURSE [facility]  01/17/2023  8:30 AM [facility] US3 [facility]  01/17/2023  9:35 AM Bernerd Limbo, CNM [facility]  02/14/2023 11:15 AM Bernerd Limbo, CNM [facility]    Bernerd Limbo, CNM

## 2022-12-20 NOTE — Patient Instructions (Signed)

## 2022-12-26 ENCOUNTER — Ambulatory Visit: Payer: Commercial Managed Care - HMO | Attending: Cardiology | Admitting: Cardiology

## 2022-12-26 ENCOUNTER — Encounter: Payer: Self-pay | Admitting: Cardiology

## 2022-12-26 ENCOUNTER — Ambulatory Visit: Payer: Commercial Managed Care - HMO | Attending: Cardiology

## 2022-12-26 VITALS — BP 114/68 | HR 84 | Ht 67.0 in | Wt 187.0 lb

## 2022-12-26 DIAGNOSIS — R002 Palpitations: Secondary | ICD-10-CM

## 2022-12-26 LAB — AFP, SERUM, OPEN SPINA BIFIDA
AFP MoM: 0.85
AFP Value: 27.1 ng/mL
Gest. Age on Collection Date: 15.9 weeks
Maternal Age At EDD: 28.2 yr
OSBR Risk 1 IN: 10000
Test Results:: NEGATIVE
Weight: 187 [lb_av]

## 2022-12-26 NOTE — Progress Notes (Unsigned)
Enrolled for [employer] to mail a ZIO XT long term holter monitor to the patients address on file.  

## 2022-12-26 NOTE — Patient Instructions (Addendum)
Medication Instructions:  Your physician recommends that you continue on your current medications as directed. Please refer to the Current Medication list given to you today.  *If you need a refill on your cardiac medications before your next appointment, please call your pharmacy*   Lab Work: None   Testing/Procedures: Christena Deem- Long Term Monitor Instructions  Your physician has requested you wear a ZIO patch monitor for 7 days.  This is a single patch monitor. [employer] supplies one patch monitor per enrollment. Additional stickers are not available. Please do not apply patch if you will be having a Nuclear Stress Test,  Echocardiogram, Cardiac CT, MRI, or Chest Xray during the period you would be wearing the  monitor. The patch cannot be worn during these tests. You cannot remove and re-apply the  ZIO XT patch monitor.  Your ZIO patch monitor will be mailed 3 day USPS to your address on file. It may take 3-5 days  to receive your monitor after you have been enrolled.  Once you have received your monitor, please review the enclosed instructions. Your monitor  has already been registered assigning a specific monitor serial # to you.  Billing and Patient Assistance Program Information  We have supplied [employer] with any of your insurance information on file for billing purposes. [employer] offers a sliding scale Patient Assistance Program for patients that do not have  insurance, or whose insurance does not completely cover the cost of the ZIO monitor.  You must apply for the Patient Assistance Program to qualify for this discounted rate.  To apply, please call [employer] at [phone], select option 4, select option 2, ask to apply for  Patient Assistance Program. Meredeth Ide will ask your household income, and how many people  are in your household. They will quote your out-of-pocket cost based on that information.  [employer] will also be able to set up a 37-month, interest-free payment plan if  needed.  Applying the monitor   Shave hair from upper left chest.  Hold abrader disc by orange tab. Rub abrader in 40 strokes over the upper left chest as  indicated in your monitor instructions.  Clean area with 4 enclosed alcohol pads. Let dry.  Apply patch as indicated in monitor instructions. Patch will be placed under collarbone on left  side of chest with arrow pointing upward.  Rub patch adhesive wings for 2 minutes. Remove white label marked "1". Remove the white  label marked "2". Rub patch adhesive wings for 2 additional minutes.  While looking in a mirror, press and release button in center of patch. A small green light will  flash 3-4 times. This will be your only indicator that the monitor has been turned on.  Do not shower for the first 24 hours. You may shower after the first 24 hours.  Press the button if you feel a symptom. You will hear a small click. Record Date, Time and  Symptom in the Patient Logbook.  When you are ready to remove the patch, follow instructions on the last 2 pages of Patient  Logbook. Stick patch monitor onto the last page of Patient Logbook.  Place Patient Logbook in the blue and white box. Use locking tab on box and tape box closed  securely. The blue and white box has prepaid postage on it. Please place it in the mailbox as  soon as possible. Your physician should have your test results approximately 7 days after the  monitor has been mailed back to [facility].  Call [facility] Customer Care at [phone] if you have questions regarding  your ZIO XT patch monitor. Call them immediately if you see an orange light blinking on your  monitor.  If your monitor falls off in less than 4 days, contact our Monitor department at [phone].  If your monitor becomes loose or falls off after 4 days call [employer] at [phone] for  suggestions on securing your monitor    Follow-Up: At [facility], you and your health needs are  our priority.  As part of our continuing mission to provide you with exceptional heart care, we have created designated Provider Care Teams.  These Care Teams include your primary Cardiologist (physician) and Advanced Practice Providers (APPs -  Physician Assistants and Nurse Practitioners) who all work together to provide you with the care you need, when you need it.   Your next appointment:   6 week(s)  Provider:   Thomasene Ripple, DO

## 2022-12-26 NOTE — Progress Notes (Signed)
Cardio-Obstetrics Clinic  New Evaluation  Date:  12/26/2022   ID:  Natasha Beard, DOB [birth year], MRN [id number]  PCP:  [facility] Providers Cardiologist:  Thomasene Ripple, DO  Electrophysiologist:  None       Referring MD: [facility] Petit*   Chief Complaint: " I have had skip beats"   History of Present Illness:    Natasha Beard is a 27 y.o. female [G1P0000] who is being seen today for the evaluation of skip beats  at the request of [place]   She is here today because she was referred from the emergency department at [facility], she tells me that she has had some skipped beats and significant palpitations.  She was seen at the emergency department.  She denies any chest pain or shortness of breath.  She tells me this has been going on for years.   Prior CV Studies Reviewed: The following studies were reviewed today: None  No past medical history on file.  No past surgical history on file.    OB History     Gravida  1   Para  0   Term  0   Preterm  0   AB  0   Living  0      SAB  0   IAB  0   Ectopic  0   Multiple  0   Live Births  0               Current Medications: Current Meds  Medication Sig   albuterol (PROVENTIL HFA;VENTOLIN HFA) 108 (90 Base) MCG/ACT inhaler Inhale 1-2 puffs into the lungs every 6 (six) hours as needed for wheezing or shortness of breath.   aspirin EC 81 MG tablet Take 1 tablet (81 mg total) by mouth daily. Swallow whole.   famotidine (PEPCID) 20 MG tablet Take 1 tablet (20 mg total) by mouth daily.   ondansetron (ZOFRAN-ODT) 4 MG disintegrating tablet Take 1 tablet (4 mg total) by mouth every 8 (eight) hours as needed for nausea or vomiting.   Spacer/Aero-Holding Chambers (AEROCHAMBER PLUS) inhaler Use as instructed     Allergies:   Apple juice, Chocolate, and Erythromycin   Social History   Socioeconomic History   Marital status: Single     Spouse name: Not on file   Number of children: Not on file   Years of education: Not on file   Highest education level: Not on file  Occupational History   Not on file  Tobacco Use   Smoking status: Never   Smokeless tobacco: Never  Vaping Use   Vaping Use: Never used  Substance and Sexual Activity   Alcohol use: Not Currently   Drug use: Not Currently    Types: Marijuana   Sexual activity: Yes  Other Topics Concern   Not on file  Social History Narrative   Not on file   Social Determinants of Health   Financial Resource Strain: Not on file  Food Insecurity: Food Insecurity Present (11/22/2022)   Hunger Vital Sign    Worried About Running Out of Food in the Last Year: Sometimes true    Ran Out of Food in the Last Year: Never true  Transportation Needs: No Transportation Needs (11/22/2022)   PRAPARE - Administrator, Civil Service (Medical): No    Lack of Transportation (Non-Medical): No  Physical Activity: Not on file  Stress: Not on file  Social Connections: Not on file      Family History  Problem Relation Age of Onset   Healthy Mother    Diabetes Father    Seizures Father    Breast cancer Maternal Grandmother    Breast cancer Maternal Aunt       ROS:   Please see the history of present illness.    Palpitations All other systems reviewed and are negative.   Labs/EKG Reviewed:    EKG:   EKG was ordered today.  The ekg ordered today demonstrates sinus rhythm with arrhythmia.  Recent Labs: 12/11/2022: ALT 16; BUN <5; Creatinine, Ser 0.44; Hemoglobin 14.0; Platelets 170; Potassium 3.4; Sodium 136   Recent Lipid Panel No results found for: "CHOL", "TRIG", "HDL", "CHOLHDL", "LDLCALC", "LDLDIRECT"  Physical Exam:    VS:  BP 114/68 (BP Location: Left Arm, Patient Position: Sitting, Cuff Size: Normal)   Pulse 84   Ht 5\' 7"  (1.702 m)   Wt 187 lb (84.8 kg)   LMP 08/31/2022 (Exact Date) Comment: [redacted] weeks pregnant  SpO2 98%   BMI 29.29 kg/m     Wt  Readings from Last 3 Encounters:  12/26/22 187 lb (84.8 kg)  12/20/22 187 lb (84.8 kg)  12/05/22 189 lb (85.7 kg)     GEN:  Well nourished, well developed in no acute distress HEENT: Normal NECK: No JVD; No carotid bruits LYMPHATICS: No lymphadenopathy CARDIAC: RRR, no murmurs, rubs, gallops RESPIRATORY:  Clear to auscultation without rales, wheezing or rhonchi  ABDOMEN: Soft, non-tender, non-distended MUSCULOSKELETAL:  No edema; No deformity  SKIN: Warm and dry NEUROLOGIC:  Alert and oriented x 3 PSYCHIATRIC:  Normal affect    Risk Assessment/Risk Calculators:     CARPREG II Risk Prediction Index Score:  1.  The patient's risk for a primary cardiac event is 5%.            ASSESSMENT & PLAN:    Palpitation  I would like to rule out a cardiovascular etiology of this palpitation, therefore at this time I would like to placed a zio patch for  7  days. For now, I do reccomend that the patient goes to the nearest ED if  symptoms recur.  Continue aspirin for preeclampsia prophylaxis.  The patient is in agreement with the above plan. The patient left the office in stable condition.  The patient will follow up in 6 weeks.  Patient Instructions  Medication Instructions:  Your physician recommends that you continue on your current medications as directed. Please refer to the Current Medication list given to you today.  *If you need a refill on your cardiac medications before your next appointment, please call your pharmacy*   Lab Work: None   Testing/Procedures: Christena Deem- Long Term Monitor Instructions  Your physician has requested you wear a ZIO patch monitor for 7 days.  This is a single patch monitor. [employer] supplies one patch monitor per enrollment. Additional stickers are not available. Please do not apply patch if you will be having a Nuclear Stress Test,  Echocardiogram, Cardiac CT, MRI, or Chest Xray during the period you would be wearing the  monitor. The patch  cannot be worn during these tests. You cannot remove and re-apply the  ZIO XT patch monitor.  Your ZIO patch monitor will be mailed 3 day USPS to your address on file. It may take 3-5 days  to receive your monitor after you have been enrolled.  Once you have received your monitor, please review the enclosed  instructions. Your monitor  has already been registered assigning a specific monitor serial # to you.  Billing and Patient Assistance Program Information  We have supplied [employer] with any of your insurance information on file for billing purposes. [employer] offers a sliding scale Patient Assistance Program for patients that do not have  insurance, or whose insurance does not completely cover the cost of the ZIO monitor.  You must apply for the Patient Assistance Program to qualify for this discounted rate.  To apply, please call [employer] at [phone], select option 4, select option 2, ask to apply for  Patient Assistance Program. Meredeth Ide will ask your household income, and how many people  are in your household. They will quote your out-of-pocket cost based on that information.  [employer] will also be able to set up a 1-month, interest-free payment plan if needed.  Applying the monitor   Shave hair from upper left chest.  Hold abrader disc by orange tab. Rub abrader in 40 strokes over the upper left chest as  indicated in your monitor instructions.  Clean area with 4 enclosed alcohol pads. Let dry.  Apply patch as indicated in monitor instructions. Patch will be placed under collarbone on left  side of chest with arrow pointing upward.  Rub patch adhesive wings for 2 minutes. Remove white label marked "1". Remove the white  label marked "2". Rub patch adhesive wings for 2 additional minutes.  While looking in a mirror, press and release button in center of patch. A small green light will  flash 3-4 times. This will be your only indicator that the monitor has been turned on.  Do not  shower for the first 24 hours. You may shower after the first 24 hours.  Press the button if you feel a symptom. You will hear a small click. Record Date, Time and  Symptom in the Patient Logbook.  When you are ready to remove the patch, follow instructions on the last 2 pages of Patient  Logbook. Stick patch monitor onto the last page of Patient Logbook.  Place Patient Logbook in the blue and white box. Use locking tab on box and tape box closed  securely. The blue and white box has prepaid postage on it. Please place it in the mailbox as  soon as possible. Your physician should have your test results approximately 7 days after the  monitor has been mailed back to [facility].  Call [facility] Customer Care at [phone] if you have questions regarding  your ZIO XT patch monitor. Call them immediately if you see an orange light blinking on your  monitor.  If your monitor falls off in less than 4 days, contact our Monitor department at [phone].  If your monitor becomes loose or falls off after 4 days call [employer] at [phone] for  suggestions on securing your monitor    Follow-Up: At [facility], you and your health needs are our priority.  As part of our continuing mission to provide you with exceptional heart care, we have created designated Provider Care Teams.  These Care Teams include your primary Cardiologist (physician) and Advanced Practice Providers (APPs -  Physician Assistants and Nurse Practitioners) who all work together to provide you with the care you need, when you need it.   Your next appointment:   6 week(s)  Provider:   Thomasene Ripple, DO     Dispo:  No follow-ups on file.   Medication Adjustments/Labs and Tests Ordered: Current medicines are reviewed at  length with the patient today.  Concerns regarding medicines are outlined above.  Tests Ordered: Orders Placed This Encounter  Procedures   LONG TERM MONITOR (3-14 DAYS)   EKG 12-Lead    Medication Changes: No orders of the defined types were placed in this encounter.

## 2022-12-27 ENCOUNTER — Encounter: Payer: Self-pay | Admitting: Cardiology

## 2022-12-28 ENCOUNTER — Encounter: Payer: Self-pay | Admitting: General Practice

## 2022-12-30 DIAGNOSIS — R002 Palpitations: Secondary | ICD-10-CM | POA: Diagnosis not present

## 2023-01-02 ENCOUNTER — Encounter: Payer: Self-pay | Admitting: Certified Nurse Midwife

## 2023-01-04 ENCOUNTER — Telehealth: Payer: Self-pay

## 2023-01-04 NOTE — Telephone Encounter (Signed)
Babyscripts alert received. Babyscripts portal review shows recheck with normal BP, see below. Called pt to follow up on reported symptoms. VM left with callback number if patient desires.  [place], Babyscripts  P [facility]-Babyscripts Htn Md; P [facility] Clinical Pool Babyscripts Trigger Notification for Natasha Beard, Natasha Beard Trigger Severity: Elevated Blood Pressure Value: 140/81 Reading Date: 2023-01-02 Symptoms: Nausea/Vomiting, Abnormal Vision

## 2023-01-09 ENCOUNTER — Encounter: Payer: Self-pay | Admitting: Obstetrics & Gynecology

## 2023-01-12 DIAGNOSIS — F1290 Cannabis use, unspecified, uncomplicated: Secondary | ICD-10-CM | POA: Insufficient documentation

## 2023-01-16 NOTE — Progress Notes (Unsigned)
   PRENATAL VISIT NOTE  Subjective:  Natasha Beard is a 27 y.o. G1P0000 at [redacted]w[redacted]d being seen today for ongoing prenatal care.  She is currently monitored for the following issues for this {Blank single:19197::"high-risk","low-risk"} pregnancy and has Supervision of low-risk pregnancy; Palpitations; and Marijuana use during pregnancy on their problem list.  Patient reports {sx:14538}.   .  .   . Denies leaking of fluid.   The following portions of the patient's history were reviewed and updated as appropriate: allergies, current medications, past family history, past medical history, past social history, past surgical history and problem list.   Objective:  There were no vitals filed for this visit.  Fetal Status:           General:  Alert, oriented and cooperative. Patient is in no acute distress.  Skin: Skin is warm and dry. No rash noted.   Cardiovascular: Normal heart rate noted  Respiratory: Normal respiratory effort, no problems with respiration noted  Abdomen: Soft, gravid, appropriate for gestational age.        Pelvic: {Blank single:19197::"Cervical exam performed in the presence of a chaperone","Cervical exam deferred"}        Extremities: Normal range of motion.     Mental Status: Normal mood and affect. Normal behavior. Normal judgment and thought content.   Assessment and Plan:  Pregnancy: G1P0000 at [redacted]w[redacted]d 1. Encounter for supervision of low-risk pregnancy in second trimester ***  2. [redacted] weeks gestation of pregnancy ***  {Blank single:19197::"Term","Preterm"} labor symptoms and general obstetric precautions including but not limited to vaginal bleeding, contractions, leaking of fluid and fetal movement were reviewed in detail with the patient. Please refer to After Visit Summary for other counseling recommendations.   No follow-ups on file.  Future Appointments  Date Time Provider Department Center  01/17/2023  8:15 AM [facility] NURSE [facility]  01/17/2023  8:30 AM  [facility] US3 [facility]  01/17/2023 10:35 AM Bernerd Limbo, CNM [facility]  02/08/2023  9:00 AM Thomasene Ripple, DO [facility] None  02/14/2023 11:15 AM Bernerd Limbo, CNM [facility]    Bernerd Limbo, CNM

## 2023-01-17 ENCOUNTER — Encounter: Payer: Self-pay | Admitting: *Deleted

## 2023-01-17 ENCOUNTER — Ambulatory Visit (INDEPENDENT_AMBULATORY_CARE_PROVIDER_SITE_OTHER): Payer: Commercial Managed Care - HMO | Admitting: Certified Nurse Midwife

## 2023-01-17 ENCOUNTER — Ambulatory Visit: Payer: Commercial Managed Care - HMO | Attending: Certified Nurse Midwife

## 2023-01-17 ENCOUNTER — Ambulatory Visit: Payer: Commercial Managed Care - HMO

## 2023-01-17 ENCOUNTER — Other Ambulatory Visit: Payer: Self-pay

## 2023-01-17 VITALS — BP 123/58 | HR 81

## 2023-01-17 VITALS — BP 119/69 | HR 76 | Wt 200.0 lb

## 2023-01-17 DIAGNOSIS — Z3A19 19 weeks gestation of pregnancy: Secondary | ICD-10-CM | POA: Insufficient documentation

## 2023-01-17 DIAGNOSIS — O99322 Drug use complicating pregnancy, second trimester: Secondary | ICD-10-CM | POA: Diagnosis not present

## 2023-01-17 DIAGNOSIS — F1290 Cannabis use, unspecified, uncomplicated: Secondary | ICD-10-CM

## 2023-01-17 DIAGNOSIS — Z363 Encounter for antenatal screening for malformations: Secondary | ICD-10-CM | POA: Insufficient documentation

## 2023-01-17 DIAGNOSIS — O99320 Drug use complicating pregnancy, unspecified trimester: Secondary | ICD-10-CM | POA: Insufficient documentation

## 2023-01-17 DIAGNOSIS — Z3492 Encounter for supervision of normal pregnancy, unspecified, second trimester: Secondary | ICD-10-CM | POA: Insufficient documentation

## 2023-01-20 ENCOUNTER — Encounter: Payer: Self-pay | Admitting: Certified Nurse Midwife

## 2023-01-21 ENCOUNTER — Inpatient Hospital Stay (HOSPITAL_COMMUNITY)
Admission: AD | Admit: 2023-01-21 | Discharge: 2023-01-21 | Disposition: A | Discharge status: Home / Self Care | Payer: Commercial Managed Care - HMO | Attending: Obstetrics and Gynecology | Admitting: Obstetrics and Gynecology

## 2023-01-21 DIAGNOSIS — O99612 Diseases of the digestive system complicating pregnancy, second trimester: Secondary | ICD-10-CM | POA: Diagnosis not present

## 2023-01-21 DIAGNOSIS — K2100 Gastro-esophageal reflux disease with esophagitis, without bleeding: Secondary | ICD-10-CM | POA: Diagnosis not present

## 2023-01-21 DIAGNOSIS — K219 Gastro-esophageal reflux disease without esophagitis: Secondary | ICD-10-CM | POA: Insufficient documentation

## 2023-01-21 DIAGNOSIS — O219 Vomiting of pregnancy, unspecified: Secondary | ICD-10-CM | POA: Diagnosis present

## 2023-01-21 DIAGNOSIS — Z3A20 20 weeks gestation of pregnancy: Secondary | ICD-10-CM | POA: Insufficient documentation

## 2023-01-21 MED ORDER — ALUM & MAG HYDROXIDE-SIMETH 200-200-20 MG/5ML PO SUSP
30.0000 mL | Freq: Once | ORAL | Status: AC
  Administered 2023-01-21: 30 mL via ORAL
  Filled 2023-01-21: qty 30

## 2023-01-21 MED ORDER — FAMOTIDINE 20 MG PO TABS: 20.0000 mg | ORAL_TABLET | Freq: Two times a day (BID) | ORAL | 3 refills | Status: DC

## 2023-01-21 MED ORDER — ONDANSETRON 4 MG PO TBDP
8.0000 mg | ORAL_TABLET | Freq: Once | ORAL | Status: AC
  Administered 2023-01-21: 8 mg via ORAL
  Filled 2023-01-21: qty 2

## 2023-01-21 MED ORDER — ONDANSETRON 4 MG PO TBDP: 4.0000 mg | ORAL_TABLET | Freq: Three times a day (TID) | ORAL | 1 refills | Status: DC | PRN

## 2023-01-21 NOTE — Discharge Instructions (Signed)

## 2023-01-21 NOTE — MAU Note (Signed)
.  Natasha Beard is a 27 y.o. at [redacted]w[redacted]d here in MAU reporting: since 2200 yesterday started having burning sensation, and she took her heartburn medication @ 2300. Pt reports 3 episodes of emesis, mixed with blood. Pt reports she had slice of pizza, salad, and fruit today. Pt reports seeing some floaters with vomiting. Pt denies VB or LOF.   Onset of complaint: 2220 Pain score: 9/10 Vitals:   01/21/23 0328  BP: (!) 120/57  Pulse: (!) 103  Resp: 18  Temp: 98.5 F (36.9 C)  SpO2: 99%     FHT:164 Lab orders placed from triage:  UA

## 2023-01-21 NOTE — MAU Provider Note (Addendum)
History     CSN: [id number]  Arrival date and time: 01/21/23 4166   Event Date/Time   First Provider Initiated Contact with Patient 01/21/23 [phone]      Chief Complaint  Patient presents with   Emesis   Nausea    Natasha Beard is a 27 y.o. G1P0 at [redacted]w[redacted]d who receives care at [facility].  She presents today for nausea and vomiting.  Patient states she had an incident of vomiting today around 10 PM and noted blood.  She said that she also started to experience burning in her chest that she rates a 9.5/10.  Patient endorses a history of acid reflux and states that she was taking Pepcid daily, but discontinued about a week ago.  She states she noted the blood was "cherry red" and that it occurred every time she vomited, which was 3 times tonight.  She states the first time the blood was noted after the food, second time it was just below the, and a third time it was blood mixed with fluid.  Patient denies vaginal concerns including bleeding and leaking.  She reports history of constipation but states that has resolved.  OB History     Gravida  1   Para  0   Term  0   Preterm  0   AB  0   Living  0      SAB  0   IAB  0   Ectopic  0   Multiple  0   Live Births  0           Past Medical History:  Diagnosis Date   Anxiety    Depression     Past Surgical History:  Procedure Laterality Date   NO PAST SURGERIES      Family History  Problem Relation Age of Onset   Healthy Mother    Diabetes Father    Seizures Father    Breast cancer Maternal Grandmother    Breast cancer Maternal Aunt     Social History   Tobacco Use   Smoking status: Never   Smokeless tobacco: Never  Vaping Use   Vaping status: Never Used  Substance Use Topics   Alcohol use: Not Currently   Drug use: Not Currently    Types: Marijuana    Comment: +UDS 12/11/22 THC    Allergies:  Allergies  Allergen Reactions   Apple Juice    Chocolate     In moderation   Erythromycin Swelling     Medications Prior to Admission  Medication Sig Dispense Refill Last Dose   albuterol (PROVENTIL HFA;VENTOLIN HFA) 108 (90 Base) MCG/ACT inhaler Inhale 1-2 puffs into the lungs every 6 (six) hours as needed for wheezing or shortness of breath. 1 Inhaler 0    aspirin EC 81 MG tablet Take 1 tablet (81 mg total) by mouth daily. Swallow whole. 30 tablet 12    famotidine (PEPCID) 20 MG tablet Take 1 tablet (20 mg total) by mouth daily. 30 tablet 0    ondansetron (ZOFRAN-ODT) 4 MG disintegrating tablet Take 1 tablet (4 mg total) by mouth every 8 (eight) hours as needed for nausea or vomiting. 20 tablet 0    Prenatal Vit-Fe Fumarate-FA (PRENATAL MULTIVITAMIN) TABS tablet Take 1 tablet by mouth daily at 12 noon.      Prenatal Vit-Fe Phos-FA-Omega (VITAFOL GUMMIES) 3.33-0.333-34.8 MG CHEW Chew 3 tablets by mouth daily. (Patient not taking: Reported on 12/26/2022) 90 tablet 11    Spacer/Aero-Holding Chambers (AEROCHAMBER PLUS)  inhaler Use as instructed 1 each 2     Review of Systems  Constitutional:  Negative for chills and fever.  Eyes:  Positive for visual disturbance (After vomiting).  Cardiovascular:  Positive for chest pain (Burning).  Gastrointestinal:  Positive for nausea and vomiting. Negative for abdominal pain, constipation and diarrhea.  Neurological:  Negative for dizziness, light-headedness and headaches.   Physical Exam   Blood pressure (!) 120/57, pulse (!) 103, temperature 98.5 F (36.9 C), temperature source Oral, resp. rate 18, height 5\' 7"  (1.702 m), weight 87.9 kg, last menstrual period 08/31/2022, SpO2 99%.  Physical Exam Vitals reviewed. Exam conducted with a chaperone present.  Constitutional:      Appearance: Normal appearance.  HENT:     Head: Normocephalic and atraumatic.  Eyes:     Conjunctiva/sclera: Conjunctivae normal.  Cardiovascular:     Rate and Rhythm: Normal rate.  Pulmonary:     Effort: Pulmonary effort is normal. No respiratory distress.  Abdominal:      Palpations: Abdomen is soft.     Tenderness: There is no abdominal tenderness.  Musculoskeletal:        General: Normal range of motion.     Cervical back: Normal range of motion.  Skin:    General: Skin is warm and dry.  Neurological:     Mental Status: She is alert and oriented to person, place, and time.  Psychiatric:        Mood and Affect: Mood normal.        Behavior: Behavior normal.   FHR: 164 by doppler  MAU Course  Procedures No results found for this or any previous visit (from the past 24 hour(s)).  MDM Exam Antiemetic Antiacid Prescription Assessment and Plan  27 year old, G 1 P 0 SIUP at 20.3 weeks GERD Nausea  -Reviewed POC with patient. -Exam performed.  -Discussed need to take medications regularly. -Reviewed nutritional intake that can cause acid reflux to worsen including fried, acidic, and spicy. -Discussed giving medication for nausea f/b GI cocktail. -Patient agreeable. -Monitor and reassess.  Cherre Robins 01/21/2023, 3:36 AM   Reassessment (4:22 AM) -Reports resolution of pain with GI cocktail. -Discussed management of GERD at home. -Instructed to take Pepcid twice daily and to supplement with Tums and/or Mylanta as needed. -Patient requesting given refill of Zofran. -List of pregnancy safe medications provided in AVS. -Keep next appointment as scheduled. -Encouraged to call primary office or return to MAU if symptoms worsen or with the onset of new symptoms. -Discharged to home in improved condition.  Cherre Robins MSN, CNM Advanced Practice Provider, [facility] [employer]

## 2023-02-05 ENCOUNTER — Encounter: Payer: Self-pay | Admitting: *Deleted

## 2023-02-06 ENCOUNTER — Inpatient Hospital Stay (HOSPITAL_COMMUNITY)
Admission: AD | Admit: 2023-02-06 | Discharge: 2023-02-06 | Disposition: A | Discharge status: Home / Self Care | Payer: Commercial Managed Care - HMO | Attending: Obstetrics and Gynecology | Admitting: Obstetrics and Gynecology

## 2023-02-06 DIAGNOSIS — O99512 Diseases of the respiratory system complicating pregnancy, second trimester: Secondary | ICD-10-CM | POA: Diagnosis not present

## 2023-02-06 DIAGNOSIS — Z1152 Encounter for screening for COVID-19: Secondary | ICD-10-CM | POA: Diagnosis not present

## 2023-02-06 DIAGNOSIS — J069 Acute upper respiratory infection, unspecified: Secondary | ICD-10-CM | POA: Diagnosis not present

## 2023-02-06 DIAGNOSIS — O98512 Other viral diseases complicating pregnancy, second trimester: Secondary | ICD-10-CM | POA: Diagnosis not present

## 2023-02-06 DIAGNOSIS — B9789 Other viral agents as the cause of diseases classified elsewhere: Secondary | ICD-10-CM | POA: Diagnosis not present

## 2023-02-06 DIAGNOSIS — R059 Cough, unspecified: Secondary | ICD-10-CM | POA: Diagnosis not present

## 2023-02-06 DIAGNOSIS — O26892 Other specified pregnancy related conditions, second trimester: Secondary | ICD-10-CM | POA: Diagnosis present

## 2023-02-06 DIAGNOSIS — Z3A22 22 weeks gestation of pregnancy: Secondary | ICD-10-CM | POA: Diagnosis not present

## 2023-02-06 LAB — GROUP A STREP BY PCR: Group A Strep by PCR: NOT DETECTED

## 2023-02-06 LAB — SARS CORONAVIRUS 2 BY RT PCR: SARS Coronavirus 2 by RT PCR: NEGATIVE

## 2023-02-06 NOTE — MAU Provider Note (Addendum)
History     CSN: [id number]  Arrival date and time: 02/06/23 1004   Event Date/Time   First Provider Initiated Contact with Patient 02/06/23 1041      Chief Complaint  Patient presents with   Sore Throat   Nasal Congestion   Cough    HPI: Natasha Beard is a 27 y.o. G1P0000 at [redacted]w[redacted]d who presents with complaints of cough, sore throat, congestion which all started this morning. She reports being around her mother-in-law who was sick about 3 days ago. Her mother-in-law took a COVID test and is awaiting results. She otherwise denies any fevers, chills, headaches, nausea, vomiting, chest pain, palpitations, SOB, abdominal pain, constipation, diarrhea, or urinary sxs. No VB, LOF, ctx. Has good FM. She has not tried any meds for her symptoms. She has been increasing hydration and denies any decreased urine output.  Past Medical History:  Diagnosis Date   Anxiety    Depression     Past Surgical History:  Procedure Laterality Date   NO PAST SURGERIES      Family History  Problem Relation Age of Onset   Healthy Mother    Diabetes Father    Seizures Father    Breast cancer Maternal Grandmother    Breast cancer Maternal Aunt     Social History   Tobacco Use   Smoking status: Never   Smokeless tobacco: Never  Vaping Use   Vaping status: Never Used  Substance Use Topics   Alcohol use: Not Currently   Drug use: Not Currently    Types: Marijuana    Comment: +UDS 12/11/22 THC    Allergies:  Allergies  Allergen Reactions   Apple Juice    Chocolate     In moderation   Erythromycin Swelling    Medications Prior to Admission  Medication Sig Dispense Refill Last Dose   aspirin EC 81 MG tablet Take 1 tablet (81 mg total) by mouth daily. Swallow whole. 30 tablet 12 02/05/2023   famotidine (PEPCID) 20 MG tablet Take 1 tablet (20 mg total) by mouth 2 (two) times daily. 60 tablet 3 02/05/2023   ondansetron (ZOFRAN-ODT) 4 MG disintegrating tablet Take 1 tablet (4 mg total) by mouth  every 8 (eight) hours as needed for nausea or vomiting. 30 tablet 1 02/06/2023   Prenatal Vit-Fe Fumarate-FA (PRENATAL MULTIVITAMIN) TABS tablet Take 1 tablet by mouth daily at 12 noon.   02/05/2023   albuterol (PROVENTIL HFA;VENTOLIN HFA) 108 (90 Base) MCG/ACT inhaler Inhale 1-2 puffs into the lungs every 6 (six) hours as needed for wheezing or shortness of breath. 1 Inhaler 0 More than a month   Prenatal Vit-Fe Phos-FA-Omega (VITAFOL GUMMIES) 3.33-0.333-34.8 MG CHEW Chew 3 tablets by mouth daily. (Patient not taking: Reported on 12/26/2022) 90 tablet 11    Spacer/Aero-Holding Chambers (AEROCHAMBER PLUS) inhaler Use as instructed 1 each 2 More than a month    Review of Systems  Constitutional:  Negative for chills and fever.  HENT:  Positive for congestion, rhinorrhea and sore throat.   Respiratory:  Negative for chest tightness, shortness of breath and wheezing.   Cardiovascular:  Negative for chest pain and palpitations.  Gastrointestinal:  Negative for abdominal pain, constipation, diarrhea, nausea and vomiting.  Genitourinary:  Negative for decreased urine volume, dysuria, frequency and urgency.  Skin:  Negative for rash.  Neurological:  Negative for dizziness, weakness, numbness and headaches.   Physical Exam   Blood pressure 117/68, pulse 87, temperature 98.6 F (37 C), temperature source Oral, resp. rate  15, height 5\' 7"  (1.702 m), weight 87.9 kg, last menstrual period 08/31/2022, SpO2 100%.  Physical Exam Constitutional:      General: She is not in acute distress.    Appearance: She is well-developed. She is not ill-appearing or toxic-appearing.  HENT:     Head: Normocephalic and atraumatic.     Nose: Congestion present.     Mouth/Throat:     Mouth: Mucous membranes are moist.     Pharynx: Oropharynx is clear.  Eyes:     Extraocular Movements:     Right eye: Normal extraocular motion.     Left eye: Normal extraocular motion.  Cardiovascular:     Rate and Rhythm: Normal rate  and regular rhythm.     Heart sounds: Normal heart sounds.  Pulmonary:     Effort: Pulmonary effort is normal. No respiratory distress.     Breath sounds: Normal breath sounds. No wheezing.  Abdominal:     General: There is no distension.     Palpations: Abdomen is soft.     Tenderness: There is no abdominal tenderness.  Lymphadenopathy:     Cervical: No cervical adenopathy.  Skin:    General: Skin is warm and dry.     Findings: No rash.  Neurological:     General: No focal deficit present.     Mental Status: She is alert and oriented to person, place, and time.   FHT: 156  MAU Course  Procedures  MDM Natasha Beard is a 27 y.o. G1P0000 at [redacted]w[redacted]d with new onset of URI symptoms that started this AM. Endorses sick contacts -- MIL -- unsure of her MIL's COVID test results. Her exam is reassuring and vital signs are stable. We obtained a COVID swab. Discussed importance of hydration, symptomatic management with patient. Return precautions reviewed. Patient stable for discharge, will call patient with results of COVID test.   11:42 AM  Strep and COVID tests both negative. Unable to reach patient over phone at number provided, sent pt MyChart message with results. Recommended retesting for COVID in 2-3 days, symptomatic management, outlined return precautions.  Assessment and Plan  Viral upper respiratory illness - Plan: Discharge patient  Patient discussed with Dr. Donavan Foil.  Sundra Aland, MD OB Fellow, Faculty Practice [facility], Center for [facility]

## 2023-02-06 NOTE — MAU Note (Signed)
Natasha Beard is a 27 y.o. at [redacted]w[redacted]d here in MAU reporting: woke up with at cold today. Sore throat, cough-productive (clear mucous), stuffy nose, congestion in chest.  Denies fever.  Was around family this weekend, was sick- tested for covid, no results yet Pt has not taken anything Denies vag bleeding, leaking, reports +FM.  Onset of complaint: this morning Pain score: none Vitals:   02/06/23 1015 02/06/23 1016  BP:  117/68  Pulse:  87  Resp:  15  Temp:  98.6 F (37 C)  SpO2: 99% 100%     FHT:156 Lab orders placed from triage:  covid swab collected

## 2023-02-06 NOTE — Progress Notes (Signed)
Strep and COVID negative.

## 2023-02-06 NOTE — Discharge Instructions (Signed)
Colds/Coughs/Allergies: Benadryl (alcohol free) 25 mg every 6 hours as needed Breath right strips Claritin Cepacol throat lozenges Chloraseptic throat spray Cold-Eeze- up to three times per day Cough drops, alcohol free Flonase (by prescription only) Guaifenesin Mucinex Robitussin DM (plain only, alcohol free) Saline nasal spray/drops Sudafed (pseudoephedrine) & Actifed ** use only after [redacted] weeks gestation and if you do not have high blood pressure Tylenol Vicks Vaporub Zinc lozenges Zyrtec

## 2023-02-07 ENCOUNTER — Encounter (HOSPITAL_BASED_OUTPATIENT_CLINIC_OR_DEPARTMENT_OTHER): Payer: Self-pay

## 2023-02-07 ENCOUNTER — Other Ambulatory Visit: Payer: Self-pay

## 2023-02-07 ENCOUNTER — Emergency Department (HOSPITAL_BASED_OUTPATIENT_CLINIC_OR_DEPARTMENT_OTHER): Payer: Commercial Managed Care - HMO

## 2023-02-07 ENCOUNTER — Emergency Department (HOSPITAL_BASED_OUTPATIENT_CLINIC_OR_DEPARTMENT_OTHER)
Admission: EM | Admit: 2023-02-07 | Discharge: 2023-02-07 | Disposition: A | Discharge status: Home / Self Care | Payer: Commercial Managed Care - HMO | Attending: Emergency Medicine | Admitting: Emergency Medicine

## 2023-02-07 DIAGNOSIS — M791 Myalgia, unspecified site: Secondary | ICD-10-CM | POA: Diagnosis present

## 2023-02-07 DIAGNOSIS — U071 COVID-19: Secondary | ICD-10-CM | POA: Insufficient documentation

## 2023-02-07 DIAGNOSIS — R109 Unspecified abdominal pain: Secondary | ICD-10-CM

## 2023-02-07 DIAGNOSIS — Z7982 Long term (current) use of aspirin: Secondary | ICD-10-CM | POA: Diagnosis not present

## 2023-02-07 LAB — COMPREHENSIVE METABOLIC PANEL
ALT: 25 U/L (ref 0–44)
AST: 22 U/L (ref 15–41)
Albumin: 3.1 g/dL — ABNORMAL LOW (ref 3.5–5.0)
Alkaline Phosphatase: 61 U/L (ref 38–126)
Anion gap: 10 (ref 5–15)
BUN: 5 mg/dL — ABNORMAL LOW (ref 6–20)
CO2: 19 mmol/L — ABNORMAL LOW (ref 22–32)
Calcium: 8.7 mg/dL — ABNORMAL LOW (ref 8.9–10.3)
Chloride: 104 mmol/L (ref 98–111)
Creatinine, Ser: 0.41 mg/dL — ABNORMAL LOW (ref 0.44–1.00)
GFR, Estimated: >60 mL/min (ref >60)
Glucose, Bld: 76 mg/dL (ref 70–99)
Potassium: 3.7 mmol/L (ref 3.5–5.1)
Sodium: 133 mmol/L — ABNORMAL LOW (ref 135–145)
Total Bilirubin: 0.5 mg/dL (ref 0.3–1.2)
Total Protein: 6.7 g/dL (ref 6.5–8.1)

## 2023-02-07 LAB — CBC WITH DIFFERENTIAL/PLATELET
Abs Immature Granulocytes: 0.05 10*3/uL (ref 0.00–0.07)
Basophils Absolute: 0 10*3/uL (ref 0.0–0.1)
Basophils Relative: 0 %
Eosinophils Absolute: 0 10*3/uL (ref 0.0–0.5)
Eosinophils Relative: 0 %
HCT: 38.1 % (ref 36.0–46.0)
Hemoglobin: 13.4 g/dL (ref 12.0–15.0)
Immature Granulocytes: 1 %
Lymphocytes Relative: 11 %
Lymphs Abs: 0.7 10*3/uL (ref 0.7–4.0)
MCH: 32 pg (ref 26.0–34.0)
MCHC: 35.2 g/dL (ref 30.0–36.0)
MCV: 90.9 fL (ref 80.0–100.0)
Monocytes Absolute: 0.9 10*3/uL (ref 0.1–1.0)
Monocytes Relative: 15 %
Neutro Abs: 4.5 10*3/uL (ref 1.7–7.7)
Neutrophils Relative %: 73 %
Platelets: 157 10*3/uL (ref 150–400)
RBC: 4.19 MIL/uL (ref 3.87–5.11)
RDW: 13.2 % (ref 11.5–15.5)
WBC: 6.2 10*3/uL (ref 4.0–10.5)
nRBC: 0 % (ref 0.0–0.2)

## 2023-02-07 LAB — RESP PANEL BY RT-PCR (RSV, FLU A&B, COVID)  RVPGX2
Influenza A by PCR: NEGATIVE
Influenza B by PCR: NEGATIVE
Resp Syncytial Virus by PCR: NEGATIVE
SARS Coronavirus 2 by RT PCR: POSITIVE — AB

## 2023-02-07 LAB — LIPASE, BLOOD: Lipase: 19 U/L (ref 11–51)

## 2023-02-07 LAB — GROUP A STREP BY PCR: Group A Strep by PCR: NOT DETECTED

## 2023-02-07 MED ORDER — SODIUM CHLORIDE 0.9 % IV BOLUS
500.0000 mL | Freq: Once | INTRAVENOUS | Status: AC
  Administered 2023-02-07: 500 mL via INTRAVENOUS

## 2023-02-07 NOTE — ED Triage Notes (Signed)
Patient here POV from Home.  Endorses Generalized Body Aches that began today and notes that it localizes to ABD. Somewhat Lower ABD and up Laterally.   Went to MAU yesterday for Flu-Like Symptoms and instructed to seek ED Evaluation for ABD Pain.   LMP: 03/14. EDD: 12/19. No Dysuria. No Fevers. Some N/V. No Diarrhea. First Pregnancy.   NAD Noted during Triage. A&Ox4. GCS 15. BIB Wheelchair.

## 2023-02-07 NOTE — Discharge Instructions (Signed)
It was a pleasure taking part in your care today.  As we discussed, you have COVID-19.  This is most likely the cause of your symptoms.  The ultrasound imaging that we did was reassuring.  Please follow-up with the MAU this week or next for reevaluation.  Please return to the ED with any new or worsening signs or symptoms.  For COVID, you may take Tylenol every 6 hours as needed for bodyaches and chills, fever, headache.  Please continue pushing fluids such as Pedialyte, Body Armor Gatorade.

## 2023-02-07 NOTE — ED Provider Notes (Signed)
[name] [facility] Provider Note   CSN: [id number] Arrival date & time: 02/07/23  1624     History  Chief Complaint  Patient presents with   Generalized Body Aches    Natasha Beard is a 27 y.o. female with medical history Zaidi, depression.  Patient presents to the ED for evaluation of abdominal pain in the setting of pregnancy.  Patient reports that she is [redacted] weeks pregnant, G1, P0.  She states that beginning this morning she developed crampy lower abdominal pain.  She states that yesterday she was seen at the MAU secondary to URI symptoms.  She had a negative COVID test and was sent home.  She reports that she does have sick contact, her boyfriend's mother was recently diagnosed with COVID-19.  She reports that her boyfriend who she lives with is currently experiencing symptoms to include fever, body aches and chills.  The patient reports that she is experiencing sore throat, cough, congestion.  She is also endorsing lower abdominal pain began this morning that does not radiate associated with nausea and vomiting, she denies any current nausea.  She denies diarrhea, dysuria, flank pain.  She denies vaginal discharge, vaginal bleeding, vaginal spotting.  HPI     Home Medications Prior to Admission medications   Medication Sig Start Date End Date Taking? Authorizing Provider  albuterol (PROVENTIL HFA;VENTOLIN HFA) 108 (90 Base) MCG/ACT inhaler Inhale 1-2 puffs into the lungs every 6 (six) hours as needed for wheezing or shortness of breath. 07/04/18   Domenick Gong, MD  aspirin EC 81 MG tablet Take 1 tablet (81 mg total) by mouth daily. Swallow whole. 11/22/22   Bernerd Limbo, CNM  famotidine (PEPCID) 20 MG tablet Take 1 tablet (20 mg total) by mouth 2 (two) times daily. 01/21/23   Gerrit Heck, CNM  ondansetron (ZOFRAN-ODT) 4 MG disintegrating tablet Take 1 tablet (4 mg total) by mouth every 8 (eight) hours as needed for nausea or vomiting. 01/21/23    Gerrit Heck, CNM  Prenatal Vit-Fe Fumarate-FA (PRENATAL MULTIVITAMIN) TABS tablet Take 1 tablet by mouth daily at 12 noon.    [provider]  Spacer/Aero-Holding Chambers (AEROCHAMBER PLUS) inhaler Use as instructed 07/04/18   Domenick Gong, MD      Allergies    Apple juice, Chocolate, and Erythromycin    Review of Systems   Review of Systems  Constitutional:  Positive for chills and fever.  HENT:  Positive for congestion and sore throat.   Respiratory:  Negative for shortness of breath.   Cardiovascular:  Negative for chest pain.  Gastrointestinal:  Positive for abdominal pain, nausea and vomiting.  Genitourinary:  Negative for dysuria, vaginal bleeding and vaginal discharge.  Musculoskeletal:  Positive for myalgias.  All other systems reviewed and are negative.   Physical Exam Updated Vital Signs BP (!) 128/90 (BP Location: Left Arm)   Pulse (!) 116   Temp 98.2 F (36.8 C)   Resp 20   Ht 5\' 7"  (1.702 m)   Wt 87.5 kg   LMP 08/31/2022 (Exact Date) Comment: [redacted] weeks pregnant  SpO2 95%   BMI 30.23 kg/m  Physical Exam Vitals and nursing note reviewed.  Constitutional:      General: She is not in acute distress.    Appearance: Normal appearance. She is not ill-appearing, toxic-appearing or diaphoretic.  HENT:     Head: Normocephalic and atraumatic.     Nose: Nose normal.     Mouth/Throat:  Mouth: Mucous membranes are moist.     Pharynx: Oropharynx is clear. Posterior oropharyngeal erythema present. No oropharyngeal exudate.     Comments: Uvula midline Eyes:     Extraocular Movements: Extraocular movements intact.     Conjunctiva/sclera: Conjunctivae normal.     Pupils: Pupils are equal, round, and reactive to light.  Cardiovascular:     Rate and Rhythm: Normal rate and regular rhythm.  Pulmonary:     Effort: Pulmonary effort is normal.     Breath sounds: Normal breath sounds. No wheezing.  Abdominal:     General: Abdomen is flat. Bowel sounds are  normal.     Palpations: Abdomen is soft.     Tenderness: There is abdominal tenderness.  Musculoskeletal:     Cervical back: Normal range of motion and neck supple. No tenderness.  Skin:    General: Skin is warm and dry.     Capillary Refill: Capillary refill takes less than 2 seconds.  Neurological:     Mental Status: She is alert and oriented to person, place, and time.     ED Results / Procedures / Treatments   Labs (all labs ordered are listed, but only abnormal results are displayed) Labs Reviewed  RESP PANEL BY RT-PCR (RSV, FLU A&B, COVID)  RVPGX2 - Abnormal; Notable for the following components:      Result Value   SARS Coronavirus 2 by RT PCR POSITIVE (*)    All other components within normal limits  COMPREHENSIVE METABOLIC PANEL - Abnormal; Notable for the following components:   Sodium 133 (*)    CO2 19 (*)    BUN 5 (*)    Creatinine, Ser 0.41 (*)    Calcium 8.7 (*)    Albumin 3.1 (*)    All other components within normal limits  GROUP A STREP BY PCR  CBC WITH DIFFERENTIAL/PLATELET  LIPASE, BLOOD  URINALYSIS, ROUTINE W REFLEX MICROSCOPIC    EKG None  Radiology US OB Limited > 14 wks  Result Date: 02/07/2023 CLINICAL DATA:  Crampy lower abdominal pain. Twenty-two weeks pregnant EXAM: LIMITED OBSTETRIC ULTRASOUND COMPARISON:  None Available. FINDINGS: Number of Fetuses: 1 Heart Rate:  171 bpm Movement: Yes Presentation: Cephalic Placental Location: Anterior Previa: No Amniotic Fluid (Subjective):  Within normal limits. Single pocket of 4.5 cm. FL: 4.3 cm 23 w  6 d MATERNAL FINDINGS: Cervix:  Obscured Uterus/Adnexae: No abnormality visualized. IMPRESSION: Intrauterine pregnancy of 23 weeks and 6 days by femur length. Positive fetal heart motion. The surface was obscured. Grossly closed. Of note the urinary bladder is contracted limiting sonographic window. This exam is performed on an emergent basis and does not comprehensively evaluate fetal size, dating, or anatomy;  follow-up complete OB US should be considered if further fetal assessment is warranted. Electronically Signed   By: Karen Kays M.D.   On: 02/07/2023 18:52    Procedures Procedures   Medications Ordered in ED Medications  sodium chloride 0.9 % bolus 500 mL (0 mLs Intravenous Stopped 02/07/23 1947)    ED Course/ Medical Decision Making/ A&P    Medical Decision Making Amount and/or Complexity of Data Reviewed Labs: ordered. Radiology: ordered.   27 year old female presents to ED for evaluation.  Please see HPI for further details.  On examination patient is afebrile, tachycardic.  Patient lung sounds are clear bilaterally, she is nonhypoxic.  Abdomen is soft and compressible throughout, appears gravid, nonfocal tenderness.  No CVA tenderness bilaterally.  Overall nontoxic in appearance.  CBC shows no leukocytosis,  no anemia.  Metabolic panel shows sodium 133, no elevated LFTs, no elevated bilirubin.  Patient provided 500 mL bag of fluid.  Patient lipase WNL.  Patient viral panel positive for COVID-19.  Patient ultrasound imaging is reassuring, fetal heart tones appreciated.  At this time this likely cause of patient symptoms secondary COVID-19.  Patient ultrasound imaging here is reassuring.  Patient pulse rate normalized after 500 bag of fluid.  The patient will be discharged at this time, she will follow-up with the MAU for further management and care.  She was given return precautions and she voiced understanding.  Had all her questions answered to her satisfaction.  Stable to discharge at this time.   Final Clinical Impression(s) / ED Diagnoses Final diagnoses:  COVID-19  Abdominal pain, unspecified abdominal location    Rx / DC Orders ED Discharge Orders     None         Clent Ridges 02/07/23 2009    Glyn Ade, MD 02/07/23 2311

## 2023-02-08 ENCOUNTER — Ambulatory Visit: Payer: Commercial Managed Care - HMO | Admitting: Cardiology

## 2023-02-12 ENCOUNTER — Encounter: Payer: Self-pay | Admitting: General Practice

## 2023-02-14 ENCOUNTER — Ambulatory Visit: Payer: Commercial Managed Care - HMO | Admitting: Certified Nurse Midwife

## 2023-02-14 ENCOUNTER — Other Ambulatory Visit: Payer: Self-pay

## 2023-02-14 VITALS — BP 117/72 | HR 97 | Wt 194.0 lb

## 2023-02-14 DIAGNOSIS — Z3A23 23 weeks gestation of pregnancy: Secondary | ICD-10-CM

## 2023-02-14 DIAGNOSIS — Z3492 Encounter for supervision of normal pregnancy, unspecified, second trimester: Secondary | ICD-10-CM

## 2023-02-14 NOTE — Progress Notes (Unsigned)
   PRENATAL VISIT NOTE  Subjective:  Natasha Beard is a 27 y.o. G1P0000 at [redacted]w[redacted]d being seen today for ongoing prenatal care.  She is currently monitored for the following issues for this {Blank single:19197::"high-risk","low-risk"} pregnancy and has Supervision of low-risk pregnancy and Palpitations on their problem list.  Patient reports {sx:14538}.   .  .   . Denies leaking of fluid.   The following portions of the patient's history were reviewed and updated as appropriate: allergies, current medications, past family history, past medical history, past social history, past surgical history and problem list.   Objective:  There were no vitals filed for this visit.  Fetal Status:           General:  Alert, oriented and cooperative. Patient is in no acute distress.  Skin: Skin is warm and dry. No rash noted.   Cardiovascular: Normal heart rate noted  Respiratory: Normal respiratory effort, no problems with respiration noted  Abdomen: Soft, gravid, appropriate for gestational age.        Pelvic: {Blank single:19197::"Cervical exam performed in the presence of a chaperone","Cervical exam deferred"}        Extremities: Normal range of motion.     Mental Status: Normal mood and affect. Normal behavior. Normal judgment and thought content.   Assessment and Plan:  Pregnancy: G1P0000 at [redacted]w[redacted]d 1. Encounter for supervision of low-risk pregnancy in second trimester ***  2. [redacted] weeks gestation of pregnancy ***  {Blank single:19197::"Term","Preterm"} labor symptoms and general obstetric precautions including but not limited to vaginal bleeding, contractions, leaking of fluid and fetal movement were reviewed in detail with the patient. Please refer to After Visit Summary for other counseling recommendations.   No follow-ups on file.  Future Appointments  Date Time Provider Department Center  02/14/2023 11:15 AM [facility] [place] [facility]  04/12/2023  9:40 AM Tobb, Lavona [place], DO [facility]  None    Bernerd Limbo, CNM

## 2023-02-17 ENCOUNTER — Inpatient Hospital Stay (HOSPITAL_COMMUNITY)
Admission: AD | Admit: 2023-02-17 | Discharge: 2023-02-17 | Disposition: A | Discharge status: Home / Self Care | Payer: Commercial Managed Care - HMO | Source: Home / Self Care | Attending: Obstetrics and Gynecology | Admitting: Obstetrics and Gynecology

## 2023-02-17 ENCOUNTER — Encounter (HOSPITAL_COMMUNITY): Payer: Self-pay | Admitting: Obstetrics and Gynecology

## 2023-02-17 DIAGNOSIS — R519 Headache, unspecified: Secondary | ICD-10-CM

## 2023-02-17 DIAGNOSIS — O26892 Other specified pregnancy related conditions, second trimester: Secondary | ICD-10-CM

## 2023-02-17 DIAGNOSIS — O99352 Diseases of the nervous system complicating pregnancy, second trimester: Secondary | ICD-10-CM | POA: Diagnosis present

## 2023-02-17 DIAGNOSIS — Z3A24 24 weeks gestation of pregnancy: Secondary | ICD-10-CM | POA: Diagnosis not present

## 2023-02-17 DIAGNOSIS — G43909 Migraine, unspecified, not intractable, without status migrainosus: Secondary | ICD-10-CM | POA: Insufficient documentation

## 2023-02-17 LAB — URINALYSIS, ROUTINE W REFLEX MICROSCOPIC
Bilirubin Urine: NEGATIVE
Glucose, UA: NEGATIVE mg/dL
Hgb urine dipstick: NEGATIVE
Ketones, ur: 80 mg/dL — AB
Nitrite: NEGATIVE
Protein, ur: NEGATIVE mg/dL
Specific Gravity, Urine: 1.02 (ref 1.005–1.030)
pH: 6 (ref 5.0–8.0)

## 2023-02-17 MED ORDER — METOCLOPRAMIDE HCL 5 MG/ML IJ SOLN
10.0000 mg | Freq: Once | INTRAMUSCULAR | Status: AC
  Administered 2023-02-17: 10 mg via INTRAVENOUS
  Filled 2023-02-17: qty 2

## 2023-02-17 MED ORDER — LACTATED RINGERS IV BOLUS
1000.0000 mL | Freq: Once | INTRAVENOUS | Status: AC
  Administered 2023-02-17: 1000 mL via INTRAVENOUS

## 2023-02-17 MED ORDER — DIPHENHYDRAMINE HCL 50 MG/ML IJ SOLN
25.0000 mg | Freq: Once | INTRAMUSCULAR | Status: AC
  Administered 2023-02-17: 25 mg via INTRAVENOUS
  Filled 2023-02-17: qty 1

## 2023-02-17 NOTE — MAU Provider Note (Signed)
History     CSN: [id number]  Arrival date and time: 02/17/23 0440   Event Date/Time   First Provider Initiated Contact with Patient 02/17/23 0545      Chief Complaint  Patient presents with   Headache   Natasha Beard is a 27 y.o. G1P0 at [redacted]w[redacted]d who receives care at [facility].  She presents today for headache.  She reports having HA throughout the day and taking tylenol around 6 or 7 pm without relief.  She describes the pain as "heavy"that radiates around her left side. She also reports some throbbing.  She rates the pain a 10/10.  She states the pain is worse with light exposure and "a little bit of everything."  She denies relieving factors.   Patient endorses fetal movement. No vaginal concerns or abdominal cramping.  She reports some mild tenderness on her left side that started upon arrival.   OB History     Gravida  1   Para  0   Term  0   Preterm  0   AB  0   Living  0      SAB  0   IAB  0   Ectopic  0   Multiple  0   Live Births  0           Past Medical History:  Diagnosis Date   Anxiety    Depression     Past Surgical History:  Procedure Laterality Date   NO PAST SURGERIES      Family History  Problem Relation Age of Onset   Healthy Mother    Diabetes Father    Seizures Father    Breast cancer Maternal Grandmother    Breast cancer Maternal Aunt     Social History   Tobacco Use   Smoking status: Never   Smokeless tobacco: Never  Vaping Use   Vaping status: Never Used  Substance Use Topics   Alcohol use: Not Currently   Drug use: Not Currently    Types: Marijuana    Comment: +UDS 12/11/22 THC    Allergies:  Allergies  Allergen Reactions   Apple Juice    Chocolate     In moderation   Erythromycin Swelling    Medications Prior to Admission  Medication Sig Dispense Refill Last Dose   aspirin EC 81 MG tablet Take 1 tablet (81 mg total) by mouth daily. Swallow whole. 30 tablet 12 02/16/2023   famotidine (PEPCID) 20 MG tablet  Take 1 tablet (20 mg total) by mouth 2 (two) times daily. 60 tablet 3 02/16/2023   ondansetron (ZOFRAN-ODT) 4 MG disintegrating tablet Take 1 tablet (4 mg total) by mouth every 8 (eight) hours as needed for nausea or vomiting. 30 tablet 1 02/16/2023   Prenatal Vit-Fe Fumarate-FA (PRENATAL MULTIVITAMIN) TABS tablet Take 1 tablet by mouth daily at 12 noon.   02/16/2023   albuterol (PROVENTIL HFA;VENTOLIN HFA) 108 (90 Base) MCG/ACT inhaler Inhale 1-2 puffs into the lungs every 6 (six) hours as needed for wheezing or shortness of breath. 1 Inhaler 0 More than a month   Spacer/Aero-Holding Chambers (AEROCHAMBER PLUS) inhaler Use as instructed 1 each 2 More than a month    Review of Systems  Eyes:  Negative for visual disturbance.  Gastrointestinal:  Positive for nausea and vomiting. Negative for constipation and diarrhea.  Genitourinary:  Negative for difficulty urinating, dysuria, vaginal bleeding and vaginal discharge.  Neurological:  Positive for light-headedness and headaches. Negative for dizziness.   Physical Exam  Blood pressure 133/63, pulse 83, temperature (!) 97.3 F (36.3 C), temperature source Oral, resp. rate 18, height 5\' 7"  (1.702 m), weight 87.1 kg, last menstrual period 08/31/2022, SpO2 100%.  Physical Exam Vitals reviewed.  Constitutional:      General: She is in acute distress.     Appearance: She is well-developed.  HENT:     Head: Normocephalic and atraumatic.  Cardiovascular:     Rate and Rhythm: Normal rate.  Pulmonary:     Effort: Pulmonary effort is normal.  Abdominal:     Palpations: Abdomen is soft.     Tenderness: There is no abdominal tenderness.     Comments: Gravid, Appears AGA  Musculoskeletal:        General: Normal range of motion.     Cervical back: Normal range of motion.  Skin:    General: Skin is warm and dry.  Neurological:     Mental Status: She is alert and oriented to person, place, and time.  Psychiatric:        Mood and Affect: Mood  normal.        Behavior: Behavior normal.     Fetal Assessment 155 bpm, Mod Var, -Decels, +Accels Toco: No ctx graphed  MAU Course   Results for orders placed or performed during the hospital encounter of 02/17/23 (from the past 24 hour(s))  Urinalysis, Routine w reflex microscopic -Urine, Clean Catch     Status: Abnormal   Collection Time: 02/17/23  6:13 AM  Result Value Ref Range   Color, Urine YELLOW YELLOW   APPearance HAZY (A) CLEAR   Specific Gravity, Urine 1.020 1.005 - 1.030   pH 6.0 5.0 - 8.0   Glucose, UA NEGATIVE NEGATIVE mg/dL   Hgb urine dipstick NEGATIVE NEGATIVE   Bilirubin Urine NEGATIVE NEGATIVE   Ketones, ur 80 (A) NEGATIVE mg/dL   Protein, ur NEGATIVE NEGATIVE mg/dL   Nitrite NEGATIVE NEGATIVE   Leukocytes,Ua MODERATE (A) NEGATIVE   RBC / HPF 0-5 0 - 5 RBC/hpf   WBC, UA 6-10 0 - 5 WBC/hpf   Bacteria, UA FEW (A) NONE SEEN   Squamous Epithelial / HPF 11-20 0 - 5 /HPF   Mucus PRESENT    No results found.  MDM PE Labs: UA EFM Start IV LR Bolus Antiemetic Antihistamine  Assessment and Plan  27 year old G1P0  SIUP at 24.2 weeks Cat I FT Migraine  -POC Reviewed -Exam findings discussed. -Patient reassured that nose bleeds are common complaint during pregnancy. Reviewed usage of nasal saline.  -Start IV and give fluids. -Benadryl and Reglan IV per protocol. -Will monitor and reassess. - Cherre Robins MSN, CNM 02/17/2023, 5:46 AM   Reassessment (7:05 AM) -Patient reports resolution of HA. -Encouraged rest and hydration at home. -UA sent for culture -Encouraged to call primary office or return to MAU if symptoms worsen or with the onset of new symptoms. -Discharged to home in improved condition.  Cherre Robins MSN, CNM Advanced Practice Provider, Center for [employer]

## 2023-02-17 NOTE — MAU Note (Signed)
..  Natasha Beard is a 27 y.o. at [redacted]w[redacted]d here in MAU reporting: migraine that began around 5 pm reports at 7pm she took tylenol, drank water, and tried to sleep but none of it helped. Reports that the pain has made her nauseous and she has vomited twice.   Pain score: 10/10  [id number] in room 160 Lab orders placed from triage:  UA

## 2023-02-18 LAB — CULTURE, OB URINE

## 2023-02-21 ENCOUNTER — Encounter: Payer: Self-pay | Admitting: Certified Nurse Midwife

## 2023-02-25 ENCOUNTER — Encounter: Payer: Self-pay | Admitting: Certified Nurse Midwife

## 2023-02-28 ENCOUNTER — Telehealth: Payer: Self-pay | Admitting: Cardiology

## 2023-02-28 NOTE — Telephone Encounter (Signed)
I returned call to patient. Can you call to schedule virtual visit

## 2023-02-28 NOTE — Telephone Encounter (Signed)
Patient is returning call in regards to results. Requesting call around 3:15 P.M. today.

## 2023-03-06 ENCOUNTER — Other Ambulatory Visit: Payer: Self-pay

## 2023-03-06 DIAGNOSIS — Z3A24 24 weeks gestation of pregnancy: Secondary | ICD-10-CM

## 2023-03-06 DIAGNOSIS — Z3492 Encounter for supervision of normal pregnancy, unspecified, second trimester: Secondary | ICD-10-CM

## 2023-03-06 NOTE — Progress Notes (Unsigned)
   PRENATAL VISIT NOTE  Subjective:  Natasha Beard is a 27 y.o. G1P0000 at [redacted]w[redacted]d being seen today for ongoing prenatal care.  She is currently monitored for the following issues for this {Blank single:19197::"high-risk","low-risk"} pregnancy and has Supervision of low-risk pregnancy and Palpitations on their problem list.  Patient reports {sx:14538}.   .  .   . Denies leaking of fluid.   The following portions of the patient's history were reviewed and updated as appropriate: allergies, current medications, past family history, past medical history, past social history, past surgical history and problem list.   Objective:  There were no vitals filed for this visit.  Fetal Status:           General:  Alert, oriented and cooperative. Patient is in no acute distress.  Skin: Skin is warm and dry. No rash noted.   Cardiovascular: Normal heart rate noted  Respiratory: Normal respiratory effort, no problems with respiration noted  Abdomen: Soft, gravid, appropriate for gestational age.        Pelvic: {Blank single:19197::"Cervical exam performed in the presence of a chaperone","Cervical exam deferred"}        Extremities: Normal range of motion.     Mental Status: Normal mood and affect. Normal behavior. Normal judgment and thought content.   Assessment and Plan:  Pregnancy: G1P0000 at [redacted]w[redacted]d 1. Encounter for supervision of low-risk pregnancy in second trimester ***  2. [redacted] weeks gestation of pregnancy ***  {Blank single:19197::"Term","Preterm"} labor symptoms and general obstetric precautions including but not limited to vaginal bleeding, contractions, leaking of fluid and fetal movement were reviewed in detail with the patient. Please refer to After Visit Summary for other counseling recommendations.   No follow-ups on file.  Future Appointments  Date Time Provider Department Center  03/07/2023  8:20 AM [facility]  03/07/2023  9:35 AM Bernerd Limbo, CNM [facility]   03/21/2023 10:15 AM Bernerd Limbo, CNM [facility]  04/12/2023  9:40 AM Tobb, Lavona Mound, DO [facility] None    Bernerd Limbo, CNM

## 2023-03-07 ENCOUNTER — Other Ambulatory Visit: Payer: Commercial Managed Care - HMO

## 2023-03-07 ENCOUNTER — Other Ambulatory Visit: Payer: Self-pay

## 2023-03-07 ENCOUNTER — Ambulatory Visit (INDEPENDENT_AMBULATORY_CARE_PROVIDER_SITE_OTHER): Payer: Commercial Managed Care - HMO | Admitting: Certified Nurse Midwife

## 2023-03-07 VITALS — BP 130/73 | HR 94 | Wt 191.0 lb

## 2023-03-07 DIAGNOSIS — Z23 Encounter for immunization: Secondary | ICD-10-CM

## 2023-03-07 DIAGNOSIS — Z3492 Encounter for supervision of normal pregnancy, unspecified, second trimester: Secondary | ICD-10-CM

## 2023-03-07 DIAGNOSIS — Z3A26 26 weeks gestation of pregnancy: Secondary | ICD-10-CM

## 2023-03-07 NOTE — Telephone Encounter (Signed)
Spoke with pt. Appt made for 10/2

## 2023-03-08 LAB — GLUCOSE TOLERANCE, 2 HOURS W/ 1HR
Glucose, 1 hour: 143 mg/dL (ref 70–179)
Glucose, 2 hour: 105 mg/dL (ref 70–152)
Glucose, Fasting: 81 mg/dL (ref 70–91)

## 2023-03-08 LAB — RPR: RPR Ser Ql: NONREACTIVE

## 2023-03-08 LAB — CBC
Hematocrit: 41.2 % (ref 34.0–46.6)
Hemoglobin: 13.3 g/dL (ref 11.1–15.9)
MCH: 31.8 pg (ref 26.6–33.0)
MCHC: 32.3 g/dL (ref 31.5–35.7)
MCV: 99 fL — ABNORMAL HIGH (ref 79–97)
Platelets: 155 10*3/uL (ref 150–450)
RBC: 4.18 x10E6/uL (ref 3.77–5.28)
RDW: 12.6 % (ref 11.7–15.4)
WBC: 8.1 10*3/uL (ref 3.4–10.8)

## 2023-03-08 LAB — HIV ANTIBODY (ROUTINE TESTING W REFLEX): HIV Screen 4th Generation wRfx: NONREACTIVE

## 2023-03-14 ENCOUNTER — Encounter: Payer: Self-pay | Admitting: Family Medicine

## 2023-03-14 IMAGING — CT CT RENAL STONE PROTOCOL
2 of 4 series · 17 of 46 positions shown, 19 images · non-contrast
Comparison: None.

CLINICAL DATA: [age] year female with flank pain. Concern for
kidney stone.

EXAM:
CT ABDOMEN AND PELVIS WITHOUT CONTRAST
TECHNIQUE: Multidetector CT imaging of the abdomen and pelvis was performed
following the standard protocol without IV contrast.

[Series 2: axial st · axial · 0.59mm/px · z∈[+1128,+1498]mm · 14 of 84 slices shown, 16 images]
[im 5/84  soft-tissue]
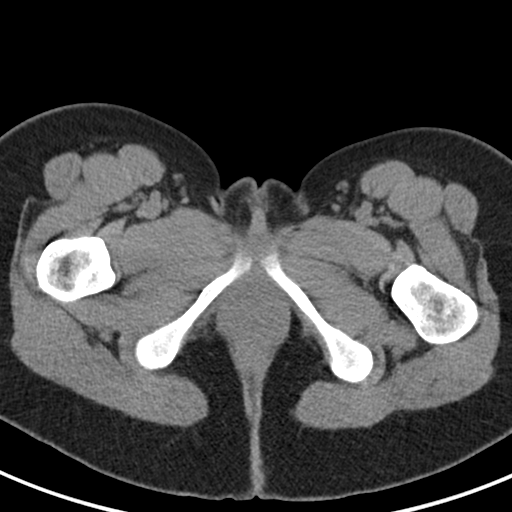
[im 5/84  bone]
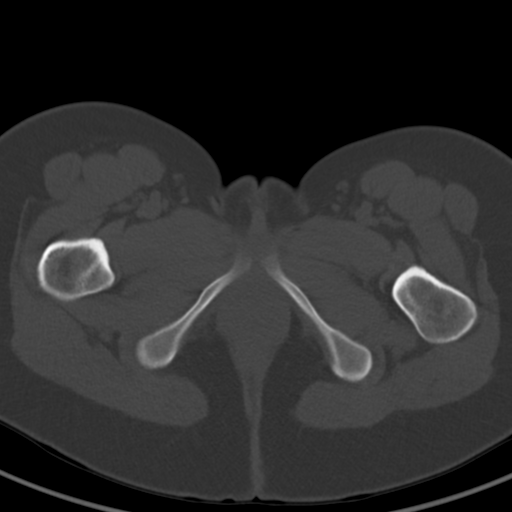
[im 10/84  soft-tissue]
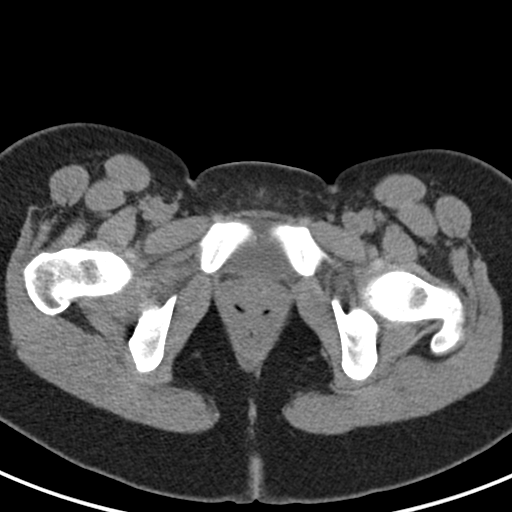
[im 19/84  soft-tissue]
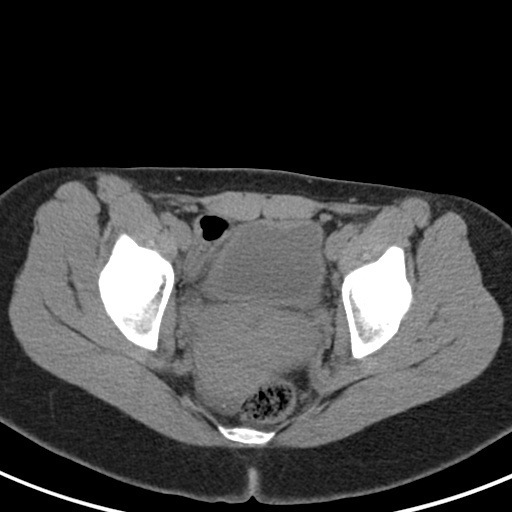
[im 24/84  soft-tissue]
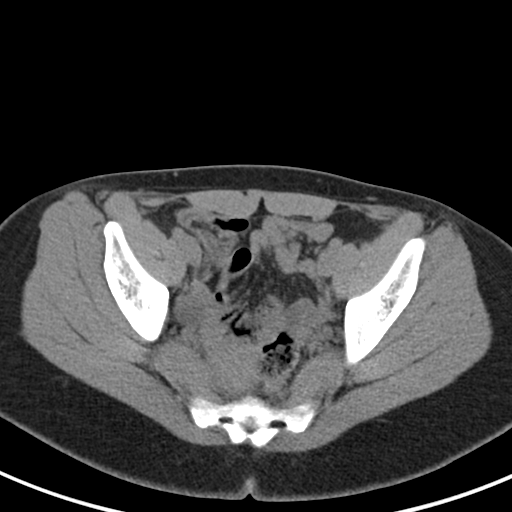
[im 28/84  soft-tissue]
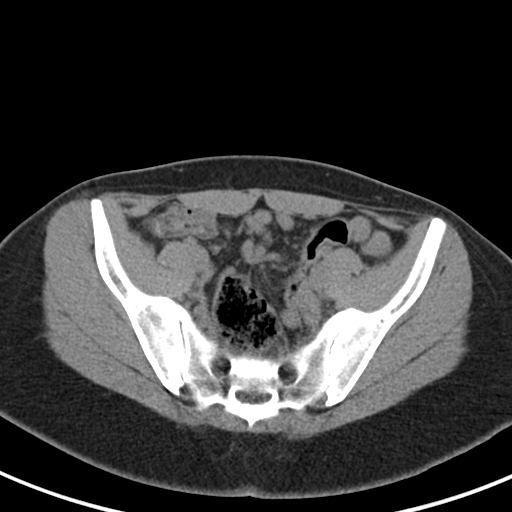
[im 33/84  soft-tissue]
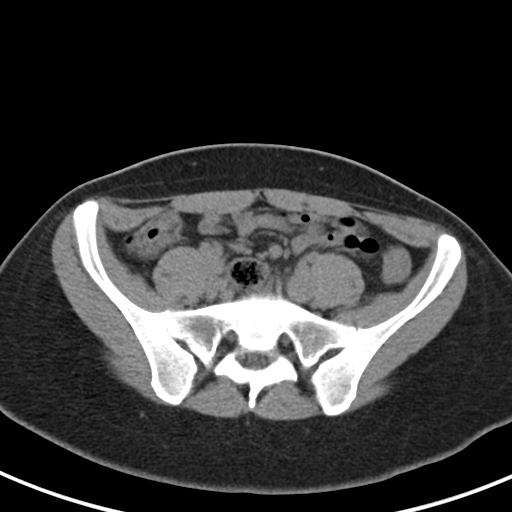
[im 37/84  soft-tissue]
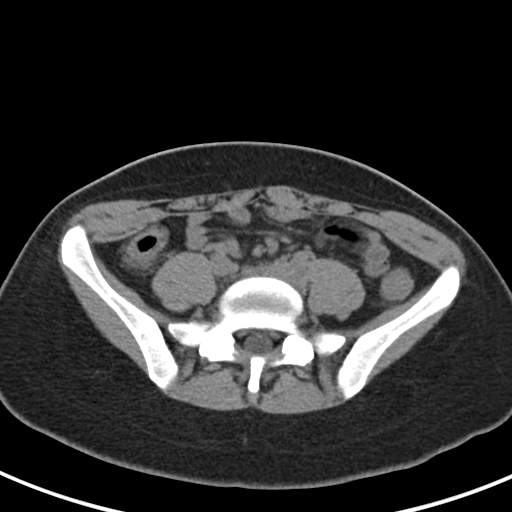
[im 47/84  soft-tissue]
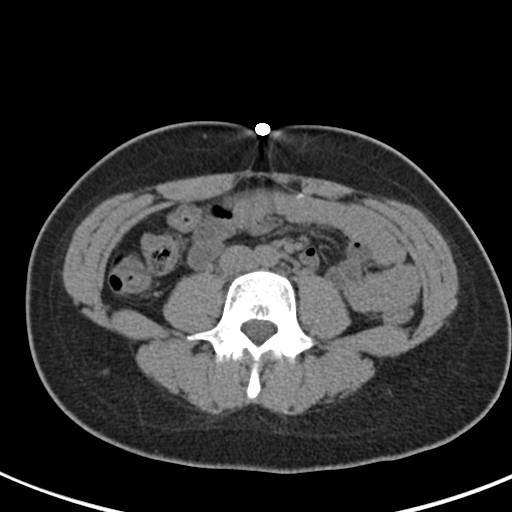
[im 51/84  soft-tissue]
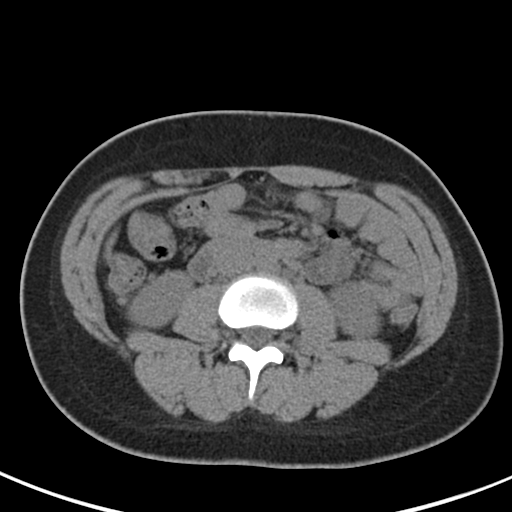
[im 51/84  bone]
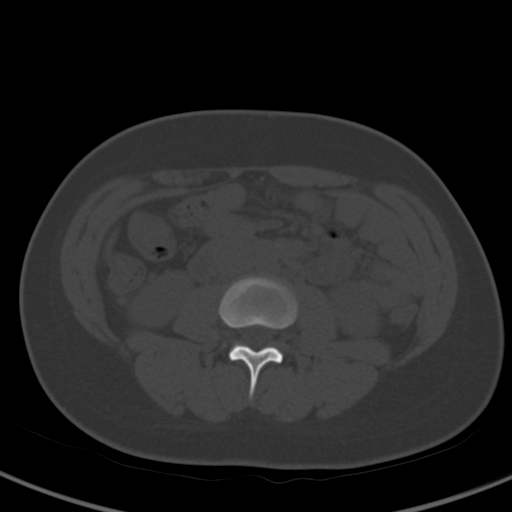
[im 56/84  soft-tissue]
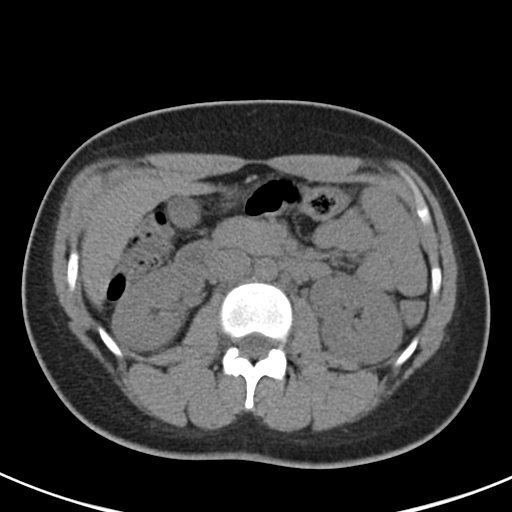
[im 60/84  soft-tissue]
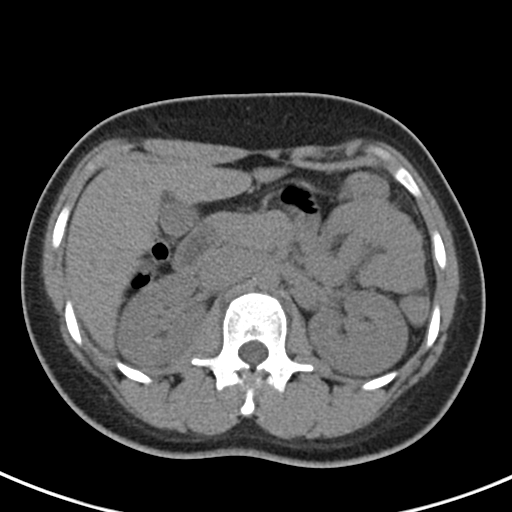
[im 65/84  soft-tissue]
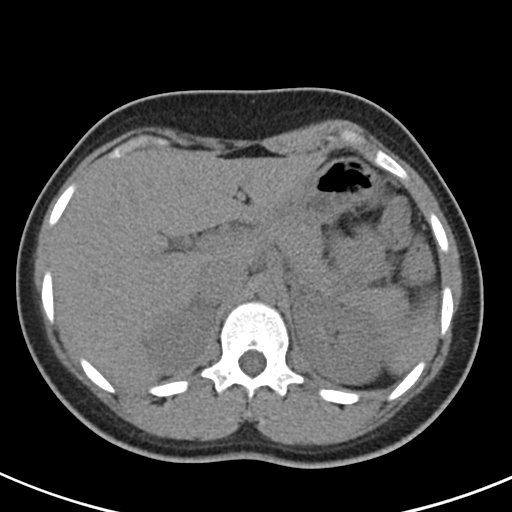
[im 74/84  soft-tissue]
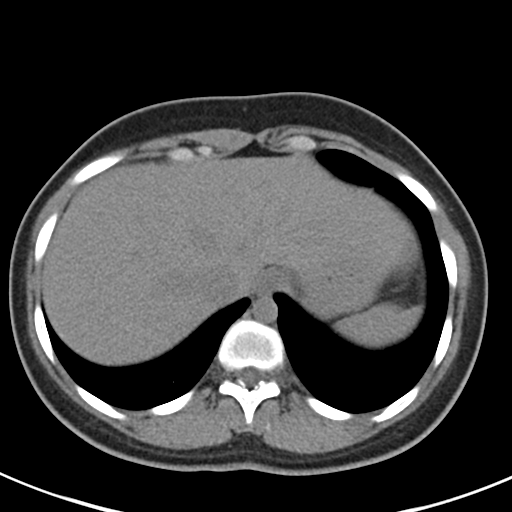
[im 79/84  soft-tissue]
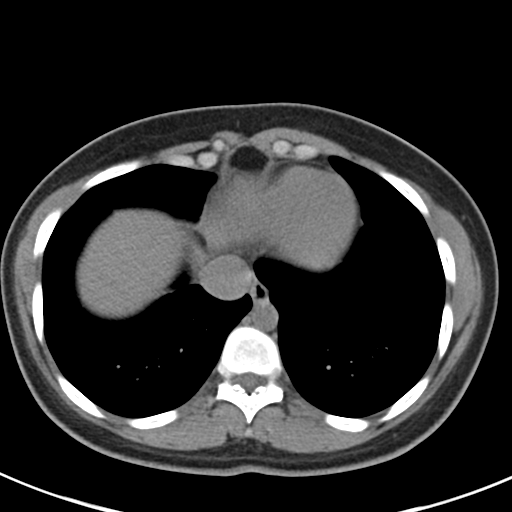

[Series 4: coronal · coronal · 0.79mm/px · 3 of 96 slices shown]
[im 32/96  soft-tissue]
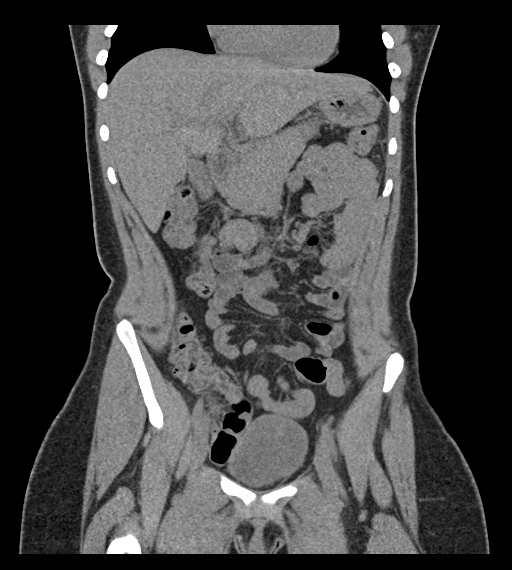
[im 43/96  soft-tissue]
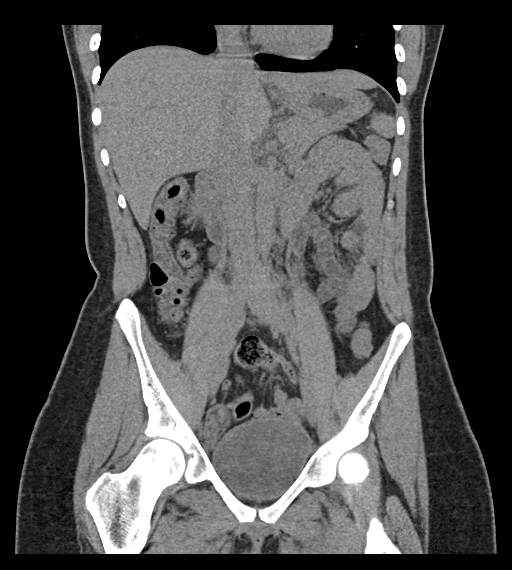
[im 53/96  soft-tissue]
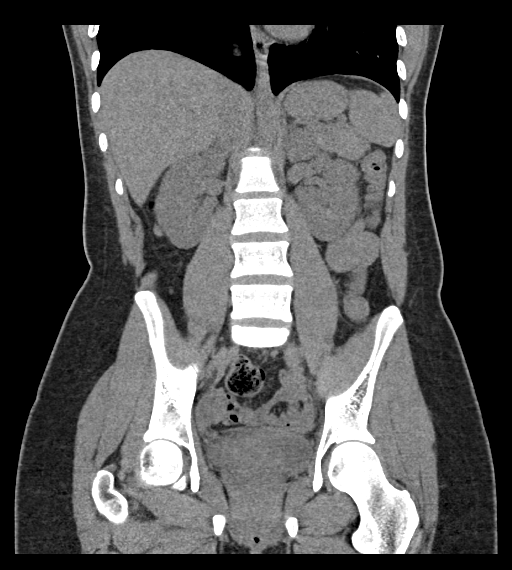

[17 of 46 positions shown; findings below may reference images not displayed]

FINDINGS: Evaluation of this exam is limited in the absence of intravenous
contrast.

Lower chest: The visualized lung bases are clear.

No intra-abdominal free air. Trace free fluid within the pelvis.

Hepatobiliary: No focal liver abnormality is seen. No gallstones,
gallbladder wall thickening, or biliary dilatation.

Pancreas: Unremarkable. No pancreatic ductal dilatation or
surrounding inflammatory changes.

Spleen: Normal in size without focal abnormality.

Adrenals/Urinary Tract: The adrenal glands, kidneys, visualized
ureters, and the urinary bladder appear unremarkable.

Stomach/Bowel: There is no bowel obstruction or active inflammation.
Mild thickened appearance of the colon, likely related to
underdistention. The appendix is normal.

Vascular/Lymphatic: The abdominal aorta and IVC are grossly
unremarkable on this noncontrast CT. No portal venous gas. There is
no adenopathy.

Reproductive: The uterus is retroflexed. No adnexal masses.

Other: None

Musculoskeletal: No acute or significant osseous findings.
IMPRESSION: No acute intra-abdominal or pelvic pathology. No hydronephrosis or
nephrolithiasis.

## 2023-03-14 NOTE — Progress Notes (Signed)
Opened in error. Maureen Ralphs RN on 03/14/23 at 1055

## 2023-03-20 ENCOUNTER — Encounter: Payer: Self-pay | Admitting: Certified Nurse Midwife

## 2023-03-21 ENCOUNTER — Ambulatory Visit: Payer: Managed Care, Other (non HMO) | Admitting: Certified Nurse Midwife

## 2023-03-21 ENCOUNTER — Encounter: Payer: Self-pay | Admitting: Cardiology

## 2023-03-21 ENCOUNTER — Ambulatory Visit: Payer: Managed Care, Other (non HMO) | Attending: Cardiology | Admitting: Cardiology

## 2023-03-21 ENCOUNTER — Telehealth: Payer: Self-pay

## 2023-03-21 VITALS — Ht 67.0 in | Wt 191.0 lb

## 2023-03-21 DIAGNOSIS — R002 Palpitations: Secondary | ICD-10-CM

## 2023-03-21 DIAGNOSIS — Z3A28 28 weeks gestation of pregnancy: Secondary | ICD-10-CM

## 2023-03-21 DIAGNOSIS — I4729 Other ventricular tachycardia: Secondary | ICD-10-CM

## 2023-03-21 DIAGNOSIS — Z3493 Encounter for supervision of normal pregnancy, unspecified, third trimester: Secondary | ICD-10-CM

## 2023-03-21 NOTE — Patient Instructions (Signed)
Medication Instructions:  Your physician recommends that you continue on your current medications as directed. Please refer to the Current Medication list given to you today.  *If you need a refill on your cardiac medications before your next appointment, please call your pharmacy*   Lab Work: None   Testing/Procedures: None   Follow-Up: At [facility], you and your health needs are our priority.  As part of our continuing mission to provide you with exceptional heart care, we have created designated Provider Care Teams.  These Care Teams include your primary Cardiologist (physician) and Advanced Practice Providers (APPs -  Physician Assistants and Nurse Practitioners) who all work together to provide you with the care you need, when you need it.  Your next appointment:   8 week(s)  Provider:   Berniece Salines, DO

## 2023-03-21 NOTE — Progress Notes (Addendum)
Cardio-Obstetrics Clinic  Follow Up Note   Date:  03/21/2023   ID:  Natasha Beard, DOB 23-Jul-1995, MRN [id number]  PCP:  Patient, No Pcp Per   [facility] Providers Cardiologist:  Thomasene Ripple, DO  Electrophysiologist:  None        Referring MD: No ref. provider found   Chief Complaint: " I am problem   She is at home. I am in office.  Virtual Visit via Video  Note . I connected with the patient today by a   video enabled telemedicine application and verified that I am speaking with the correct person using two identifiers.    History of Present Illness:    Natasha Beard is a 27 y.o. female [G1P0000] who returns for follow up at  28 weeks and 6 days pregnant, presents for follow-up after experiencing palpitations. She wore a heart monitor which showed episodes of NSVT . The patient reports that these episodes were associated with stress, but she is currently feeling fine. She denies any current shortness of breath. The patient is planning a water birth in the hospital and is concerned about the impact of her heart condition on this plan.    Prior CV Studies Reviewed: The following studies were reviewed today: Zio monitor   Past Medical History:  Diagnosis Date   Anxiety    Depression     Past Surgical History:  Procedure Laterality Date   NO PAST SURGERIES        OB History     Gravida  1   Para  0   Term  0   Preterm  0   AB  0   Living  0      SAB  0   IAB  0   Ectopic  0   Multiple  0   Live Births  0               Current Medications: Current Meds  Medication Sig   albuterol (PROVENTIL HFA;VENTOLIN HFA) 108 (90 Base) MCG/ACT inhaler Inhale 1-2 puffs into the lungs every 6 (six) hours as needed for wheezing or shortness of breath.   aspirin EC 81 MG tablet Take 1 tablet (81 mg total) by mouth daily. Swallow whole.   famotidine (PEPCID) 20 MG tablet Take 1 tablet (20 mg total) by mouth 2 (two) times daily.   ondansetron  (ZOFRAN-ODT) 4 MG disintegrating tablet Take 1 tablet (4 mg total) by mouth every 8 (eight) hours as needed for nausea or vomiting.   Prenatal Vit-Fe Fumarate-FA (PRENATAL MULTIVITAMIN) TABS tablet Take 1 tablet by mouth daily at 12 noon.   Spacer/Aero-Holding Chambers (AEROCHAMBER PLUS) inhaler Use as instructed     Allergies:   Apple juice, Chocolate, and Erythromycin   Social History   Socioeconomic History   Marital status: Single    Spouse name: Not on file   Number of children: Not on file   Years of education: Not on file   Highest education level: Not on file  Occupational History   Not on file  Tobacco Use   Smoking status: Never   Smokeless tobacco: Never  Vaping Use   Vaping status: Never Used  Substance and Sexual Activity   Alcohol use: Not Currently   Drug use: Not Currently    Types: Marijuana    Comment: +UDS 12/11/22 THC   Sexual activity: Yes  Other Topics Concern   Not on file  Social History Narrative   Not on file  Social Determinants of Health   Financial Resource Strain: Not on file  Food Insecurity: Food Insecurity Present (11/22/2022)   Hunger Vital Sign    Worried About Running Out of Food in the Last Year: Sometimes true    Ran Out of Food in the Last Year: Never true  Transportation Needs: No Transportation Needs (11/22/2022)   PRAPARE - Administrator, Civil Service (Medical): No    Lack of Transportation (Non-Medical): No  Physical Activity: Not on file  Stress: Not on file  Social Connections: Not on file      Family History  Problem Relation Age of Onset   Healthy Mother    Diabetes Father    Seizures Father    Breast cancer Maternal Grandmother    Breast cancer Maternal Aunt       ROS:   Please see the history of present illness.     All other systems reviewed and are negative.   Labs/EKG Reviewed:    EKG:   EKG was not  ordered today.    Recent Labs: 02/07/2023: ALT 25; BUN 5; Creatinine, Ser 0.41;  Potassium 3.7; Sodium 133 03/07/2023: Hemoglobin 13.3; Platelets 155   Recent Lipid Panel No results found for: "CHOL", "TRIG", "HDL", "CHOLHDL", "LDLCALC", "LDLDIRECT"  Physical Exam:    VS:  Ht 5\' 7"  (1.702 m)   Wt 191 lb (86.6 kg)   LMP 08/31/2022 (Exact Date) Comment: [redacted] weeks pregnant  BMI 29.91 kg/m     Wt Readings from Last 3 Encounters:  03/21/23 191 lb (86.6 kg)  03/07/23 191 lb (86.6 kg)  02/17/23 192 lb (87.1 kg)     Risk Assessment/Risk Calculators:     CARPREG II Risk Prediction Index Score:  1.  The patient's risk for a primary cardiac event is 5%.   Modified World Health Organization [facility]) Classification of Maternal CV Risk   Class I         ASSESSMENT & PLAN:    NSVT in pregnancy Patient experienced episodes of tachycardia currently asymptomatic with no palpitations or shortness of breath.     Patient is currently [redacted] weeks pregnant. -Monitor patient's symptoms throughout the remainder of the pregnancy. -Schedule follow-up appointment at [redacted] weeks gestation to reassess and provide reassurance to [facility] team. -If any changes in symptoms occur, reassess the plan for water birth.  Total time spent with the minutes.  Patient Instructions  Medication Instructions:  Your physician recommends that you continue on your current medications as directed. Please refer to the Current Medication list given to you today.  *If you need a refill on your cardiac medications before your next appointment, please call your pharmacy*   Lab Work: None  Testing/Procedures: None   Follow-Up: At [facility], you and your health needs are our priority.  As part of our continuing mission to provide you with exceptional heart care, we have created designated Provider Care Teams.  These Care Teams include your primary Cardiologist (physician) and Advanced Practice Providers (APPs -  Physician Assistants and Nurse Practitioners) who all work together to provide you with the  care you need, when you need it.   Your next appointment:   8 week(s)  Provider:   Thomasene Ripple, DO      Dispo:  No follow-ups on file.   Medication Adjustments/Labs and Tests Ordered: Current medicines are reviewed at length with the patient today.  Concerns regarding medicines are outlined above.  Tests Ordered: No orders of the defined  types were placed in this encounter.  Medication Changes: No orders of the defined types were placed in this encounter.

## 2023-03-21 NOTE — Telephone Encounter (Signed)
  Patient Consent for Virtual Visit        [name] has provided verbal consent on 03/21/2023 for a virtual visit (video or telephone).   CONSENT FOR VIRTUAL VISIT FOR:  Natasha Beard  By participating in this virtual visit I agree to the following:  I hereby voluntarily request, consent and authorize [facility] and its employed or contracted physicians, physician assistants, nurse practitioners or other licensed health care professionals (the Practitioner), to provide me with telemedicine health care services (the "Services") as deemed necessary by the treating Practitioner. I acknowledge and consent to receive the Services by the Practitioner via telemedicine. I understand that the telemedicine visit will involve communicating with the Practitioner through live audiovisual communication technology and the disclosure of certain medical information by electronic transmission. I acknowledge that I have been given the opportunity to request an in-person assessment or other available alternative prior to the telemedicine visit and am voluntarily participating in the telemedicine visit.  I understand that I have the right to withhold or withdraw my consent to the use of telemedicine in the course of my care at any time, without affecting my right to future care or treatment, and that the Practitioner or I may terminate the telemedicine visit at any time. I understand that I have the right to inspect all information obtained and/or recorded in the course of the telemedicine visit and may receive copies of available information for a reasonable fee.  I understand that some of the potential risks of receiving the Services via telemedicine include:  Delay or interruption in medical evaluation due to technological equipment failure or disruption; Information transmitted may not be sufficient (e.g. poor resolution of images) to allow for appropriate medical decision making by the Practitioner; and/or   In rare instances, security protocols could fail, causing a breach of personal health information.  Furthermore, I acknowledge that it is my responsibility to provide information about my medical history, conditions and care that is complete and accurate to the best of my ability. I acknowledge that Practitioner's advice, recommendations, and/or decision may be based on factors not within their control, such as incomplete or inaccurate data provided by me or distortions of diagnostic images or specimens that may result from electronic transmissions. I understand that the practice of medicine is not an exact science and that Practitioner makes no warranties or guarantees regarding treatment outcomes. I acknowledge that a copy of this consent can be made available to me via my patient portal [facility] MyChart), or I can request a printed copy by calling the office of [facility].    I understand that my insurance will be billed for this visit.   I have read or had this consent read to me. I understand the contents of this consent, which adequately explains the benefits and risks of the Services being provided via telemedicine.  I have been provided ample opportunity to ask questions regarding this consent and the Services and have had my questions answered to my satisfaction. I give my informed consent for the services to be provided through the use of telemedicine in my medical care

## 2023-04-03 NOTE — Progress Notes (Unsigned)
   PRENATAL VISIT NOTE  Subjective:  Natasha Beard is a 27 y.o. G1P0000 at [redacted]w[redacted]d being seen today for ongoing prenatal care.  She is currently monitored for the following issues for this {Blank single:19197::"high-risk","low-risk"} pregnancy and has Supervision of low-risk pregnancy and Palpitations on their problem list.  Patient reports {sx:14538}.   .  .   . Denies leaking of fluid.   The following portions of the patient's history were reviewed and updated as appropriate: allergies, current medications, past family history, past medical history, past social history, past surgical history and problem list.   Objective:  There were no vitals filed for this visit.  Fetal Status:           General:  Alert, oriented and cooperative. Patient is in no acute distress.  Skin: Skin is warm and dry. No rash noted.   Cardiovascular: Normal heart rate noted  Respiratory: Normal respiratory effort, no problems with respiration noted  Abdomen: Soft, gravid, appropriate for gestational age.        Pelvic: {Blank single:19197::"Cervical exam performed in the presence of a chaperone","Cervical exam deferred"}        Extremities: Normal range of motion.     Mental Status: Normal mood and affect. Normal behavior. Normal judgment and thought content.   Assessment and Plan:  Pregnancy: G1P0000 at [redacted]w[redacted]d 1. Encounter for supervision of low-risk pregnancy in third trimester ***  2. [redacted] weeks gestation of pregnancy ***  {Blank single:19197::"Term","Preterm"} labor symptoms and general obstetric precautions including but not limited to vaginal bleeding, contractions, leaking of fluid and fetal movement were reviewed in detail with the patient. Please refer to After Visit Summary for other counseling recommendations.   No follow-ups on file.  Future Appointments  Date Time Provider Department Center  04/04/2023  9:15 AM [place]  04/18/2023 10:55 AM Bernerd Limbo, CNM  [facility]  05/09/2023  9:00 AM Tobb, Lavona Mound, DO [facility] None    Bernerd Limbo, CNM

## 2023-04-04 ENCOUNTER — Ambulatory Visit (INDEPENDENT_AMBULATORY_CARE_PROVIDER_SITE_OTHER): Payer: Managed Care, Other (non HMO) | Admitting: Certified Nurse Midwife

## 2023-04-04 VITALS — BP 131/79 | HR 91 | Wt 194.1 lb

## 2023-04-04 DIAGNOSIS — Z3493 Encounter for supervision of normal pregnancy, unspecified, third trimester: Secondary | ICD-10-CM

## 2023-04-04 DIAGNOSIS — Z23 Encounter for immunization: Secondary | ICD-10-CM | POA: Diagnosis not present

## 2023-04-04 DIAGNOSIS — Z3A30 30 weeks gestation of pregnancy: Secondary | ICD-10-CM

## 2023-04-11 NOTE — Progress Notes (Signed)
Pt did not come to appt

## 2023-04-12 ENCOUNTER — Ambulatory Visit: Payer: Commercial Managed Care - HMO | Admitting: Cardiology

## 2023-04-18 ENCOUNTER — Other Ambulatory Visit: Payer: Self-pay

## 2023-04-18 ENCOUNTER — Ambulatory Visit: Payer: Managed Care, Other (non HMO) | Admitting: Certified Nurse Midwife

## 2023-04-18 VITALS — BP 129/72 | HR 87 | Wt 199.0 lb

## 2023-04-18 DIAGNOSIS — Z3A32 32 weeks gestation of pregnancy: Secondary | ICD-10-CM

## 2023-04-18 DIAGNOSIS — Z3493 Encounter for supervision of normal pregnancy, unspecified, third trimester: Secondary | ICD-10-CM

## 2023-04-18 NOTE — Patient Instructions (Signed)
[facility]  Tuesdays & Thursdays 9:30am-3:30pm [address], [place], [place] Located in the [employer] Building ([facility]) Room 110A  For more information: Call: [phone] or Email: [email or web address] To book a FREE appt: [email or web address]  Offering prenatal consults,  postpartum outpatient lactation consults,  pumping consults and  "Each One, Teach One" informal lactation education for support persons.    We highly recommend childbirth education to help you plan for labor and begin practicing coping skills (which will be needed with or without pain meds).  [place] Childbirth Education Options: Sign up by visiting [email or web address]  Childbirth ~ Self-Paced eClass (English and Spanish) This online class offers you the freedom to complete a childbirth education series in the comfort of your own home at your own pace.  Childbirth Class (In-Person 4-Week Series  or on Saturdays, Virtual 4-Week Series ~ [place]) This interactive in-person class series will help you and your partner prepare for your birth experience. Topics include: Labor & Birth, Comfort Measures, Breathing Techniques, Massage, Medical Interventions, Pain Management Options, Cesarean Birth, Postpartum Care, and Newborn Care  Comfort Techniques for Labor ~ In-Person Class [facility]) This interactive class is designed for parents-to-be who want to learn & practice hands-on skills to help relieve some of the discomfort of labor and encourage their babies to rotate toward the best position for birth. Moms and their partners will be able to try a variety of labor positions with birth balls and rebozos as well as practice breathing, relaxation, and visualization techniques.  Natural Childbirth Class (In-Person 5-Week Series, In-Person on Saturdays or Virtual 5-Week Series ~ [place]) This class series is designed for expectant parents who want to learn and practice natural  methods of coping with the process of labor and childbirth.  Waterbirth ~ Airline pilot Interested in a waterbirth? In addition to a consultation with your credentialed waterbirth provider, this free, informational online class will help you discover whether waterbirth is the right fit for you. Not all obstetrical practices offer waterbirth, so check with your healthcare provider.  Tour Probation officer) - [facility] [employer] our 4 minute video tour of [facility] located in [place].    Parenting Education Options:  Pregnancy 101 (Virtual) Congratulations on your pregnancy! This class is geared toward moms in their first trimester, but everyone is welcome. We are excited to guide you through all aspects of supporting a healthy pregnancy. You will learn what to expect at routine prenatal care appointments, common postpartum adjustments, basic infant safety, and breastfeeding.  Successful Partnering & Parenting ~ In-Person Workshop [facility]) This workshop inspires and equips partners of all economic levels, ages, and cultures to confidently care for their infants, support the birthing persons, and navigate their own transformations into new partners and parents. Learning activities are geared towards supporting partner, but moms are welcome to attend.  'Baby & Me' Parenting Group (Virtual on Wednesdays at 11am) Enjoy this time discussing newborn & infant parenting topics and family adjustment issues with other new parents in a relaxed environment. Each week brings a new speaker or baby-centered activity. This group offers support and connection to parents as they journey through the adjustments and struggles of that sometimes overwhelming first year after the birth of a child.  Baby Safety, CPR, & Choking Class ~ Virtual This life-saving information is meant to encourage parents as they learn important safety and prevention  tips as well as infant CPR and relief of choking.  Community-Based  Childbirth Education Options:  Childbirth With A Twist! Be informed of your options, get educated on birth, understand what your body is doing, learn how to cope, and have a lot of fun and laughs all while doing it either from the comfort of your couch OR in our cozy office and classroom space near the [place] airport. If you are taking a virtual class, then class is taught LIVE, so you can ask questions and receive answers in real-time from an experienced doula and childbirth educator.  This virtual childbirth education class will meet for five instruction times online.  Although we are based in [place], [place], this virtual class is open to anyone in the world. Please visit: [email or web address] for more information.  Books We Love: The Doula Guide to Childbirth by Harland German and Otila Back The First-Time Parent's Childbirth Handbook by Dr. Amie Critchley, CNM The Birth Partner by Truddie Crumble  Placenta encapsulation is the process of turning a fresh/frozen placenta into capsules for easier ingestion. Encapsulators pick up the placenta shortly after delivery and clean/dry it, turn it into a powder and encapsulate in capsules. Encapsulation is reported (but not clinically studied or FDA/MD approved) to improve iron stores and milk supply, ease postpartum recovery and prevent postpartum mood disorders. We recommend extreme caution if patients are GBS positive, and do not recommend if patients are diagnosed with chorioamnionitis during labor (which usually means we would like to send the placenta to pathology for testing).  Here is a list of local placenta encapsulators:  Ase9 Charma Igo [email or web address]  [email or web address]  Birthing [place], [place] Tiffany Slade [email or web address]  [facility] Doula Angelica Knight [phone] [email or web address]  Baby, [address] [phone] [email or web address]  Eboni Allen [email or web address]  Passionate Engineer, [email or web address]  [facility]riving Doula Emelia Loron [phone] [email or web address]  Total Maternal Support Judd Gaudier [phone] [email or web address]

## 2023-04-18 NOTE — Progress Notes (Signed)
   PRENATAL VISIT NOTE  Subjective:  Natasha Beard is a 27 y.o. G1P0000 at [redacted]w[redacted]d being seen today for ongoing prenatal care.  She is currently monitored for the following issues for this low-risk pregnancy and has Supervision of low-risk pregnancy and Palpitations on their problem list.  Patient reports no complaints.  Contractions: Irritability. Vag. Bleeding: None.  Movement: Present. Denies leaking of fluid.   The following portions of the patient's history were reviewed and updated as appropriate: allergies, current medications, past family history, past medical history, past social history, past surgical history and problem list.   Objective:   Vitals:   04/18/23 1118  BP: 129/72  Pulse: 87  Weight: 199 lb (90.3 kg)   Fetal Status: Fetal Heart Rate (bpm): 132 Fundal Height: 33 cm Movement: Present  Presentation: Vertex  General:  Alert, oriented and cooperative. Patient is in no acute distress.  Skin: Skin is warm and dry. No rash noted.   Cardiovascular: Normal heart rate noted  Respiratory: Normal respiratory effort, no problems with respiration noted  Abdomen: Soft, gravid, appropriate for gestational age.  Pain/Pressure: Present     Pelvic: Cervical exam deferred        Extremities: Normal range of motion.  Edema: None  Mental Status: Normal mood and affect. Normal behavior. Normal judgment and thought content.   Assessment and Plan:  Pregnancy: G1P0000 at [redacted]w[redacted]d 1. Encounter for supervision of low-risk pregnancy in third trimester - Doing well, feeling regular and vigorous fetal movement   2. [redacted] weeks gestation of pregnancy - Pt is interested in waterbirth.  No contraindications at this time per chart review/patient assessment.   - Pt has taken class, and will continue to see CNM for visits in the office.  - Discussed waterbirth as option for low-risk pregnancy.  Reviewed conditions that may arise during pregnancy that will risk pt out of waterbirth including hypertension,  diabetes, fetal growth restriction <10%ile, etc. - Encouraged to take natural childbirth class and given list of classes - Expressed interest in placenta encapsulation, given list.  Preterm labor symptoms and general obstetric precautions including but not limited to vaginal bleeding, contractions, leaking of fluid and fetal movement were reviewed in detail with the patient. Please refer to After Visit Summary for other counseling recommendations.   Return in about 2 weeks (around 05/02/2023) for IN-PERSON.  Future Appointments  Date Time Provider Department Center  05/02/2023  1:55 PM Osborne Oman [facility]  05/09/2023  9:00 AM Thomasene Ripple, DO [facility] None  05/16/2023  2:35 PM Bernerd Limbo, CNM [facility]   Bernerd Limbo, CNM

## 2023-04-23 ENCOUNTER — Inpatient Hospital Stay (HOSPITAL_COMMUNITY)
Admission: AD | Admit: 2023-04-23 | Discharge: 2023-04-24 | Disposition: A | Discharge status: Home / Self Care | Payer: Commercial Managed Care - HMO | Attending: Obstetrics and Gynecology | Admitting: Obstetrics and Gynecology

## 2023-04-23 ENCOUNTER — Other Ambulatory Visit: Payer: Self-pay

## 2023-04-23 ENCOUNTER — Encounter (HOSPITAL_COMMUNITY): Payer: Self-pay | Admitting: Obstetrics and Gynecology

## 2023-04-23 ENCOUNTER — Encounter: Payer: Self-pay | Admitting: Certified Nurse Midwife

## 2023-04-23 DIAGNOSIS — R102 Pelvic and perineal pain: Secondary | ICD-10-CM | POA: Diagnosis present

## 2023-04-23 DIAGNOSIS — O4703 False labor before 37 completed weeks of gestation, third trimester: Secondary | ICD-10-CM | POA: Insufficient documentation

## 2023-04-23 DIAGNOSIS — O26853 Spotting complicating pregnancy, third trimester: Secondary | ICD-10-CM | POA: Diagnosis present

## 2023-04-23 DIAGNOSIS — O479 False labor, unspecified: Secondary | ICD-10-CM

## 2023-04-23 DIAGNOSIS — Z3A33 33 weeks gestation of pregnancy: Secondary | ICD-10-CM | POA: Diagnosis not present

## 2023-04-23 LAB — URINALYSIS, ROUTINE W REFLEX MICROSCOPIC
Bacteria, UA: NONE SEEN
Bilirubin Urine: NEGATIVE
Glucose, UA: NEGATIVE mg/dL
Hgb urine dipstick: NEGATIVE
Ketones, ur: NEGATIVE mg/dL
Nitrite: NEGATIVE
Protein, ur: NEGATIVE mg/dL
Specific Gravity, Urine: 1.012 (ref 1.005–1.030)
pH: 6 (ref 5.0–8.0)

## 2023-04-23 LAB — POCT FERN TEST: POCT Fern Test: NEGATIVE

## 2023-04-23 NOTE — Telephone Encounter (Signed)
Called pt in response to MyChart message. Reports spotting that began this morning is now resolved. Reports abdominal cramping that began last night has continued intermittently throughout the day. Does feel stomach becoming tight/hard with cramping. There is no regular pattern to cramping and she doesn't feel these are happening consistently every 10 minutes. Most recent intercourse was this AM. Reports vaginal discharge has been clear throughout pregnancy and is now milky, white. No vaginal irritation or odor. Reports normal fetal movement. Has felt some pelvic pressure today that was fully resolved with application of maternity belt. Pt reports trying to eat and drink well throughout the day. Did take a warm bath this morning. Has not tried any other comfort measures.  Reviewed my concern is that she is having contractions, which could lead to preterm labor. Reviewed suggestions for abdominal cramping including Tylenol (1000 mg), drinking 16 oz of water, warm bath, laying on side for 1 hour. Patient plans to try these after ending phone call. If symptoms not resolved after trying comfort measures, patient agrees to present to MAU for evaluation. Also reviewed that if at any point cramping seems to be regular at 10 minutes apart or she feels decreased fetal movement she should go immediately to MAU. If symptoms resolve with comfort measure she will come into office tomorrow for vaginal swab to be sure there is no vaginal infection present.

## 2023-04-23 NOTE — MAU Note (Signed)
.  Natasha Beard is a 27 y.o. at [redacted]w[redacted]d here in MAU reporting: had spotting and cramping this morning. Spotting stopped, but cramping has not. Reports intercourse. States she was talking to provider through [facility] and they told her multiple things to try to see if ctx would stop - nothing has helped. Normal FM earlier today, but movement slowed down at 1930 - feeling movement currently in triage. Denies VB or LOF.   Onset of complaint: all day Pain score: 10 Vitals:   04/23/23 2216  BP: 115/62  Pulse: 90  Resp: 19  Temp: 98 F (36.7 C)  SpO2: 98%     FHT:147 Lab orders placed from triage:  UA

## 2023-04-23 NOTE — MAU Provider Note (Signed)
Chief Complaint:  Contractions and Decreased Fetal Movement  HPI  HPI: Natasha Beard is a 27 y.o. G1P0000 at 55w4dwho presents to maternity admissions reporting spotting and cramping since this morning. Spotting has stopped but cramping continues. Intercourse prior to start of symptoms. Baby moving now. Otherwise feeling well without complaints or concerns.  She reports good fetal movement, denies LOF, vaginal bleeding, vaginal itching/burning, urinary symptoms, h/a, dizziness, n/v, diarrhea, constipation or fever/chills.  She denies headache, visual changes or RUQ abdominal pain.   Past Medical History: Past Medical History:  Diagnosis Date   Anxiety    Depression     Past obstetric history: OB History  Gravida Para Term Preterm AB Living  1 0 0 0 0 0  SAB IAB Ectopic Multiple Live Births  0 0 0 0 0    # Outcome Date GA Lbr Len/2nd Weight Sex Type Anes PTL Lv  1 Current             Past Surgical History: Past Surgical History:  Procedure Laterality Date   NO PAST SURGERIES      Family History: Family History  Problem Relation Age of Onset   Healthy Mother    Diabetes Father    Seizures Father    Breast cancer Maternal Grandmother    Breast cancer Maternal Aunt     Social History: Social History   Tobacco Use   Smoking status: Never   Smokeless tobacco: Never  Vaping Use   Vaping status: Never Used  Substance Use Topics   Alcohol use: Not Currently   Drug use: Not Currently    Types: Marijuana    Comment: +UDS 12/11/22 THC    Allergies:  Allergies  Allergen Reactions   Apple Juice    Chocolate     In moderation   Erythromycin Swelling    Meds:  Medications Prior to Admission  Medication Sig Dispense Refill Last Dose   acetaminophen (TYLENOL) 650 MG CR tablet Take 650 mg by mouth every 8 (eight) hours as needed for pain.   04/23/2023 at 1630   aspirin EC 81 MG tablet Take 1 tablet (81 mg total) by mouth daily. Swallow whole. 30 tablet 12 04/23/2023    famotidine (PEPCID) 20 MG tablet Take 1 tablet (20 mg total) by mouth 2 (two) times daily. 60 tablet 3 04/23/2023   albuterol (PROVENTIL HFA;VENTOLIN HFA) 108 (90 Base) MCG/ACT inhaler Inhale 1-2 puffs into the lungs every 6 (six) hours as needed for wheezing or shortness of breath. 1 Inhaler 0    ondansetron (ZOFRAN-ODT) 4 MG disintegrating tablet Take 1 tablet (4 mg total) by mouth every 8 (eight) hours as needed for nausea or vomiting. 30 tablet 1    Prenatal Vit-Fe Fumarate-FA (PRENATAL MULTIVITAMIN) TABS tablet Take 1 tablet by mouth daily at 12 noon.      Spacer/Aero-Holding Chambers (AEROCHAMBER PLUS) inhaler Use as instructed 1 each 2     I have reviewed patient's Past Medical Hx, Surgical Hx, Family Hx, Social Hx, medications and allergies.   ROS:  Review of Systems Other systems negative  Physical Exam  Patient Vitals for the past 24 hrs:  BP Temp Temp src Pulse Resp SpO2 Height Weight  04/23/23 2250 -- -- -- -- -- 98 % -- --  04/23/23 2237 122/88 -- -- 90 -- 98 % -- --  04/23/23 2216 115/62 98 F (36.7 C) Oral 90 19 98 % 5\' 7"  (1.702 m) 91.1 kg   Constitutional: Well-developed, well-nourished female in no  acute distress.  Cardiovascular: normal rate and rhythm Respiratory: normal effort, clear to auscultation bilaterally GI: Abd soft, non-tender, gravid appropriate for gestational age.   No rebound or guarding. MS: Extremities nontender, no edema, normal ROM Neurologic: Alert and oriented x 4.  GU: Neg CVAT.  PELVIC EXAM: Cervix pink, visually closed, friable, scant white creamy discharge, vaginal walls and external genitalia normal Bimanual exam: Cervix firm, posterior, neg CMT, uterus nontender, Fundal Height consistent with dates, adnexa without tenderness, enlargement, or mass  Dilation: Closed Effacement (%): Thick Cervical Position: Posterior Exam by:: Leanora Cover, MD  FHT:  Baseline 140 , moderate variability, accelerations present, no decelerations Contractions:  Irregular    Labs: Results for orders placed or performed during the hospital encounter of 04/23/23 (from the past 24 hour(s))  Urinalysis, Routine w reflex microscopic -Urine, Clean Catch     Status: Abnormal   Collection Time: 04/23/23 10:20 PM  Result Value Ref Range   Color, Urine YELLOW YELLOW   APPearance CLEAR CLEAR   Specific Gravity, Urine 1.012 1.005 - 1.030   pH 6.0 5.0 - 8.0   Glucose, UA NEGATIVE NEGATIVE mg/dL   Hgb urine dipstick NEGATIVE NEGATIVE   Bilirubin Urine NEGATIVE NEGATIVE   Ketones, ur NEGATIVE NEGATIVE mg/dL   Protein, ur NEGATIVE NEGATIVE mg/dL   Nitrite NEGATIVE NEGATIVE   Leukocytes,Ua TRACE (A) NEGATIVE   RBC / HPF 0-5 0 - 5 RBC/hpf   WBC, UA 0-5 0 - 5 WBC/hpf   Bacteria, UA NONE SEEN NONE SEEN   Squamous Epithelial / HPF 0-5 0 - 5 /HPF   Mucus PRESENT   Fern Test     Status: None   Collection Time: 04/23/23 11:47 PM  Result Value Ref Range   POCT Fern Test Negative = intact amniotic membranes    B/Positive/-- (06/05 1228)  Imaging:  No results found.  MAU Course/MDM: I have reviewed the triage vital signs and the nursing notes.   Pertinent labs & imaging results that were available during my care of the patient were reviewed by me and considered in my medical decision making (see chart for details).      I have reviewed her medical records including past results, notes and treatments.   I have ordered labs and reviewed results.  NST reviewed  Treatments in MAU included wet prep, GC/chlamydia, SSE.    Assessment: 1. [redacted] weeks gestation of pregnancy   2. Spotting affecting pregnancy in third trimester   3. Irregular uterine contractions   Uterine contractions without cervical change, and resolved spotting after intercourse.  Reactive NST.  SSE pooling negative, cervix visually closed yet friable. Ferning negative.   Plan: Discharge home Labor precautions and fetal kick counts Follow up in Office for prenatal visits and recheck  Pt  stable at time of discharge.  Wyn Forster, MD FMOB Fellow, Faculty practice [facility], Center for [facility]  04/23/2023 11:53 PM

## 2023-04-24 ENCOUNTER — Ambulatory Visit: Payer: Managed Care, Other (non HMO)

## 2023-04-24 DIAGNOSIS — Z3A33 33 weeks gestation of pregnancy: Secondary | ICD-10-CM

## 2023-04-24 DIAGNOSIS — O26853 Spotting complicating pregnancy, third trimester: Secondary | ICD-10-CM

## 2023-04-24 LAB — GC/CHLAMYDIA PROBE AMP (~~LOC~~) NOT AT ARMC
Chlamydia: NEGATIVE
Comment: NEGATIVE
Comment: NORMAL
Neisseria Gonorrhea: NEGATIVE

## 2023-04-24 LAB — WET PREP, GENITAL
Clue Cells Wet Prep HPF POC: NONE SEEN
Sperm: NONE SEEN
Trich, Wet Prep: NONE SEEN
WBC, Wet Prep HPF POC: >=10 — AB (ref <10)
Yeast Wet Prep HPF POC: NONE SEEN

## 2023-05-01 NOTE — Progress Notes (Unsigned)
   PRENATAL VISIT NOTE  Subjective:  Natasha Beard is a 27 y.o. G1P0000 at [redacted]w[redacted]d being seen today for ongoing prenatal care.  She is currently monitored for the following issues for this {Blank single:19197::"high-risk","low-risk"} pregnancy and has Supervision of low-risk pregnancy and Palpitations on their problem list.  Patient reports {sx:14538}.   .  .   . Denies leaking of fluid.   The following portions of the patient's history were reviewed and updated as appropriate: allergies, current medications, past family history, past medical history, past social history, past surgical history and problem list.   Objective:  There were no vitals filed for this visit.  Fetal Status:           General:  Alert, oriented and cooperative. Patient is in no acute distress.  Skin: Skin is warm and dry. No rash noted.   Cardiovascular: Normal heart rate noted  Respiratory: Normal respiratory effort, no problems with respiration noted  Abdomen: Soft, gravid, appropriate for gestational age.        Pelvic: {Blank single:19197::"Cervical exam performed in the presence of a chaperone","Cervical exam deferred"}        Extremities: Normal range of motion.     Mental Status: Normal mood and affect. Normal behavior. Normal judgment and thought content.   Assessment and Plan:  Pregnancy: G1P0000 at [redacted]w[redacted]d 1. Encounter for supervision of low-risk pregnancy in third trimester ***  2. [redacted] weeks gestation of pregnancy ***  {Blank single:19197::"Term","Preterm"} labor symptoms and general obstetric precautions including but not limited to vaginal bleeding, contractions, leaking of fluid and fetal movement were reviewed in detail with the patient. Please refer to After Visit Summary for other counseling recommendations.   No follow-ups on file.  Future Appointments  Date Time Provider Department Center  05/02/2023  1:55 PM Osborne Oman [facility]  05/09/2023  9:00 AM Thomasene Ripple, DO [facility]  None  05/16/2023  2:35 PM Osborne Oman [facility]  05/22/2023 10:00 AM Paseda, Baird Kay, FNP [facility] None    Bernerd Limbo, CNM

## 2023-05-02 ENCOUNTER — Ambulatory Visit (INDEPENDENT_AMBULATORY_CARE_PROVIDER_SITE_OTHER): Payer: Managed Care, Other (non HMO) | Admitting: Certified Nurse Midwife

## 2023-05-02 ENCOUNTER — Other Ambulatory Visit: Payer: Self-pay

## 2023-05-02 VITALS — BP 133/67 | HR 95 | Wt 203.0 lb

## 2023-05-02 DIAGNOSIS — Z3A34 34 weeks gestation of pregnancy: Secondary | ICD-10-CM

## 2023-05-02 DIAGNOSIS — Z3493 Encounter for supervision of normal pregnancy, unspecified, third trimester: Secondary | ICD-10-CM

## 2023-05-09 ENCOUNTER — Encounter: Payer: Self-pay | Admitting: Cardiology

## 2023-05-09 ENCOUNTER — Ambulatory Visit: Payer: Managed Care, Other (non HMO) | Attending: Cardiology | Admitting: Cardiology

## 2023-05-09 VITALS — BP 116/68 | HR 80 | Ht 67.0 in | Wt 208.0 lb

## 2023-05-09 DIAGNOSIS — I4729 Other ventricular tachycardia: Secondary | ICD-10-CM | POA: Diagnosis not present

## 2023-05-09 NOTE — Progress Notes (Signed)
Cardio-Obstetrics Clinic  Follow Up Note   Date:  05/09/2023   ID:  Natasha Beard, DOB 1996/02/19, MRN [id number]  PCP:  Patient, No Pcp Per   [facility] Providers Cardiologist:  Thomasene Ripple, DO  Electrophysiologist:  None        Referring MD: No ref. provider found   Chief Complaint: " I am doing well"  History of Present Illness:    Natasha Beard is a 27 y.o. female [G1P0000] who returns for follow up of nonsustained ventricular tachycardia episodes seen on ZIO monitor recently,  She reports feeling well overall, but tired. She experienced Braxton Hicks contractions a couple of weeks ago, which resulted in an emergency department visit. She plans to have a natural birth at a hospital. She also expresses interest in a home birth for future pregnancies. She denies any episodes of passing out, lightheadedness, or dizziness. She has a blood pressure cuff at home for monitoring. She is also planning to breastfeed and has taken a lactation class. 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  Prior CV Studies Reviewed: The following studies were reviewed today: Zio monitor   Past Medical History:  Diagnosis Date   Anxiety    Depression     Past Surgical History:  Procedure Laterality Date   NO PAST SURGERIES        OB History     Gravida  1   Para  0   Term  0   Preterm  0   AB  0   Living  0      SAB  0   IAB  0   Ectopic  0   Multiple  0   Live Births  0               Current Medications: Current Meds  Medication Sig   acetaminophen (TYLENOL) 650 MG CR tablet Take 650 mg by mouth every 8 (eight) hours as needed for pain.   albuterol (PROVENTIL HFA;VENTOLIN HFA) 108 (90 Base) MCG/ACT inhaler Inhale 1-2 puffs into the lungs every 6 (six) hours as needed for wheezing or shortness of breath.   aspirin EC 81 MG tablet Take 1 tablet (81 mg total) by mouth daily. Swallow whole.   famotidine (PEPCID) 20 MG tablet Take  1 tablet (20 mg total) by mouth 2 (two) times daily.   Prenatal Vit-Fe Fumarate-FA (PRENATAL MULTIVITAMIN) TABS tablet Take 1 tablet by mouth daily at 12 noon.   Spacer/Aero-Holding Chambers (AEROCHAMBER PLUS) inhaler Use as instructed     Allergies:   Apple juice, Chocolate, and Erythromycin   Social History   Socioeconomic History   Marital status: Single    Spouse name: Not on file   Number of children: Not on file   Years of education: Not on file   Highest education level: Not on file  Occupational History   Not on file  Tobacco Use   Smoking status: Never   Smokeless tobacco: Never  Vaping Use   Vaping status: Never Used  Substance and Sexual Activity   Alcohol use: Not Currently   Drug use: Not Currently    Types: Marijuana    Comment: +UDS 12/11/22 THC   Sexual activity: Yes  Other Topics Concern   Not on file  Social History Narrative   Not on file   Social Determinants of Health   Financial Resource Strain: Not on file  Food Insecurity: Food Insecurity Present (11/22/2022)  Hunger Vital Sign    Worried About Running Out of Food in the Last Year: Sometimes true    Ran Out of Food in the Last Year: Never true  Transportation Needs: No Transportation Needs (11/22/2022)   PRAPARE - Administrator, Civil Service (Medical): No    Lack of Transportation (Non-Medical): No  Physical Activity: Not on file  Stress: Not on file  Social Connections: Not on file      Family History  Problem Relation Age of Onset   Healthy Mother    Diabetes Father    Seizures Father    Breast cancer Maternal Grandmother    Breast cancer Maternal Aunt       ROS:   Please see the history of present illness.     All other systems reviewed and are negative.   Labs/EKG Reviewed:    EKG:   EKG was not  ordered today.   Recent Labs: 02/07/2023: ALT 25; BUN 5; Creatinine, Ser 0.41; Potassium 3.7; Sodium 133 03/07/2023: Hemoglobin 13.3; Platelets 155   Recent Lipid  Panel No results found for: "CHOL", "TRIG", "HDL", "CHOLHDL", "LDLCALC", "LDLDIRECT"  Physical Exam:    VS:  BP 116/68 (BP Location: Left Arm, Patient Position: Sitting, Cuff Size: Normal)   Pulse 80   Ht 5\' 7"  (1.702 m)   Wt 208 lb (94.3 kg)   LMP 08/31/2022 (Exact Date) Comment: [redacted] weeks pregnant  SpO2 98%   BMI 32.58 kg/m     Wt Readings from Last 3 Encounters:  05/09/23 208 lb (94.3 kg)  05/02/23 203 lb (92.1 kg)  04/23/23 200 lb 14.4 oz (91.1 kg)     GEN:  Well nourished, well developed in no acute distress HEENT: Normal NECK: No JVD; No carotid bruits LYMPHATICS: No lymphadenopathy CARDIAC: RRR, no murmurs, rubs, gallops RESPIRATORY:  Clear to auscultation without rales, wheezing or rhonchi  ABDOMEN: Soft, non-tender, non-distended MUSCULOSKELETAL:  No edema; No deformity  SKIN: Warm and dry NEUROLOGIC:  Alert and oriented x 3 PSYCHIATRIC:  Normal affect    Risk Assessment/Risk Calculators:     CARPREG II Risk Prediction Index Score:  1.  The patient's risk for a primary cardiac event is 5%.            ASSESSMENT & PLAN:   NSVT Obesity in pregnancy  She is doing well from a cardiovascular standpoint.  No need for any additional medications at this time.  Patient is at [redacted] weeks gestation and planning for a natural birth at a hospital. No complications reported. Patient is working with a doula and plans to breastfeed. -Continue prenatal care with OB/GYN. -Plan for a virtual visit postpartum.    Patient Instructions  Medication Instructions:  Your physician recommends that you continue on your current medications as directed. Please refer to the Current Medication list given to you today.  *If you need a refill on your cardiac medications before your next appointment, please call your pharmacy*   Follow-Up: At [facility], you and your health needs are our priority.  As part of our continuing mission to provide you with exceptional heart care,  we have created designated Provider Care Teams.  These Care Teams include your primary Cardiologist (physician) and Advanced Practice Providers (APPs -  Physician Assistants and Nurse Practitioners) who all work together to provide you with the care you need, when you need it.  Your next appointment:   4 week(s) via MyChart - overbook   Provider:  Imanni Burdine, DO    Dispo:  No follow-ups on file.   Medication Adjustments/Labs and Tests Ordered: Current medicines are reviewed at length with the patient today.  Concerns regarding medicines are outlined above.  Tests Ordered: No orders of the defined types were placed in this encounter.  Medication Changes: No orders of the defined types were placed in this encounter.

## 2023-05-09 NOTE — Patient Instructions (Addendum)
Medication Instructions:  Your physician recommends that you continue on your current medications as directed. Please refer to the Current Medication list given to you today.  *If you need a refill on your cardiac medications before your next appointment, please call your pharmacy*   Follow-Up: At [facility], you and your health needs are our priority.  As part of our continuing mission to provide you with exceptional heart care, we have created designated Provider Care Teams.  These Care Teams include your primary Cardiologist (physician) and Advanced Practice Providers (APPs -  Physician Assistants and Nurse Practitioners) who all work together to provide you with the care you need, when you need it.  Your next appointment:   4 week(s) via MyChart - overbook   Provider:   Thomasene Ripple, DO

## 2023-05-15 NOTE — Progress Notes (Unsigned)
   PRENATAL VISIT NOTE  Subjective:  Natasha Beard is a 27 y.o. G1P0000 at [redacted]w[redacted]d being seen today for ongoing prenatal care.  She is currently monitored for the following issues for this {Blank single:19197::"high-risk","low-risk"} pregnancy and has Supervision of low-risk pregnancy and Palpitations on their problem list.  Patient reports {sx:14538}.   .  .   . Denies leaking of fluid.   The following portions of the patient's history were reviewed and updated as appropriate: allergies, current medications, past family history, past medical history, past social history, past surgical history and problem list.   Objective:  There were no vitals filed for this visit.  Fetal Status:           General:  Alert, oriented and cooperative. Patient is in no acute distress.  Skin: Skin is warm and dry. No rash noted.   Cardiovascular: Normal heart rate noted  Respiratory: Normal respiratory effort, no problems with respiration noted  Abdomen: Soft, gravid, appropriate for gestational age.        Pelvic: {Blank single:19197::"Cervical exam performed in the presence of a chaperone","Cervical exam deferred"}        Extremities: Normal range of motion.     Mental Status: Normal mood and affect. Normal behavior. Normal judgment and thought content.   Assessment and Plan:  Pregnancy: G1P0000 at [redacted]w[redacted]d 1. Encounter for supervision of low-risk pregnancy in third trimester ***  2. [redacted] weeks gestation of pregnancy ***  {Blank single:19197::"Term","Preterm"} labor symptoms and general obstetric precautions including but not limited to vaginal bleeding, contractions, leaking of fluid and fetal movement were reviewed in detail with the patient. Please refer to After Visit Summary for other counseling recommendations.   No follow-ups on file.  Future Appointments  Date Time Provider Department Center  05/16/2023  2:35 PM Osborne Oman [facility]  05/22/2023 10:00 AM Donell Beers, FNP  [facility] None  05/30/2023  1:55 PM Osborne O[facility]  06/05/2023 10:00 AM Tobb, Lavona Mound, DO [facility] None    Bernerd Limbo, CNM

## 2023-05-16 ENCOUNTER — Ambulatory Visit (INDEPENDENT_AMBULATORY_CARE_PROVIDER_SITE_OTHER): Payer: Commercial Managed Care - HMO | Admitting: Certified Nurse Midwife

## 2023-05-16 ENCOUNTER — Encounter: Payer: Self-pay | Admitting: Certified Nurse Midwife

## 2023-05-16 ENCOUNTER — Other Ambulatory Visit: Payer: Self-pay

## 2023-05-16 VITALS — BP 119/75 | HR 91 | Wt 206.9 lb

## 2023-05-16 DIAGNOSIS — Z2911 Encounter for prophylactic immunotherapy for respiratory syncytial virus (RSV): Secondary | ICD-10-CM | POA: Diagnosis not present

## 2023-05-16 DIAGNOSIS — Z3A36 36 weeks gestation of pregnancy: Secondary | ICD-10-CM

## 2023-05-16 DIAGNOSIS — Z3493 Encounter for supervision of normal pregnancy, unspecified, third trimester: Secondary | ICD-10-CM

## 2023-05-16 NOTE — Progress Notes (Signed)
   PRENATAL VISIT NOTE  Subjective:  Natasha Beard is a 27 y.o. G1P0000 at [redacted]w[redacted]d being seen today for ongoing prenatal care.  She is currently monitored for the following issues for this low-risk pregnancy and has Supervision of low-risk pregnancy and Palpitations on their problem list.  Patient reports no complaints.  Contractions: Not present. Vag. Bleeding: None.  Movement: Present. Denies leaking of fluid.   The following portions of the patient's history were reviewed and updated as appropriate: allergies, current medications, past family history, past medical history, past social history, past surgical history and problem list.   Objective:   Vitals:   05/16/23 1512  BP: 119/75  Pulse: 91  Weight: 206 lb 14.4 oz (93.8 kg)    Fetal Status: Fetal Heart Rate (bpm): 135   Movement: Present     General:  Alert, oriented and cooperative. Patient is in no acute distress.  Skin: Skin is warm and dry. No rash noted.   Cardiovascular: Normal heart rate noted  Respiratory: Normal respiratory effort, no problems with respiration noted  Abdomen: Fundal Height: 36 cm. Soft, gravid, appropriate for gestational age.  Pain/Pressure: Absent     Pelvic: Not indicated         Extremities: Normal range of motion.  Edema: Mild pitting, slight indentation  Mental Status: Normal mood and affect. Normal behavior. Normal judgment and thought content.   Assessment and Plan:  Pregnancy: G1P0000 at [redacted]w[redacted]d  1. Encounter for supervision of low-risk pregnancy in third trimester - Patient stable and feeling well. FHR, FH wnl.  - Respiratory syncytial virus vaccine, preF, subunit, bivalent,(Abrysvo) - Culture, beta strep (group b only)  2. [redacted] weeks gestation of pregnancy - Anticipatory guidance about next visits/weeks of pregnancy given.  - Signs of labor reviewed and attached to AVS    Term labor symptoms and general obstetric precautions including but not limited to vaginal bleeding, contractions,  leaking of fluid and fetal movement were reviewed in detail with the patient. Please refer to After Visit Summary for other counseling recommendations.   No follow-ups on file.  Future Appointments  Date Time Provider Department Center  05/22/2023 10:00 AM Donell Beers, FNP [facility] None  05/30/2023  1:55 PM Osborne Oman [facility]  06/05/2023 10:00 AM Tobb, Lavona Mound, DO [facility] None    Ralene Muskrat, [place]

## 2023-05-19 LAB — CULTURE, BETA STREP (GROUP B ONLY): Strep Gp B Culture: POSITIVE — AB

## 2023-05-21 ENCOUNTER — Ambulatory Visit (INDEPENDENT_AMBULATORY_CARE_PROVIDER_SITE_OTHER): Payer: Managed Care, Other (non HMO) | Admitting: Family Medicine

## 2023-05-21 ENCOUNTER — Encounter: Payer: Self-pay | Admitting: Physician Assistant

## 2023-05-21 ENCOUNTER — Other Ambulatory Visit: Payer: Managed Care, Other (non HMO)

## 2023-05-21 ENCOUNTER — Encounter: Payer: Self-pay | Admitting: Certified Nurse Midwife

## 2023-05-21 VITALS — BP 124/86 | HR 112

## 2023-05-21 DIAGNOSIS — O368130 Decreased fetal movements, third trimester, not applicable or unspecified: Secondary | ICD-10-CM

## 2023-05-21 DIAGNOSIS — Z22330 Carrier of Group B streptococcus: Secondary | ICD-10-CM | POA: Insufficient documentation

## 2023-05-21 DIAGNOSIS — Z3A37 37 weeks gestation of pregnancy: Secondary | ICD-10-CM | POA: Diagnosis not present

## 2023-05-21 DIAGNOSIS — Z3493 Encounter for supervision of normal pregnancy, unspecified, third trimester: Secondary | ICD-10-CM

## 2023-05-21 DIAGNOSIS — B951 Streptococcus, group B, as the cause of diseases classified elsewhere: Secondary | ICD-10-CM | POA: Insufficient documentation

## 2023-05-21 NOTE — Progress Notes (Signed)
   PRENATAL VISIT NOTE  Subjective:  Natasha Beard is a 27 y.o. G1P0000 at [redacted]w[redacted]d being seen today for ongoing prenatal care.  She is currently monitored for the following issues for this low-risk pregnancy and has Supervision of low-risk pregnancy; Palpitations; and Positive GBS test on their problem list.  Patient reports no bleeding, no contractions, no cramping, and no leaking.  Contractions: Not present. Vag. Bleeding: None.  Movement: Present. Denies leaking of fluid.   The following portions of the patient's history were reviewed and updated as appropriate: allergies, current medications, past family history, past medical history, past social history, past surgical history and problem list.   Objective:   Vitals:   05/21/23 1534  BP: 124/86  Pulse: (!) 112    Fetal Status:     Movement: Present     General:  Alert, oriented and cooperative. Patient is in no acute distress.  Skin: Skin is warm and dry. No rash noted.   Cardiovascular: Normal heart rate noted  Respiratory: Normal respiratory effort, no problems with respiration noted  Abdomen: Soft, gravid, appropriate for gestational age.  Pain/Pressure: Present     Pelvic: Cervical exam deferred        Extremities: Normal range of motion.  Edema: Trace  Mental Status: Normal mood and affect. Normal behavior. Normal judgment and thought content.   Assessment and Plan:  Pregnancy: G1P0000 at [redacted]w[redacted]d 1. Encounter for supervision of low-risk pregnancy in third trimester FHR and BP appropriate today Decreased fetal movement see below  2. [redacted] weeks gestation of pregnancy GBS positive  3. Decreased fetal movements in third trimester, single or unspecified fetus Decreased fetal movement.  Discussed kick counts BPP today 8/8 Discussed MAU precautions No further questions or concerns.  Term labor symptoms and general obstetric precautions including but not limited to vaginal bleeding, contractions, leaking of fluid and fetal  movement were reviewed in detail with the patient. Please refer to After Visit Summary for other counseling recommendations.   No follow-ups on file.  Future Appointments  Date Time Provider Department Center  05/30/2023  1:55 PM Osborne Oman [facility]  06/05/2023 10:00 AM Thomasene Ripple, DO [facility] None  06/06/2023  8:00 AM Paseda, Baird Kay, FNP [facility] None    Celedonio Savage, MD

## 2023-05-22 ENCOUNTER — Ambulatory Visit: Payer: Managed Care, Other (non HMO) | Admitting: Nurse Practitioner

## 2023-05-26 ENCOUNTER — Encounter: Payer: Self-pay | Admitting: Certified Nurse Midwife

## 2023-05-27 ENCOUNTER — Encounter (HOSPITAL_COMMUNITY): Payer: Self-pay | Admitting: Family Medicine

## 2023-05-27 ENCOUNTER — Inpatient Hospital Stay (HOSPITAL_COMMUNITY)
Admission: AD | Admit: 2023-05-27 | Discharge: 2023-05-27 | Disposition: A | Discharge status: Home / Self Care | Payer: Commercial Managed Care - HMO | Attending: Family Medicine | Admitting: Family Medicine

## 2023-05-27 DIAGNOSIS — Z0371 Encounter for suspected problem with amniotic cavity and membrane ruled out: Secondary | ICD-10-CM | POA: Diagnosis not present

## 2023-05-27 DIAGNOSIS — Z3483 Encounter for supervision of other normal pregnancy, third trimester: Secondary | ICD-10-CM | POA: Diagnosis present

## 2023-05-27 DIAGNOSIS — Z3A38 38 weeks gestation of pregnancy: Secondary | ICD-10-CM | POA: Insufficient documentation

## 2023-05-27 DIAGNOSIS — Z3689 Encounter for other specified antenatal screening: Secondary | ICD-10-CM

## 2023-05-27 LAB — RUPTURE OF MEMBRANE (ROM)PLUS: Rom Plus: NEGATIVE

## 2023-05-27 LAB — POCT FERN TEST: POCT Fern Test: NEGATIVE

## 2023-05-27 NOTE — MAU Note (Addendum)
.  Natasha Beard is a 27 y.o. at [redacted]w[redacted]d here in MAU reporting leaking clear fld since 12noon Sat but did not think much of it. Ctxs started Friday but stronger since Sat evening. Reports good FM. No recent SVE. No VB. Desires water birth  Onset of complaint: 12n Sat Pain score: 5 Vitals:   05/27/23 0043 05/27/23 0046  BP:  133/75  Pulse: 88   Resp: 18   Temp: 97.8 F (36.6 C)   SpO2: 99%      FHT:132 Lab orders placed from triage:  labor eval

## 2023-05-27 NOTE — MAU Provider Note (Signed)
S: Ms. Natasha Beard is a 27 y.o. G1P0000 at [redacted]w[redacted]d  who presents to MAU today complaining of leaking of fluid since Friday around noon. She denies vaginal bleeding. She endorses contractions that started on Friday as well. She reports normal fetal movement.    O: BP 124/78   Pulse 90   Temp 97.8 F (36.6 C)   Resp 18   Ht 5\' 7"  (1.702 m)   Wt 94.8 kg   LMP 08/31/2022 (Exact Date) Comment: [redacted] weeks pregnant  SpO2 99%   BMI 32.73 kg/m  GENERAL: Well-developed, well-nourished female in no acute distress.  HEAD: Normocephalic, atraumatic.  CHEST: Normal effort of breathing, regular heart rate ABDOMEN: Soft, nontender, gravid PELVIC: Speculum exam deferred. ROM plus completed by nurse before provider arrived at bedside.   Cervical exam:  Dilation: Closed Effacement (%): Thick Exam by:: Blue, RN   Fetal Monitoring: FHT: 135 bpm, Mod Var, -Decels, +Accels Toco: Q8-10 min, Irritability  Results for orders placed or performed during the hospital encounter of 05/27/23 (from the past 24 hour(s))  Rupture of Membrane (ROM) Plus     Status: None   Collection Time: 05/27/23  1:38 AM  Result Value Ref Range   Rom Plus NEGATIVE   POCT fern test     Status: Normal   Collection Time: 05/27/23  1:41 AM  Result Value Ref Range   POCT Fern Test Negative = intact amniotic membranes      A: SIUP at [redacted]w[redacted]d  Membranes intact Cat I FT  P: NST reactive Discussed negative ROM plus results. Patient denies concerns. Patient agreeable to vaginal exam. As above. Information for vaginal delivery placed in AVS for patient review. Return to MAU as needed. Keep next appt as scheduled: Dec 11th Discharge to home in stable condition.  Gerrit Heck, CNM 05/27/2023 2:13 AM

## 2023-05-29 NOTE — Progress Notes (Signed)
   PRENATAL VISIT NOTE  Subjective:  Natasha Beard is a 27 y.o. G1P0000 at [redacted]w[redacted]d being seen today for ongoing prenatal care.  She is currently monitored for the following issues for this low-risk pregnancy and has Supervision of low-risk pregnancy; Palpitations; and Positive GBS test on their problem list.  Patient reports no complaints.  Contractions: Irregular. Vag. Bleeding: None.  Movement: Present. Denies leaking of fluid.   The following portions of the patient's history were reviewed and updated as appropriate: allergies, current medications, past family history, past medical history, past social history, past surgical history and problem list.   Objective:   Vitals:   05/30/23 1408  BP: 115/72  Pulse: 86  Weight: 210 lb 12.8 oz (95.6 kg)    Fetal Status: Fetal Heart Rate (bpm): 145   Movement: Present     General:  Alert, oriented and cooperative. Patient is in no acute distress.  Skin: Skin is warm and dry. No rash noted.   Cardiovascular: Normal heart rate noted  Respiratory: Normal respiratory effort, no problems with respiration noted  Abdomen: Soft, gravid, appropriate for gestational age.  Pain/Pressure: Present     Pelvic: Cervical exam deferred        Extremities: Normal range of motion.  Edema: Trace  Mental Status: Normal mood and affect. Normal behavior. Normal judgment and thought content.   Assessment and Plan:  Pregnancy: G1P0000 at [redacted]w[redacted]d 1. Encounter for supervision of low-risk pregnancy in third trimester - Doing well, feeling regular and vigorous fetal movement   2. [redacted] weeks gestation of pregnancy - Routine OB care, offered membrane sweep at next visit  3. Positive GBS test - Will treat in labor  Term labor symptoms and general obstetric precautions including but not limited to vaginal bleeding, contractions, leaking of fluid and fetal movement were reviewed in detail with the patient. Please refer to After Visit Summary for other counseling  recommendations.   Return in about 1 week (around 06/06/2023) for IN-PERSON, LOB.  Future Appointments  Date Time Provider Department Center  06/05/2023 10:00 AM Tobb, Kardie, DO [facility] None  06/06/2023  8:00 AM Donell Beers, FNP [facility] None  06/06/2023  8:55 AM Bernerd Limbo, CNM [facility]  06/14/2023  1:35 PM Osborne Oman [facility]  06/14/2023  1:55 PM [facility]    Bernerd Limbo, CNM

## 2023-05-30 ENCOUNTER — Ambulatory Visit (INDEPENDENT_AMBULATORY_CARE_PROVIDER_SITE_OTHER): Payer: Commercial Managed Care - HMO | Admitting: Certified Nurse Midwife

## 2023-05-30 ENCOUNTER — Other Ambulatory Visit: Payer: Self-pay

## 2023-05-30 VITALS — BP 115/72 | HR 86 | Wt 210.8 lb

## 2023-05-30 DIAGNOSIS — Z3A38 38 weeks gestation of pregnancy: Secondary | ICD-10-CM

## 2023-05-30 DIAGNOSIS — B951 Streptococcus, group B, as the cause of diseases classified elsewhere: Secondary | ICD-10-CM

## 2023-05-30 DIAGNOSIS — O23593 Infection of other part of genital tract in pregnancy, third trimester: Secondary | ICD-10-CM

## 2023-05-30 DIAGNOSIS — Z3493 Encounter for supervision of normal pregnancy, unspecified, third trimester: Secondary | ICD-10-CM

## 2023-06-05 ENCOUNTER — Encounter: Payer: Self-pay | Admitting: Cardiology

## 2023-06-05 ENCOUNTER — Ambulatory Visit: Payer: Commercial Managed Care - HMO | Attending: Cardiology | Admitting: Cardiology

## 2023-06-05 VITALS — BP 116/68 | HR 84 | Ht 67.0 in | Wt 210.0 lb

## 2023-06-05 DIAGNOSIS — I4729 Other ventricular tachycardia: Secondary | ICD-10-CM | POA: Diagnosis not present

## 2023-06-05 DIAGNOSIS — O99213 Obesity complicating pregnancy, third trimester: Secondary | ICD-10-CM

## 2023-06-05 DIAGNOSIS — Z3A39 39 weeks gestation of pregnancy: Secondary | ICD-10-CM

## 2023-06-05 DIAGNOSIS — E669 Obesity, unspecified: Secondary | ICD-10-CM | POA: Diagnosis not present

## 2023-06-05 NOTE — Patient Instructions (Addendum)
Medication Instructions:  No changes.  *If you need a refill on your cardiac medications before your next appointment, please call your pharmacy*    Follow-Up: At [facility], you and your health needs are our priority.  As part of our continuing mission to provide you with exceptional heart care, we have created designated Provider Care Teams.  These Care Teams include your primary Cardiologist (physician) and Advanced Practice Providers (APPs -  Physician Assistants and Nurse Practitioners) who all work together to provide you with the care you need, when you need it.  Your next appointment:   4 week(s)  Provider:   Thomasene Ripple, DO     Virtual for post partum.

## 2023-06-05 NOTE — Progress Notes (Signed)
Cardio-Obstetrics Clinic  Follow Up Note   Date:  06/05/2023   ID:  Natasha Beard, DOB 11/01/1995, MRN [id number]  PCP:  Patient, No Pcp Per   [facility] Providers Cardiologist:  Thomasene Ripple, DO  Electrophysiologist:  None        Referring MD: No ref. provider found   Chief Complaint: " I am tired"  She is at home. In the office.  Virtual Visit via Video  Note . I connected with the patient today by a   video enabled telemedicine application and verified that I am speaking with the correct person using two identifiers.  History of Present Illness:    Natasha Beard is a 27 y.o. female [G1P0000] who returns for follow up for cardiovascular care in pregnancy.  Medical history includes a short run of NSVT seen on her ZIO monitor.  She offers no complaints today.  She is looking forward to delivery.   Prior CV Studies Reviewed: The following studies were reviewed today: Zio monitor   Past Medical History:  Diagnosis Date   Anxiety    Depression     Past Surgical History:  Procedure Laterality Date   NO PAST SURGERIES        OB History     Gravida  1   Para  0   Term  0   Preterm  0   AB  0   Living  0      SAB  0   IAB  0   Ectopic  0   Multiple  0   Live Births  0               Current Medications: Current Meds  Medication Sig   aspirin EC 81 MG tablet Take 1 tablet (81 mg total) by mouth daily. Swallow whole.   famotidine (PEPCID) 20 MG tablet Take 1 tablet (20 mg total) by mouth 2 (two) times daily.   Prenatal Vit-Fe Fumarate-FA (PRENATAL MULTIVITAMIN) TABS tablet Take 1 tablet by mouth daily at 12 noon.     Allergies:   Apple juice, Chocolate, and Erythromycin   Social History   Socioeconomic History   Marital status: Single    Spouse name: Not on file   Number of children: Not on file   Years of education: Not on file   Highest education level: Not on file  Occupational History   Not on file  Tobacco Use    Smoking status: Never   Smokeless tobacco: Never  Vaping Use   Vaping status: Never Used  Substance and Sexual Activity   Alcohol use: Not Currently   Drug use: Not Currently    Types: Marijuana    Comment: +UDS 12/11/22 THC   Sexual activity: Yes  Other Topics Concern   Not on file  Social History Narrative   Not on file   Social Drivers of Health   Financial Resource Strain: Not on file  Food Insecurity: Food Insecurity Present (11/22/2022)   Hunger Vital Sign    Worried About Running Out of Food in the Last Year: Sometimes true    Ran Out of Food in the Last Year: Never true  Transportation Needs: No Transportation Needs (11/22/2022)   PRAPARE - Administrator, Civil Service (Medical): No    Lack of Transportation (Non-Medical): No  Physical Activity: Not on file  Stress: Not on file  Social Connections: Not on file      Family History  Problem Relation  Age of Onset   Healthy Mother    Diabetes Father    Seizures Father    Breast cancer Maternal Grandmother    Breast cancer Maternal Aunt       ROS:   Please see the history of present illness.     All other systems reviewed and are negative.   Labs/EKG Reviewed:    EKG:   EKG was not ordered today  Recent Labs: 02/07/2023: ALT 25; BUN 5; Creatinine, Ser 0.41; Potassium 3.7; Sodium 133 03/07/2023: Hemoglobin 13.3; Platelets 155   Recent Lipid Panel No results found for: "CHOL", "TRIG", "HDL", "CHOLHDL", "LDLCALC", "LDLDIRECT"  Physical Exam:    VS:  BP 116/68 (BP Location: Right Arm, Patient Position: Sitting, Cuff Size: Normal)   Pulse 84   Ht 5\' 7"  (1.702 m)   Wt 210 lb (95.3 kg)   LMP 08/31/2022 (Exact Date) Comment: [redacted] weeks pregnant  BMI 32.89 kg/m     Wt Readings from Last 3 Encounters:  06/05/23 210 lb (95.3 kg)  05/30/23 210 lb 12.8 oz (95.6 kg)  05/27/23 209 lb (94.8 kg)       Risk Assessment/Risk Calculators:     CARPREG II Risk Prediction Index Score:  1.  The patient's  risk for a primary cardiac event is 5%.   Modified World Health Organization [facility]) Classification of Maternal CV Risk   Class I         ASSESSMENT & PLAN:    NSVT Obesity in pregnancy   She is doing well from a CV standpoint.  Will see her postpartum  Patient Instructions  Medication Instructions:  No changes.  *If you need a refill on your cardiac medications before your next appointment, please call your pharmacy*    Follow-Up: At [facility], you and your health needs are our priority.  As part of our continuing mission to provide you with exceptional heart care, we have created designated Provider Care Teams.  These Care Teams include your primary Cardiologist (physician) and Advanced Practice Providers (APPs -  Physician Assistants and Nurse Practitioners) who all work together to provide you with the care you need, when you need it.  Your next appointment:   4 week(s)  Provider:   Thomasene Ripple, DO     Virtual for post partum.         Dispo:  No follow-ups on file.   Medication Adjustments/Labs and Tests Ordered: Current medicines are reviewed at length with the patient today.  Concerns regarding medicines are outlined above.  Tests Ordered: No orders of the defined types were placed in this encounter.  Medication Changes: No orders of the defined types were placed in this encounter.

## 2023-06-06 ENCOUNTER — Inpatient Hospital Stay (HOSPITAL_COMMUNITY)
Admission: AD | Admit: 2023-06-06 | Discharge: 2023-06-08 | DRG: 807 | Disposition: A | Discharge status: Home / Self Care | Payer: Commercial Managed Care - HMO | Attending: Obstetrics and Gynecology | Admitting: Obstetrics and Gynecology

## 2023-06-06 ENCOUNTER — Ambulatory Visit: Payer: Managed Care, Other (non HMO) | Admitting: Nurse Practitioner

## 2023-06-06 ENCOUNTER — Encounter (HOSPITAL_COMMUNITY): Payer: Self-pay | Admitting: Family Medicine

## 2023-06-06 ENCOUNTER — Other Ambulatory Visit: Payer: Self-pay

## 2023-06-06 ENCOUNTER — Inpatient Hospital Stay (HOSPITAL_COMMUNITY): Payer: Commercial Managed Care - HMO | Admitting: Anesthesiology

## 2023-06-06 ENCOUNTER — Ambulatory Visit (INDEPENDENT_AMBULATORY_CARE_PROVIDER_SITE_OTHER): Payer: Commercial Managed Care - HMO | Admitting: Certified Nurse Midwife

## 2023-06-06 VITALS — BP 138/77 | HR 85 | Wt 217.2 lb

## 2023-06-06 DIAGNOSIS — Z833 Family history of diabetes mellitus: Secondary | ICD-10-CM

## 2023-06-06 DIAGNOSIS — O99214 Obesity complicating childbirth: Secondary | ICD-10-CM | POA: Diagnosis present

## 2023-06-06 DIAGNOSIS — Z3493 Encounter for supervision of normal pregnancy, unspecified, third trimester: Secondary | ICD-10-CM

## 2023-06-06 DIAGNOSIS — Z3A39 39 weeks gestation of pregnancy: Secondary | ICD-10-CM

## 2023-06-06 DIAGNOSIS — O99824 Streptococcus B carrier state complicating childbirth: Secondary | ICD-10-CM | POA: Diagnosis present

## 2023-06-06 DIAGNOSIS — O99820 Streptococcus B carrier state complicating pregnancy: Secondary | ICD-10-CM | POA: Diagnosis not present

## 2023-06-06 DIAGNOSIS — B951 Streptococcus, group B, as the cause of diseases classified elsewhere: Secondary | ICD-10-CM | POA: Diagnosis present

## 2023-06-06 DIAGNOSIS — Z22330 Carrier of Group B streptococcus: Secondary | ICD-10-CM | POA: Diagnosis present

## 2023-06-06 DIAGNOSIS — O26893 Other specified pregnancy related conditions, third trimester: Secondary | ICD-10-CM | POA: Diagnosis present

## 2023-06-06 LAB — CBC
HCT: 39.2 % (ref 36.0–46.0)
Hemoglobin: 13.3 g/dL (ref 12.0–15.0)
MCH: 31 pg (ref 26.0–34.0)
MCHC: 33.9 g/dL (ref 30.0–36.0)
MCV: 91.4 fL (ref 80.0–100.0)
Platelets: 162 10*3/uL (ref 150–400)
RBC: 4.29 MIL/uL (ref 3.87–5.11)
RDW: 14.6 % (ref 11.5–15.5)
WBC: 10.8 10*3/uL — ABNORMAL HIGH (ref 4.0–10.5)
nRBC: 0 % (ref 0.0–0.2)

## 2023-06-06 LAB — TYPE AND SCREEN
ABO/RH(D): B POS
Antibody Screen: NEGATIVE

## 2023-06-06 MED ORDER — FENTANYL CITRATE (PF) 100 MCG/2ML IJ SOLN: 100.0000 ug | INTRAMUSCULAR | Status: DC | PRN

## 2023-06-06 MED ORDER — TERBUTALINE SULFATE 1 MG/ML IJ SOLN
0.2500 mg | Freq: Once | INTRAMUSCULAR | Status: AC
Start: 2023-06-06 — End: 2023-06-06

## 2023-06-06 MED ORDER — TERBUTALINE SULFATE 1 MG/ML IJ SOLN
INTRAMUSCULAR | Status: AC
  Administered 2023-06-06: 0.25 mg via SUBCUTANEOUS
  Filled 2023-06-06: qty 1

## 2023-06-06 MED ORDER — LIDOCAINE HCL (PF) 1 % IJ SOLN: 30.0000 mL | INTRAMUSCULAR | Status: DC | PRN

## 2023-06-06 MED ORDER — SOD CITRATE-CITRIC ACID 500-334 MG/5ML PO SOLN: 30.0000 mL | ORAL | Status: DC | PRN

## 2023-06-06 MED ORDER — SODIUM CHLORIDE 0.9 % IV SOLN
5.0000 10*6.[IU] | Freq: Once | INTRAVENOUS | Status: AC
  Administered 2023-06-06: 5 10*6.[IU] via INTRAVENOUS
  Filled 2023-06-06: qty 5

## 2023-06-06 MED ORDER — LACTATED RINGERS IV SOLN: INTRAVENOUS | Status: DC

## 2023-06-06 MED ORDER — PENICILLIN G POT IN DEXTROSE 60000 UNIT/ML IV SOLN
3.0000 10*6.[IU] | INTRAVENOUS | Status: DC
  Administered 2023-06-06 (×2): 3 10*6.[IU] via INTRAVENOUS
  Filled 2023-06-06 (×3): qty 50

## 2023-06-06 MED ORDER — EPHEDRINE 5 MG/ML INJ
10.0000 mg | INTRAVENOUS | Status: DC | PRN
Start: 2023-06-06 — End: 2023-06-07

## 2023-06-06 MED ORDER — FLEET ENEMA RE ENEM: 1.0000 | ENEMA | RECTAL | Status: DC | PRN

## 2023-06-06 MED ORDER — PHENYLEPHRINE 80 MCG/ML (10ML) SYRINGE FOR IV PUSH (FOR BLOOD PRESSURE SUPPORT)
80.0000 ug | PREFILLED_SYRINGE | INTRAVENOUS | Status: DC | PRN
Start: 2023-06-06 — End: 2023-06-07

## 2023-06-06 MED ORDER — DIPHENHYDRAMINE HCL 50 MG/ML IJ SOLN: 12.5000 mg | INTRAMUSCULAR | Status: DC | PRN

## 2023-06-06 MED ORDER — LACTATED RINGERS IV SOLN: 500.0000 mL | Freq: Once | INTRAVENOUS | Status: DC

## 2023-06-06 MED ORDER — FENTANYL-BUPIVACAINE-NACL 0.5-0.125-0.9 MG/250ML-% EP SOLN
12.0000 mL/h | EPIDURAL | Status: DC | PRN
  Administered 2023-06-06: 12 mL/h via EPIDURAL

## 2023-06-06 MED ORDER — OXYTOCIN BOLUS FROM INFUSION
333.0000 mL | Freq: Once | INTRAVENOUS | Status: AC
  Administered 2023-06-07: 333 mL via INTRAVENOUS

## 2023-06-06 MED ORDER — ONDANSETRON HCL 4 MG/2ML IJ SOLN
4.0000 mg | Freq: Four times a day (QID) | INTRAMUSCULAR | Status: DC | PRN
  Administered 2023-06-06: 4 mg via INTRAVENOUS
  Filled 2023-06-06: qty 2

## 2023-06-06 MED ORDER — LIDOCAINE HCL (PF) 1 % IJ SOLN
INTRAMUSCULAR | Status: DC | PRN
  Administered 2023-06-06: 4 mL via EPIDURAL
  Administered 2023-06-06: 5 mL via EPIDURAL

## 2023-06-06 MED ORDER — EPHEDRINE 5 MG/ML INJ: 10.0000 mg | INTRAVENOUS | Status: DC | PRN

## 2023-06-06 MED ORDER — OXYCODONE-ACETAMINOPHEN 5-325 MG PO TABS: 2.0000 | ORAL_TABLET | ORAL | Status: DC | PRN

## 2023-06-06 MED ORDER — OXYCODONE-ACETAMINOPHEN 5-325 MG PO TABS: 1.0000 | ORAL_TABLET | ORAL | Status: DC | PRN

## 2023-06-06 MED ORDER — OXYTOCIN-SODIUM CHLORIDE 30-0.9 UT/500ML-% IV SOLN
2.5000 [IU]/h | INTRAVENOUS | Status: DC
  Filled 2023-06-06: qty 500

## 2023-06-06 MED ORDER — ACETAMINOPHEN 325 MG PO TABS: 650.0000 mg | ORAL_TABLET | ORAL | Status: DC | PRN

## 2023-06-06 MED ORDER — FENTANYL-BUPIVACAINE-NACL 0.5-0.125-0.9 MG/250ML-% EP SOLN
EPIDURAL | Status: AC
  Filled 2023-06-06: qty 250

## 2023-06-06 NOTE — H&P (Signed)
Natasha Beard is a 27 y.o. female presenting for contractions at [redacted]w[redacted]d. Patient reports painful contractions that being around 0300 this morning and increased in intensity and were ~3 minutes apart. Patient presented for her OB prenatal appointment and was sent over from the office. She denies any VB or LOF.  OB History     Gravida  1   Para  0   Term  0   Preterm  0   AB  0   Living  0      SAB  0   IAB  0   Ectopic  0   Multiple  0   Live Births  0          Past Medical History:  Diagnosis Date   Anxiety    Depression    Past Surgical History:  Procedure Laterality Date   NO PAST SURGERIES     Family History: family history includes Breast cancer in her maternal aunt and maternal grandmother; Diabetes in her father; Healthy in her mother; Seizures in her father. Social History:  reports that she has never smoked. She has never used smokeless tobacco. She reports that she does not currently use alcohol. She reports that she does not currently use drugs after having used the following drugs: Marijuana.     Maternal Diabetes: No Genetic Screening: Normal Maternal Ultrasounds/Referrals: Normal Fetal Ultrasounds or other Referrals:  None Maternal Substance Abuse:  No Significant Maternal Medications:  None Significant Maternal Lab Results:  Group B Strep positive Number of Prenatal Visits:greater than 3 verified prenatal visits Maternal Vaccinations:RSV: Given during pregnancy >/=14 days ago, TDap, and Flu Other Comments:  None  Review of Systems  Constitutional: Negative.    Maternal Medical History:  Reason for admission: Contractions.   Contractions: Onset was 6-12 hours ago.   Frequency: regular.   Duration is approximately 3 minutes.   Perceived severity is strong.   Fetal activity: Perceived fetal activity is normal.   Last perceived fetal movement was within the past hour.   Prenatal Complications - Diabetes: none.   Dilation: 5 Effacement  (%): 90, 80 Station: 0 Exam by:: Debe Coder, cnm Blood pressure 134/72, pulse 75, resp. rate 18, last menstrual period 08/31/2022. Maternal Exam:  Uterine Assessment: Contraction strength is moderate.  Contraction duration is 3 minutes. Contraction frequency is regular.  Abdomen: Patient reports no abdominal tenderness. Fetal presentation: vertex Introitus: Normal vulva.   Physical Exam Vitals reviewed.  Constitutional:      Appearance: Normal appearance.  Pulmonary:     Effort: Pulmonary effort is normal.  Abdominal:     Comments: Gravid. Appropriate resting tone between contractions. FM+  Genitourinary:    General: Normal vulva.  Musculoskeletal:        General: No swelling.  Skin:    General: Skin is warm and dry.  Neurological:     Mental Status: She is alert and oriented to person, place, and time.  Psychiatric:        Mood and Affect: Mood normal.        Behavior: Behavior normal.        Thought Content: Thought content normal.     Prenatal labs: ABO, Rh: --/--/B POS (12/18 1142) Antibody: NEG (12/18 1142) Rubella: 15.30 (06/05 1228) RPR: Non Reactive (09/18 0833)  HBsAg: Negative (06/05 1228)  HIV: Non Reactive (09/18 3664)  GBS: Positive/-- (11/27 0436)   Assessment/Plan: -peripheral IV -comfort measures -IA -GBS+. PCN now -water birth  Birth Plan: Desires SVD  Up adlib Amniotomy Comfort measures>Nitrous > IV pain meds > Epidural Water Birth Delayed cord clamping FOB to cut cord [place] All meds for baby Circ DepoProvera    Darrell Jewel, SNM 06/06/2023, 2:17 PM

## 2023-06-06 NOTE — Anesthesia Procedure Notes (Signed)
Epidural Patient location during procedure: OB Start time: 06/06/2023 6:49 PM End time: 06/06/2023 6:56 PM  Staffing Anesthesiologist: Beryle Lathe, MD Performed: anesthesiologist   Preanesthetic Checklist Completed: patient identified, IV checked, risks and benefits discussed, monitors and equipment checked, pre-op evaluation and timeout performed  Epidural Patient position: sitting Prep: DuraPrep Patient monitoring: continuous pulse ox and blood pressure Approach: midline Location: L3-L4 Injection technique: LOR saline  Needle:  Needle type: Tuohy  Needle gauge: 17 G Needle length: 9 cm Needle insertion depth: 9 cm Catheter size: 19 Gauge Catheter at skin depth: 14 cm Test dose: negative and Other (1% lidocaine)  Assessment Events: blood not aspirated and no cerebrospinal fluid  Additional Notes Patient identified. Risks including, but not limited to, bleeding, infection, nerve damage, paralysis, inadequate analgesia, blood pressure changes, nausea, vomiting, allergic reaction, postpartum back pain, itching, and headache were discussed. Patient expressed understanding and wished to proceed. Sterile prep and drape, including hand hygiene, mask, and sterile gloves were used. The patient was positioned and the spine was prepped. The skin was anesthetized with lidocaine. No paraesthesia or other complication noted. The patient did not experience any signs of intravascular injection such as tinnitus or metallic taste in mouth, nor signs of intrathecal spread such as rapid motor block. Please see nursing notes for vital signs. The patient tolerated the procedure well.   Leslye Peer, MDReason for block:procedure for pain

## 2023-06-06 NOTE — Progress Notes (Signed)
Patient ID: Natasha Beard, female   DOB: 1996-02-21, 27 y.o.   MRN: [id number]  CTSP for FHR early decels to 90s w each ctx since epidural placement; BP stable with some mild range elevations; cx now 9cm; given Terb, IVF bolus, and placed in H&K position which has made the variables more mild/shallow. Having some rectal pressure w ctx now but it's bearable; will await the urge to push.  Arabella Merles CNM 06/06/2023 7:30 PM

## 2023-06-06 NOTE — Progress Notes (Addendum)
Natasha Beard is a 27 y.o. G1P0000 at [redacted]w[redacted]d by LMP admitted for induction of labor due to contractions.  Subjective: Patient is uncomfortable but tolerating contractions well, using Nitrous Oxide. FOB, patient's father and step-mother at the bedside.    Objective: BP 135/82   Pulse 79   Temp 98.3 F (36.8 C) (Oral)   Resp 16   LMP 08/31/2022 (Exact Date) Comment: [redacted] weeks pregnant No intake/output data recorded. No intake/output data recorded.  FHT:  FHR: 140 bpm, variability: moderate,  accelerations:  Present,  decelerations:  Present , early UC:   regular, every 3-5 minutes SVE:   Dilation: 6 Effacement (%): 80 Station: 0 Exam by:: Sallye Ober, snm  Labs: Lab Results  Component Value Date   WBC 10.8 (H) 06/06/2023   HGB 13.3 06/06/2023   HCT 39.2 06/06/2023   MCV 91.4 06/06/2023   PLT 162 06/06/2023    Assessment / Plan: -Spontaneous labor, progressing normally -SVE 6/80/0 > AROM minimal dark bloody show -Refused hydrotherapy at this time time. Will continue with nitrous.  Labor: Progressing normally Preeclampsia:   N/A Fetal Wellbeing:  Category I Pain Control:  Nitrous Oxide I/D:   GBS+ adequately treated. Continue with PCN Anticipated MOD:  NSVD  Darrell Jewel, Student-MidWife 06/06/2023, 4:46 PM

## 2023-06-06 NOTE — Anesthesia Preprocedure Evaluation (Signed)
Anesthesia Evaluation  Patient identified by MRN, date of birth, ID band Patient awake    Reviewed: Allergy & Precautions, NPO status , Patient's Chart, lab work & pertinent test results  History of Anesthesia Complications Negative for: history of anesthetic complications  Airway Mallampati: II   Neck ROM: Full    Dental   Pulmonary neg pulmonary ROS   Pulmonary exam normal        Cardiovascular negative cardio ROS Normal cardiovascular exam     Neuro/Psych  PSYCHIATRIC DISORDERS Anxiety Depression    negative neurological ROS     GI/Hepatic negative GI ROS, Neg liver ROS,,,  Endo/Other   Obesity   Renal/GU negative Renal ROS     Musculoskeletal negative musculoskeletal ROS (+)    Abdominal   Peds  Hematology negative hematology ROS (+)  Plt 162k    Anesthesia Other Findings   Reproductive/Obstetrics (+) Pregnancy                             Anesthesia Physical Anesthesia Plan  ASA: 2  Anesthesia Plan: Epidural   Post-op Pain Management:    Induction:   PONV Risk Score and Plan: 2 and Treatment may vary due to age or medical condition  Airway Management Planned: Natural Airway  Additional Equipment: None  Intra-op Plan:   Post-operative Plan:   Informed Consent: I have reviewed the patients History and Physical, chart, labs and discussed the procedure including the risks, benefits and alternatives for the proposed anesthesia with the patient or authorized representative who has indicated his/her understanding and acceptance.       Plan Discussed with: Anesthesiologist  Anesthesia Plan Comments: (Labs reviewed. Platelets acceptable, patient not taking any blood thinning medications. Per RN, FHR tracing reported to be stable enough for sitting procedure. Risks and benefits discussed with patient, including PDPH, backache, epidural hematoma, failed epidural, blood  pressure changes, allergic reaction, and nerve injury. Patient expressed understanding and wished to proceed.)       Anesthesia Quick Evaluation

## 2023-06-07 ENCOUNTER — Other Ambulatory Visit: Payer: Self-pay

## 2023-06-07 ENCOUNTER — Encounter (HOSPITAL_COMMUNITY): Payer: Self-pay | Admitting: Family Medicine

## 2023-06-07 DIAGNOSIS — O99820 Streptococcus B carrier state complicating pregnancy: Secondary | ICD-10-CM

## 2023-06-07 DIAGNOSIS — Z3A39 39 weeks gestation of pregnancy: Secondary | ICD-10-CM

## 2023-06-07 LAB — RPR: RPR Ser Ql: NONREACTIVE

## 2023-06-07 MED ORDER — SENNOSIDES-DOCUSATE SODIUM 8.6-50 MG PO TABS
2.0000 | ORAL_TABLET | ORAL | Status: DC
  Administered 2023-06-07 – 2023-06-08 (×2): 2 via ORAL
  Filled 2023-06-07 (×2): qty 2

## 2023-06-07 MED ORDER — PRENATAL MULTIVITAMIN CH
1.0000 | ORAL_TABLET | Freq: Every day | ORAL | Status: DC
  Administered 2023-06-07 – 2023-06-08 (×2): 1 via ORAL
  Filled 2023-06-07 (×2): qty 1

## 2023-06-07 MED ORDER — IBUPROFEN 600 MG PO TABS
600.0000 mg | ORAL_TABLET | Freq: Four times a day (QID) | ORAL | Status: DC
  Administered 2023-06-07 – 2023-06-08 (×5): 600 mg via ORAL
  Filled 2023-06-07 (×5): qty 1

## 2023-06-07 MED ORDER — BENZOCAINE-MENTHOL 20-0.5 % EX AERO: 1.0000 | INHALATION_SPRAY | CUTANEOUS | Status: DC | PRN

## 2023-06-07 MED ORDER — ONDANSETRON HCL 4 MG PO TABS: 4.0000 mg | ORAL_TABLET | ORAL | Status: DC | PRN

## 2023-06-07 MED ORDER — TETANUS-DIPHTH-ACELL PERTUSSIS 5-2.5-18.5 LF-MCG/0.5 IM SUSY: 0.5000 mL | PREFILLED_SYRINGE | Freq: Once | INTRAMUSCULAR | Status: DC

## 2023-06-07 MED ORDER — COCONUT OIL OIL: 1.0000 | TOPICAL_OIL | Status: DC | PRN

## 2023-06-07 MED ORDER — MEASLES, MUMPS & RUBELLA VAC IJ SOLR: 0.5000 mL | Freq: Once | INTRAMUSCULAR | Status: DC

## 2023-06-07 MED ORDER — WITCH HAZEL-GLYCERIN EX PADS: 1.0000 | MEDICATED_PAD | CUTANEOUS | Status: DC | PRN

## 2023-06-07 MED ORDER — ZOLPIDEM TARTRATE 5 MG PO TABS: 5.0000 mg | ORAL_TABLET | Freq: Every evening | ORAL | Status: DC | PRN

## 2023-06-07 MED ORDER — SIMETHICONE 80 MG PO CHEW: 80.0000 mg | CHEWABLE_TABLET | ORAL | Status: DC | PRN

## 2023-06-07 MED ORDER — DIPHENHYDRAMINE HCL 25 MG PO CAPS: 25.0000 mg | ORAL_CAPSULE | Freq: Four times a day (QID) | ORAL | Status: DC | PRN

## 2023-06-07 MED ORDER — DIBUCAINE (PERIANAL) 1 % EX OINT: 1.0000 | TOPICAL_OINTMENT | CUTANEOUS | Status: DC | PRN

## 2023-06-07 MED ORDER — ONDANSETRON HCL 4 MG/2ML IJ SOLN: 4.0000 mg | INTRAMUSCULAR | Status: DC | PRN

## 2023-06-07 MED ORDER — MEDROXYPROGESTERONE ACETATE 150 MG/ML IM SUSP: 150.0000 mg | Freq: Once | INTRAMUSCULAR | Status: DC

## 2023-06-07 MED ORDER — OXYCODONE HCL 5 MG PO TABS
5.0000 mg | ORAL_TABLET | ORAL | Status: DC | PRN
  Administered 2023-06-07: 5 mg via ORAL
  Filled 2023-06-07: qty 1

## 2023-06-07 MED ORDER — ACETAMINOPHEN 325 MG PO TABS: 650.0000 mg | ORAL_TABLET | ORAL | Status: DC | PRN

## 2023-06-07 NOTE — Anesthesia Postprocedure Evaluation (Signed)
Anesthesia Post Note  Patient: Natasha Beard  Procedure(s) Performed: AN AD HOC LABOR EPIDURAL     Patient location during evaluation: Mother Baby Anesthesia Type: Epidural Level of consciousness: awake and alert Pain management: pain level controlled Vital Signs Assessment: post-procedure vital signs reviewed and stable Respiratory status: spontaneous breathing, nonlabored ventilation and respiratory function stable Cardiovascular status: stable Postop Assessment: no headache, no backache and epidural receding Anesthetic complications: no   No notable events documented.  Last Vitals:  Vitals:   06/07/23 0501 06/07/23 0605  BP: 130/78 122/67  Pulse: 74 71  Resp: 16 16  Temp: 36.6 C 36.6 C  SpO2:      Last Pain:  Vitals:   06/07/23 0707  TempSrc:   PainSc: 0-No pain   Pain Goal:                   Torrion Witter

## 2023-06-07 NOTE — Progress Notes (Signed)
Patient ID: Natasha Beard, female   DOB: 04/22/1996, 27 y.o.   MRN: [id number]  Pushing since 0100 w good effort  BP 140/72, 136/86, 130/75 FHR baseline 120s, variables with pushing to 80s and beginning to last longer Ctx q 3 mins Vtx caput +2  IUP@40 .0wks Pushing Deeper FHR variables  Discussed w Dr Vergie Living the possibility of assessing for operative delivery; he is wrapping up a case in the OR, so will sit in throne position where baby seems to be the happiest and then he will come assess  Arabella Merles CNM 06/07/2023 2:20 AM

## 2023-06-07 NOTE — Social Work (Signed)
CSW received consult for an [place] Postnatal Depression Screen score, of 10. CSW met with MOB to offer support and complete assessment. CSW entered the room and observed MOB resting in bed holding the infant. CSW introduced self, CSW role and reason for visit\. MBO was agreeable to visit. CSW inquired about how MOB was feeling, MOB reported good just tired. CSW inquired about MOB mood over the past 7 days due to her EPDS score, MOB reported she was a little stressed preparing for the infants  arrival MOB reported she was also excited. CSW inquired about MOB MH hx, MOB reported she dealt with Anxiety and Depression in the past but none currently or throughout pregnancy. CSW provided education regarding the baby blues period vs. perinatal mood disorders, discussed treatment and gave resources for mental health follow up if concerns arise.  CSW recommends self-evaluation during the postpartum time period using the New Mom Checklist from Postpartum Progress and encouraged MOB to contact a medical professional if symptoms are noted at any time.  Mob identified her dad and step mom as her supports.   CSW provided review of Sudden Infant Death Syndrome (SIDS) precautions.  MOB reported she has all necessary items for the infant including a bassinet, crib and car seat. CSW identifies no further need for intervention and no barriers to discharge at this time.  Wende Neighbors, LCSWA Clinical Social Worker [phone]

## 2023-06-07 NOTE — Lactation Note (Signed)
This note was copied from a baby's chart. Lactation Consultation Note  Patient Name: Natasha Beard ZOXWR'[id number] Date: 06/07/2023 Age:28 hours  P1, [redacted]w[redacted]d  Initial visit to see P1 mother of term infant. "[name]". Baby was alert, calm and sucking on fingers upon arrival to room. Mother requesting assistance with latching. Baby placed in football hold and basic breastfeeding education taught. Mother was able to express colostrum. Baby latched well, wide gape, rhythmic sucking pattern and intermittent swallows noted. Mother taught breast compression when baby is feeding.  Mother encouraged to latch baby with feeding cues, place baby skin to skin if not latching, and call for assistance with breastfeeding as needed. Anticipate as baby approaches 24 hours of age, baby will breastfeed more often, 8-12 plus times in 24 hours and may "cluster feed".     Mom made aware of O/P services, breastfeeding support groups, and our phone # for post-discharge questions.     Maternal Data Has patient been taught Hand Expression?: Yes Does the patient have breastfeeding experience prior to this delivery?: No  Feeding Mother's Current Feeding Choice: Breast Milk  LATCH Score Latch: Grasps breast easily, tongue down, lips flanged, rhythmical sucking.  Audible Swallowing: A few with stimulation  Type of Nipple: Everted at rest and after stimulation  Comfort (Breast/Nipple): Soft / non-tender  Hold (Positioning): Assistance needed to correctly position infant at breast and maintain latch.  LATCH Score: 8   Lactation Tools Discussed/Used    Interventions Interventions: Breast feeding basics reviewed;Assisted with latch;Breast massage;Hand express;Breast compression;Support pillows;Position options;Education;LC Services brochure;CDC Guidelines for Breast Pump Cleaning (CDC guidelines for milk preparation and storage)  Discharge Pump: DEBP;Personal  Consult Status Consult Status: Follow-up Date:  06/08/23 Follow-up type: In-patient    Christella Hartigan M 06/07/2023, 5:06 PM

## 2023-06-07 NOTE — Discharge Summary (Signed)
Postpartum Discharge Summary    Patient Name: Natasha Beard DOB: [birth year] MRN: [id number]  Date of admission: 06/06/2023 Delivery date:06/07/2023 Delivering provider: [place] Date of discharge: 06/08/2023  Admitting diagnosis: Normal labor and delivery [O80] Intrauterine pregnancy: [redacted]w[redacted]d     Secondary diagnosis:  Principal Problem:   Normal labor and delivery Active Problems:   Positive GBS test  Additional problems: none    Discharge diagnosis: Term Pregnancy Delivered                                              Post partum procedures: DMPA prior to d/c   Augmentation: AROM Complications: None  Hospital course: Onset of Labor With Vaginal Delivery      27 y.o. yo G1P1001 at [redacted]w[redacted]d was admitted in Latent Labor on 06/06/2023. Labor course was complicated by deep FHR variables that became worse after pushing x 1h; vacuum was recommended and accepted.  Membrane Rupture Time/Date: 4:38 PM,06/06/2023  Delivery Method:Vaginal, Vacuum (Extractor) Operative Delivery:Device used:Kiwi Indication: Fetal indications Episiotomy: None Lacerations:  None Patient had a postpartum course that was complicated.  She is ambulating, tolerating a regular diet, passing flatus, and urinating well. Patient is discharged home in stable condition on 06/08/23.  Newborn Data: Birth date:06/07/2023 Birth time:2:50 AM Gender:Female Living status:Living Apgars:8 ,10  Weight:3210 g (7lb 1.2oz)  Magnesium Sulfate received: No BMZ received: No Rhophylac:N/A MMR:N/A T-DaP:Given prenatally Flu: Yes RSV Vaccine received: Yes Transfusion:No  Immunizations received: Immunization History  Administered Date(s) Administered   Influenza, Seasonal, Injecte, Preservative Fre 04/04/2023   Rsv, Bivalent, Protein Subunit Rsvpref,pf Verdis Frederickson) 05/16/2023   Tdap 03/07/2023    Physical exam  Vitals:   06/07/23 1400 06/07/23 1823 06/07/23 2124 06/08/23 0610  BP: 129/67 114/62 128/73 117/72  Pulse: 89 74  78 73  Resp: 16 18 18 18   Temp:  97.9 F (36.6 C) 98.1 F (36.7 C) 97.8 F (36.6 C)  TempSrc:  Oral Oral Oral  SpO2: 100% 100% 99% 100%   General: alert, cooperative, and no distress Lochia: appropriate Uterine Fundus: firm Incision: N/A DVT Evaluation: No evidence of DVT seen on physical exam. Labs: Lab Results  Component Value Date   WBC 10.8 (H) 06/06/2023   HGB 13.3 06/06/2023   HCT 39.2 06/06/2023   MCV 91.4 06/06/2023   PLT 162 06/06/2023      Latest Ref Rng & Units 02/07/2023    5:31 PM  CMP  Glucose 70 - 99 mg/dL 76   BUN 6 - 20 mg/dL 5   Creatinine 2.83 - 1.51 mg/dL 7.61   Sodium 607 - 371 mmol/L 133   Potassium 3.5 - 5.1 mmol/L 3.7   Chloride 98 - 111 mmol/L 104   CO2 22 - 32 mmol/L 19   Calcium 8.9 - 10.3 mg/dL 8.7   Total Protein 6.5 - 8.1 g/dL 6.7   Total Bilirubin 0.3 - 1.2 mg/dL 0.5   Alkaline Phos 38 - 126 U/L 61   AST 15 - 41 U/L 22   ALT 0 - 44 U/L 25    Edinburgh Score:    06/07/2023    7:07 AM  Edinburgh Postnatal Depression Scale Screening Tool  I have been able to laugh and see the funny side of things. 0  I have looked forward with enjoyment to things. 0  I have blamed myself unnecessarily when things went wrong. 2  I have been anxious or worried for no good reason. 2  I have felt scared or panicky for no good reason. 0  Things have been getting on top of me. 2  I have been so unhappy that I have had difficulty sleeping. 2  I have felt sad or miserable. 1  I have been so unhappy that I have been crying. 1  The thought of harming myself has occurred to me. 0  Edinburgh Postnatal Depression Scale Total 10   Edinburgh Postnatal Depression Scale Total: (!) 10   After visit meds:  Allergies as of 06/08/2023       Reactions   Apple Juice    Patient allergic to Apple   Chocolate    In moderation   Erythromycin Swelling        Medication List     TAKE these medications    acetaminophen 325 MG tablet Commonly known as:  Tylenol Take 2 tablets (650 mg total) by mouth every 4 (four) hours as needed (for pain scale < 4).   aspirin EC 81 MG tablet Take 1 tablet (81 mg total) by mouth daily. Swallow whole.   famotidine 20 MG tablet Commonly known as: PEPCID Take 1 tablet (20 mg total) by mouth 2 (two) times daily.   ibuprofen 600 MG tablet Commonly known as: ADVIL Take 1 tablet (600 mg total) by mouth every 6 (six) hours.   prenatal multivitamin Tabs tablet Take 1 tablet by mouth daily at 12 noon.   senna-docusate 8.6-50 MG tablet Commonly known as: Senokot-S Take 2 tablets by mouth 2 (two) times daily as needed for mild constipation.         Discharge home in stable condition Infant Feeding: Breast Infant Disposition:home with mother Discharge instruction: per After Visit Summary and Postpartum booklet. Activity: Advance as tolerated. Pelvic rest for 6 weeks.  Diet: routine diet Future Appointments: Future Appointments  Date Time Provider Department Center  06/27/2023  1:40 PM Donell Beers, FNP [facility] None  07/03/2023  9:00 AM Tobb, Lavona Mound, DO [facility] None  07/19/2023  9:35 AM Adam Phenix, MD [facility]   Follow up Visit:  Arabella Merles, CNM  P [facility] Admin Pool Please schedule this patient for Postpartum visit in: 6 weeks with the following provider: Any provider In-Person For C/S patients schedule nurse incision check in weeks 2 weeks: no Low risk pregnancy complicated by: none Delivery mode:  Vacuum Anticipated Birth Control:   PP Depo given PP Procedures needed: none Schedule Integrated BH visit: no   06/08/2023 Hessie Dibble, MD

## 2023-06-07 NOTE — Progress Notes (Signed)
Labor Progress Note Donice Kinnick is a 27 y.o. G1P0000 at [redacted]w[redacted]d presented for IOL due to contractions  S: Support persons at bedside. Comfortable with epidural  O:  BP 123/65   Pulse 79   Temp 98.9 F (37.2 C) (Axillary)   Resp 16   LMP 08/31/2022 (Exact Date) Comment: [redacted] weeks pregnant  SpO2 100%  EFM: 130bpm/mod variability/15 x15 accels/variable decels  CVE: Dilation: 10 Dilation Complete Date: 06/07/23 Dilation Complete Time: 2318 Effacement (%): 100 Station: -1, 0 Presentation: Vertex Exam by:: Brynda Greathouse, RN   A&P: 27 y.o. G1P0000 [redacted]w[redacted]d presenting for IOL due to contractions #Labor: Progressing well. Baby with occasional variables with position changes. Occasional pressure with ctx. #Pain: Epidural #FWB: Cat 2 #GBS positive Continue with PCN  Anticipate NSVD  Gwenlyn Perking, MD 12:07 AM

## 2023-06-08 MED ORDER — IBUPROFEN 600 MG PO TABS: 600.0000 mg | ORAL_TABLET | Freq: Four times a day (QID) | ORAL | 0 refills | Status: DC

## 2023-06-08 MED ORDER — ACETAMINOPHEN 325 MG PO TABS: 650.0000 mg | ORAL_TABLET | ORAL | 0 refills | Status: DC | PRN

## 2023-06-08 MED ORDER — SENNOSIDES-DOCUSATE SODIUM 8.6-50 MG PO TABS: 2.0000 | ORAL_TABLET | Freq: Two times a day (BID) | ORAL | 0 refills | Status: DC | PRN

## 2023-06-08 NOTE — Lactation Note (Signed)
This note was copied from a baby's chart. Lactation Consultation Note  Patient Name: Natasha Beard ZOXWR'U Date: 06/08/2023 Age:28 hours  Reason for consult: Follow-up assessment;Primapara;1st time breastfeeding;Term;Breastfeeding assistance  P1, [redacted]w[redacted]d, 6% weight loss  Observed mother breastfeeding. Baby is latched well. Encouraged mother to bring baby closer to the breast for better milk transfer and avoid nipple discomfort. Reviewed hand expression and mother having an increased amount of colostrum. Reviewed breast compression during the feeding to increase intake.   Mother hopeful for discharge today. Baby being circumcised after this feeding. Discussed feeding baby 8-12 times in 24 hours. Informed baby may be sleepy following his circ but should resume frequent feedings.    Feeding Mother's Current Feeding Choice: Breast Milk  LATCH Score Latch: Grasps breast easily, tongue down, lips flanged, rhythmical sucking.  Audible Swallowing: A few with stimulation  Type of Nipple: Everted at rest and after stimulation  Comfort (Breast/Nipple): Soft / non-tender  Hold (Positioning): No assistance needed to correctly position infant at breast.  LATCH Score: 9   Lactation Tools Discussed/Used    Interventions Interventions: Breast feeding basics reviewed;Skin to skin;Breast compression;Support pillows;Hand express;Education  Discharge Discharge Education: Engorgement and breast care;Warning signs for feeding baby  Consult Status Consult Status: Complete Date: 06/08/23    Omar Person 06/08/2023, 1:25 PM

## 2023-06-08 NOTE — Progress Notes (Addendum)
POSTPARTUM PROGRESS NOTE  Post Partum Day #1  Subjective:  Natasha Beard is a 27 y.o. B2W4132 s/p VAVD at 12/19.  No acute events overnight.  Pt denies problems with ambulating, voiding or po intake.  She denies nausea or vomiting.  Pain is well controlled.  She has had flatus. She has had bowel movement.  Lochia Minimal.   Objective: Blood pressure 114/72, pulse (!) 57, temperature 98.3 F (36.8 C), temperature source Oral, resp. rate 19, height 5\' 6"  (1.676 m), weight 99.3 kg, last menstrual period 04/21/2023, SpO2 100%.  Physical Exam:  General: alert, cooperative and no distress Chest: no respiratory distress Heart:regular rate, distal pulses intact Abdomen: soft, nontender,  Uterine Fundus: firm, appropriately tender DVT Evaluation: No calf swelling or tenderness Extremities: No edema Skin: warm, dry;  Recent Labs    05/06/23 0908 05/07/23 0856  HGB 13.3 14.0  HCT 40.4 42.2    Assessment/Plan: Natasha Beard is a 27 y.o. G4W1027 s/p VAVD at 12/19 at 0250  PPD#1 - Doing well Contraception: Depo  Feeding: Breast Dispo: Plan for discharge later today.   LOS: 0 days   [name],MD 05/07/2023, 9:58 AM     Evaluation and management procedures were performed by the [facility] Medicine Resident under my supervision. I was immediately available for direct supervision, assistance and direction throughout this encounter.  I also confirm that I have verified the information documented in the resident's note, and that I have also personally reperformed the pertinent components of the physical exam and all of the medical decision making activities.  I have also made any necessary editorial changes.   Mittie Bodo, MD Family Medicine - Obstetrics Fellow

## 2023-06-08 NOTE — Progress Notes (Signed)
   PRENATAL VISIT NOTE  Subjective:  Natasha Beard is a 27 y.o. G1P1001 at [redacted]w[redacted]d being seen today for ongoing prenatal care.  She is currently monitored for the following issues for this low-risk pregnancy and has Supervision of low-risk pregnancy; Palpitations; Positive GBS test; and Normal labor and delivery on their problem list.  Patient reports contractions since 0330 but no bloody show or loss of fluid/mucus plug .  Contractions: Regular. Vag. Bleeding: Bloody Show.  Movement: Present. Denies leaking of fluid.   The following portions of the patient's history were reviewed and updated as appropriate: allergies, current medications, past family history, past medical history, past social history, past surgical history and problem list.   Objective:   Vitals:   06/06/23 0926  BP: 138/77  Pulse: 85  Weight: 217 lb 3.2 oz (98.5 kg)    Fetal Status: Fetal Heart Rate (bpm): 130 Fundal Height: 39 cm Movement: Present  Presentation: Vertex  General:  Alert, oriented and cooperative. Patient is in no acute distress.  Skin: Skin is warm and dry. No rash noted.   Cardiovascular: Normal heart rate noted  Respiratory: Normal respiratory effort, no problems with respiration noted  Abdomen: Soft, gravid, appropriate for gestational age.  Pain/Pressure: Present     Pelvic: Cervical exam performed in the presence of a chaperone Dilation: 3.5 Effacement (%): 80 Station: -3  Extremities: Normal range of motion.  Edema: Mild pitting, slight indentation  Mental Status: Normal mood and affect. Normal behavior. Normal judgment and thought content.   Assessment and Plan:  Pregnancy: G1P1001 at [redacted]w[redacted]d 1. Encounter for supervision of low-risk pregnancy in third trimester (Primary) - Doing well, looks to be transitioning into active labor  2. [redacted] weeks gestation of pregnancy - Routine OB care  - Sent to L&D for direct admit, report called to Dr. Crissie Reese with secure message sent to labor team.  Term labor  symptoms and general obstetric precautions including but not limited to vaginal bleeding, contractions, leaking of fluid and fetal movement were reviewed in detail with the patient. Please refer to After Visit Summary for other counseling recommendations.   Return in about 5 weeks (around 07/11/2023) for IN-PERSON, PP.  Future Appointments  Date Time Provider Department Center  06/27/2023  1:40 PM Donell Beers, FNP [facility] None  07/03/2023  9:00 AM Tobb, Kardie, DO [facility] None  07/18/2023  1:35 PM Bernerd Limbo, CNM [facility]    Bernerd Limbo, CNM

## 2023-06-14 ENCOUNTER — Telehealth (HOSPITAL_COMMUNITY): Payer: Self-pay | Admitting: *Deleted

## 2023-06-14 ENCOUNTER — Encounter: Payer: Self-pay | Admitting: Certified Nurse Midwife

## 2023-06-14 ENCOUNTER — Other Ambulatory Visit: Payer: Commercial Managed Care - HMO

## 2023-06-14 DIAGNOSIS — Z1331 Encounter for screening for depression: Secondary | ICD-10-CM

## 2023-06-14 NOTE — Telephone Encounter (Signed)
06/14/2023  Name: Natasha Beard MRN: [id number] DOB: 11-28-1995  Reason for Call:  Transition of Care Hospital Discharge Call  Contact Status: Patient Contact Status: Complete  Language assistant needed: Interpreter Mode: Interpreter Not Needed        Follow-Up Questions: Do You Have Any Concerns About Your Health As You Heal From Delivery?: Yes What Concerns Do You Have About Your Health?: Patient has noted that she is having blood clots more frequently and that they are larger than before.  Denies saturating pads or clots being egg sized or larger.  We reviewed normals for lochia and parameters for calling provider.  She has also noted having swelling in her feet.  Denies headaches, blurred vision, seeing spots or RUQ pain.  Endorses she has been on her feet since delivery.  Has a BP cuff at home and knows how to enter values into [employer].  We discussed the need for her to rest more and keep her feet elevated while also drinking plenty of fluids.  Encouraged patient to take BP daily and enter the results into [employer].  Discussed symptoms of preeclampsia and need for her to call provider if she experiences any. Do You Have Any Concerns About Your Infants Health?: No  Edinburgh Postnatal Depression Scale:  In the Past 7 Days: I have been able to laugh and see the funny side of things.: As much as I always could I have looked forward with enjoyment to things.: As much as I ever did I have blamed myself unnecessarily when things went wrong.: Yes, some of the time I have been anxious or worried for no good reason.: Yes, sometimes I have felt scared or panicky for no good reason.: No, not much Things have been getting on top of me.: Yes, sometimes I haven't been coping as well as usual I have been so unhappy that I have had difficulty sleeping.: Not at all I have felt sad or miserable.: Not very often I have been so unhappy that I have been crying.: Only occasionally The thought of  harming myself has occurred to me.: Never Edinburgh Postnatal Depression Scale Total: 9  PHQ2-9 Depression Scale:     Discharge Follow-up: Edinburgh score requires follow up?: No (however score was 9 and patient feels she is having some struggles, would like referral to [facility]) Patient was advised of the following resources:: Support Group, Breastfeeding Support Group  Post-discharge interventions: Reviewed Newborn Safe Sleep Practices Maternal Mental Health Resources provided Placed [facility] referral in patient's chart  Salena Saner, RN 06/14/2023 17:28

## 2023-06-19 ENCOUNTER — Telehealth: Payer: Self-pay | Admitting: Clinical

## 2023-06-19 NOTE — Telephone Encounter (Signed)
Attempt call regarding referral; Left HIPPA-compliant message to call back Mikle Sternberg from [facility] at  [phone] (Ashelyn Mccravy's office).    

## 2023-06-27 ENCOUNTER — Ambulatory Visit (INDEPENDENT_AMBULATORY_CARE_PROVIDER_SITE_OTHER): Payer: Commercial Managed Care - HMO | Admitting: Nurse Practitioner

## 2023-06-27 VITALS — BP 130/74 | HR 84 | Temp 97.2°F | Ht 67.0 in | Wt 191.0 lb

## 2023-06-27 DIAGNOSIS — I4729 Other ventricular tachycardia: Secondary | ICD-10-CM | POA: Diagnosis not present

## 2023-06-27 DIAGNOSIS — K5900 Constipation, unspecified: Secondary | ICD-10-CM | POA: Diagnosis not present

## 2023-06-27 DIAGNOSIS — Z8659 Personal history of other mental and behavioral disorders: Secondary | ICD-10-CM | POA: Diagnosis not present

## 2023-06-27 NOTE — Assessment & Plan Note (Signed)
 Patient denies palpitations, chest pain, shortness of breath, dizziness Heart rate was 84 in the office today

## 2023-06-27 NOTE — Patient Instructions (Addendum)
 For constipation it is important that you have an adequate intake of fruit and vegetables daily, at least 3 servings of each, as well as water intake of at least 64 ounces daily and regular exercise.  It is important that you exercise regularly at least 30 minutes 5 times a week as tolerated  Think about what you will eat, plan ahead. Choose  clean, green, fresh or frozen over canned, processed or packaged foods which are more sugary, salty and fatty. 70 to 75% of food eaten should be vegetables and fruit. Three meals at set times with snacks allowed between meals, but they must be fruit or vegetables. Aim to eat over a 12 hour period , example 7 am to 7 pm, and STOP after  your last meal of the day. Drink water,generally about 64 ounces per day, no other drink is as healthy. Fruit juice is best enjoyed in a healthy way, by EATING the fruit.  Thanks for choosing [facility] we consider it a privelige to serve you.

## 2023-06-27 NOTE — Assessment & Plan Note (Signed)
 Continue Senokot 8.6/50 mg 2 tablets 2 times daily as needed Patient encouraged to drink at least 64 ounces of water daily and increase intake of fiber to prevent constipation

## 2023-06-27 NOTE — Progress Notes (Signed)
 New Patient Office Visit  Subjective:  Patient ID: Natasha Beard, female    DOB: 12-18-1995  Age: 28 y.o. MRN: [id number]  CC:  Chief Complaint  Patient presents with   Establish Care    HPI Natasha Beard is a 28 y.o. female  has a past medical history of Allergy , Anxiety, Arthritis, and Depression.  Patient presents to establish care for her chronic medical conditions . She recently delivered a baby on 06/07/2023.  Stated that she has been enjoying motherhood so far she lives with her boyfriend that has been very supportive, her parents have also been very helpful.  Stated that she had depression in the past but not currently just feels exhausted sometimes. States that she is doing well generally  She currently denies fever, chest pain shortness of breath abdominal pain nausea vomiting      Past Medical History:  Diagnosis Date   Allergy     Apples, milk chocolate   Anxiety    Arthritis    Depression     Past Surgical History:  Procedure Laterality Date   NO PAST SURGERIES      Family History  Problem Relation Age of Onset   Healthy Mother    Anemia Mother    Diabetes Father    Seizures Father    Breast cancer Maternal Aunt    Breast cancer Maternal Grandmother     Social History   Socioeconomic History   Marital status: Single    Spouse name: Not on file   Number of children: 1   Years of education: Not on file   Highest education level: GED or equivalent  Occupational History   Not on file  Tobacco Use   Smoking status: Never   Smokeless tobacco: Never  Vaping Use   Vaping status: Never Used  Substance and Sexual Activity   Alcohol use: Not Currently   Drug use: Not Currently    Types: Marijuana    Comment: +UDS 12/11/22 THC   Sexual activity: Yes    Birth control/protection: None  Other Topics Concern   Not on file  Social History Narrative   Lives with her boyfriend    Social Drivers of Health   Financial Resource Strain: Medium Risk  (06/27/2023)   Overall Financial Resource Strain (CARDIA)    Difficulty of Paying Living Expenses: Somewhat hard  Food Insecurity: Food Insecurity Present (06/27/2023)   Hunger Vital Sign    Worried About Running Out of Food in the Last Year: Never true    Ran Out of Food in the Last Year: Sometimes true  Transportation Needs: Unmet Transportation Needs (06/27/2023)   PRAPARE - Transportation    Lack of Transportation (Medical): No    Lack of Transportation (Non-Medical): Yes  Physical Activity: Unknown (06/27/2023)   Exercise Vital Sign    Days of Exercise per Week: 0 days    Minutes of Exercise per Session: Not on file  Stress: Stress Concern Present (06/27/2023)   [employer] of Occupational Health - Occupational Stress Questionnaire    Feeling of Stress : To some extent  Social Connections: Socially Isolated (06/27/2023)   Social Connection and Isolation Panel [NHANES]    Frequency of Communication with Friends and Family: More than three times a week    Frequency of Social Gatherings with Friends and Family: Patient declined    Attends Religious Services: Never    Database Administrator or Organizations: No    Attends Banker Meetings: Not on file  Marital Status: Never married  Intimate Partner Violence: Not At Risk (06/07/2023)   Humiliation, Afraid, Rape, and Kick questionnaire    Fear of Current or Ex-Partner: No    Emotionally Abused: No    Physically Abused: No    Sexually Abused: No    ROS Review of Systems  Objective:   Today's Vitals: BP 130/74   Pulse 84   Temp (!) 97.2 F (36.2 C)   Ht 5' 7 (1.702 m)   Wt 191 lb (86.6 kg)   LMP 08/31/2022 (Exact Date) Comment: [redacted] weeks pregnant  SpO2 94%   BMI 29.91 kg/m   Physical Exam Vitals and nursing note reviewed.  Constitutional:      General: She is not in acute distress.    Appearance: Normal appearance. She is not ill-appearing, toxic-appearing or diaphoretic.  HENT:     Mouth/Throat:      Mouth: Mucous membranes are moist.     Pharynx: Oropharynx is clear. No oropharyngeal exudate or posterior oropharyngeal erythema.  Eyes:     General: No scleral icterus.       Right eye: No discharge.        Left eye: No discharge.     Extraocular Movements: Extraocular movements intact.     Conjunctiva/sclera: Conjunctivae normal.  Cardiovascular:     Rate and Rhythm: Normal rate and regular rhythm.     Pulses: Normal pulses.     Heart sounds: Normal heart sounds. No murmur heard.    No friction rub. No gallop.  Pulmonary:     Effort: Pulmonary effort is normal. No respiratory distress.     Breath sounds: Normal breath sounds. No stridor. No wheezing, rhonchi or rales.  Chest:     Chest wall: No tenderness.  Abdominal:     General: There is no distension.     Palpations: Abdomen is soft.     Tenderness: There is no abdominal tenderness. There is no right CVA tenderness, left CVA tenderness or guarding.  Musculoskeletal:        General: No swelling, tenderness, deformity or signs of injury.     Right lower leg: No edema.     Left lower leg: No edema.  Skin:    General: Skin is warm and dry.     Capillary Refill: Capillary refill takes less than 2 seconds.     Coloration: Skin is not jaundiced or pale.     Findings: No bruising, erythema or lesion.  Neurological:     Mental Status: She is alert and oriented to person, place, and time.     Motor: No weakness.     Coordination: Coordination normal.     Gait: Gait normal.  Psychiatric:        Mood and Affect: Mood normal.        Behavior: Behavior normal.        Thought Content: Thought content normal.        Judgment: Judgment normal.     Assessment & Plan:   Problem List Items Addressed This Visit       Cardiovascular and Mediastinum   NSVT (nonsustained ventricular tachycardia) (HCC)   Patient denies palpitations, chest pain, shortness of breath, dizziness Heart rate was 84 in the office today        Other    Constipation - Primary   Continue Senokot 8.6/50 mg 2 tablets 2 times daily as needed Patient encouraged to drink at least 64 ounces of water daily and increase intake of  fiber to prevent constipation      History of depression   Currently denies depression but sometimes feels exhausted, recently had a a baby Patient encouraged to drink at least 64 ounces of water daily to maintain hydration. Importance of adequate rest also discussed       Outpatient Encounter Medications as of 06/27/2023  Medication Sig   acetaminophen  (TYLENOL ) 325 MG tablet Take 2 tablets (650 mg total) by mouth every 4 (four) hours as needed (for pain scale < 4).   Prenatal Vit-Fe Fumarate-FA (PRENATAL MULTIVITAMIN) TABS tablet Take 1 tablet by mouth daily at 12 noon.   senna-docusate (SENOKOT-S) 8.6-50 MG tablet Take 2 tablets by mouth 2 (two) times daily as needed for mild constipation.   famotidine  (PEPCID ) 20 MG tablet Take 1 tablet (20 mg total) by mouth 2 (two) times daily. (Patient not taking: Reported on 06/27/2023)   ibuprofen  (ADVIL ) 600 MG tablet Take 1 tablet (600 mg total) by mouth every 6 (six) hours. (Patient not taking: Reported on 06/27/2023)   No facility-administered encounter medications on file as of 06/27/2023.    Follow-up: Return in about 1 year (around 06/26/2024) for CPE.   Copper Kirtley R Tanga Gloor, FNP

## 2023-06-27 NOTE — Assessment & Plan Note (Addendum)
 Currently denies depression but sometimes feels exhausted, recently had a a baby Patient encouraged to drink at least 64 ounces of water daily to maintain hydration. Importance of adequate rest also discussed

## 2023-06-30 ENCOUNTER — Other Ambulatory Visit: Payer: Self-pay

## 2023-07-03 ENCOUNTER — Ambulatory Visit: Payer: Commercial Managed Care - HMO | Attending: Cardiology | Admitting: Cardiology

## 2023-07-03 ENCOUNTER — Encounter: Payer: Self-pay | Admitting: Cardiology

## 2023-07-03 VITALS — BP 168/91 | HR 84 | Ht 67.0 in | Wt 192.0 lb

## 2023-07-03 DIAGNOSIS — I10 Essential (primary) hypertension: Secondary | ICD-10-CM

## 2023-07-03 MED ORDER — POTASSIUM CHLORIDE CRYS ER 20 MEQ PO TBCR: 20.0000 meq | EXTENDED_RELEASE_TABLET | Freq: Every day | ORAL | 0 refills | Status: DC

## 2023-07-03 MED ORDER — AMLODIPINE BESYLATE 5 MG PO TABS: 5.0000 mg | ORAL_TABLET | Freq: Every day | ORAL | 3 refills | Status: DC

## 2023-07-03 MED ORDER — FUROSEMIDE 40 MG PO TABS: 40.0000 mg | ORAL_TABLET | Freq: Every day | ORAL | 0 refills | Status: DC

## 2023-07-03 NOTE — Progress Notes (Addendum)
 Cardio-Obstetrics Clinic  Follow Up Note   Date:  07/04/2023   ID:  Natasha Beard, DOB May 17, 1996, MRN [id number]  PCP:  Paseda, Folashade R, FNP   [facility] Providers Cardiologist:  Jerryl Morin, DO  Electrophysiologist:  None        Referring MD: No ref. provider found   Chief Complaint:  I am have been having fluctuating blood pressure She is at home. I am in the office.   Virtual Visit via Video  Note . I connected with the patient today by a   video enabled telemedicine application and verified that I am speaking with the correct person using two identifiers.   History of Present Illness:    Natasha Beard is a 28 y.o. female [G1P1001] who returns for follow up of post-partum cardiovascular care.   She presents with concerns of fluctuating blood pressure. She reports that her blood pressure was initially high after leaving the hospital, then decreased, and has recently increased again. She also notes that she experienced swelling in her feet, which has since resolved. The patient is currently breastfeeding and adjusting to the demands of being a new mother. She expresses gratitude for the current consultation and appears motivated to manage her health condition.  Prior CV Studies Reviewed: The following studies were reviewed today: None   Past Medical History:  Diagnosis Date   Allergy     Apples, milk chocolate   Anxiety    Arthritis    Depression     Past Surgical History:  Procedure Laterality Date   NO PAST SURGERIES        OB History     Gravida  1   Para  1   Term  1   Preterm  0   AB  0   Living  1      SAB  0   IAB  0   Ectopic  0   Multiple  0   Live Births  1               Current Medications: Current Meds  Medication Sig   acetaminophen  (TYLENOL ) 325 MG tablet Take 2 tablets (650 mg total) by mouth every 4 (four) hours as needed (for pain scale < 4).   amLODipine  (NORVASC ) 5 MG tablet Take 1 tablet (5 mg total)  by mouth daily.   furosemide  (LASIX ) 40 MG tablet Take 1 tablet (40 mg total) by mouth daily for 3 days.   ibuprofen  (ADVIL ) 600 MG tablet Take 1 tablet (600 mg total) by mouth every 6 (six) hours.   potassium chloride  SA (KLOR-CON  M20) 20 MEQ tablet Take 1 tablet (20 mEq total) by mouth daily for 3 days.   Prenatal Vit-Fe Fumarate-FA (PRENATAL MULTIVITAMIN) TABS tablet Take 1 tablet by mouth daily at 12 noon.     Allergies:   Apple juice, Chocolate, and Erythromycin    Social History   Socioeconomic History   Marital status: Single    Spouse name: Not on file   Number of children: 1   Years of education: Not on file   Highest education level: GED or equivalent  Occupational History   Not on file  Tobacco Use   Smoking status: Never   Smokeless tobacco: Never  Vaping Use   Vaping status: Never Used  Substance and Sexual Activity   Alcohol use: Not Currently   Drug use: Not Currently    Types: Marijuana    Comment: +UDS 12/11/22 THC   Sexual activity:  Yes    Birth control/protection: None  Other Topics Concern   Not on file  Social History Narrative   Lives with her boyfriend    Social Drivers of Health   Financial Resource Strain: Medium Risk (06/27/2023)   Overall Financial Resource Strain (CARDIA)    Difficulty of Paying Living Expenses: Somewhat hard  Food Insecurity: Food Insecurity Present (06/27/2023)   Hunger Vital Sign    Worried About Running Out of Food in the Last Year: Never true    Ran Out of Food in the Last Year: Sometimes true  Transportation Needs: Unmet Transportation Needs (06/27/2023)   PRAPARE - Transportation    Lack of Transportation (Medical): No    Lack of Transportation (Non-Medical): Yes  Physical Activity: Unknown (06/27/2023)   Exercise Vital Sign    Days of Exercise per Week: 0 days    Minutes of Exercise per Session: Not on file  Stress: Stress Concern Present (06/27/2023)   [employer] of Occupational Health - Occupational Stress  Questionnaire    Feeling of Stress : To some extent  Social Connections: Socially Isolated (06/27/2023)   Social Connection and Isolation Panel [NHANES]    Frequency of Communication with Friends and Family: More than three times a week    Frequency of Social Gatherings with Friends and Family: Patient declined    Attends Religious Services: Never    Database administrator or Organizations: No    Attends Engineer, structural: Not on file    Marital Status: Never married      Family History  Problem Relation Age of Onset   Healthy Mother    Anemia Mother    Diabetes Father    Seizures Father    Breast cancer Maternal Aunt    Breast cancer Maternal Grandmother       ROS:   Please see the history of present illness.     All other systems reviewed and are negative.   Labs/EKG Reviewed:    EKG:   EKG was not  ordered today.    Recent Labs: 02/07/2023: ALT 25; BUN 5; Creatinine, Ser 0.41; Potassium 3.7; Sodium 133 06/06/2023: Hemoglobin 13.3; Platelets 162   Recent Lipid Panel No results found for: CHOL, TRIG, HDL, CHOLHDL, LDLCALC, LDLDIRECT  Physical Exam:    VS:  BP (!) 168/91 (BP Location: Right Arm, Patient Position: Sitting, Cuff Size: Normal)   Pulse 84   Ht 5' 7 (1.702 m)   Wt 192 lb (87.1 kg)   LMP 08/31/2022 (Exact Date) Comment: [redacted] weeks pregnant  BMI 30.07 kg/m     Wt Readings from Last 3 Encounters:  07/03/23 192 lb (87.1 kg)  06/27/23 191 lb (86.6 kg)  06/06/23 217 lb 3.2 oz (98.5 kg)      Risk Assessment/Risk Calculators:                 ASSESSMENT & PLAN:    Postpartum Hypertension New onset hypertension in the postpartum period with fluctuating blood pressure readings. Discussed the risk of untreated hypertension and the potential need for long-term management if blood pressure does not normalize within the postpartum period. -Start Lasix  for 3 days to manage fluid overload. -Start Amlodipine  for blood pressure  control -Schedule follow-up appointment in 1 week with either the physician or the pharmacist  Postpartum Care Patient is breastfeeding and adjusting to new motherhood. -Encourage continued breastfeeding and self-care. -Ensure patient understands the importance of follow-up appointments for her health.  Total time 12 min  Patient Instructions  Medication Instructions:  Your physician has recommended you make the following change in your medication:  For 3 days:  Lasix  40 mg once daily  For 3 days:  Potassium 20 mEq once daily  START: Amlodipine  5 mg once daily  *If you need a refill on your cardiac medications before your next appointment, please call your pharmacy*  Follow-Up: At [facility], you and your health needs are our priority.  As part of our continuing mission to provide you with exceptional heart care, we have created designated Provider Care Teams.  These Care Teams include your primary Cardiologist (physician) and Advanced Practice Providers (APPs -  Physician Assistants and Nurse Practitioners) who all work together to provide you with the care you need, when you need it.  Your next appointment:   4 week(s)  Provider:   Karema Tocci, DO     Other Instructions      Dispo:  No follow-ups on file.   Medication Adjustments/Labs and Tests Ordered: Current medicines are reviewed at length with the patient today.  Concerns regarding medicines are outlined above.  Tests Ordered: Orders Placed This Encounter  Procedures   AMB Referral to Heartcare Pharm-D   Medication Changes: Meds ordered this encounter  Medications   furosemide  (LASIX ) 40 MG tablet    Sig: Take 1 tablet (40 mg total) by mouth daily for 3 days.    Dispense:  3 tablet    Refill:  0   potassium chloride  SA (KLOR-CON  M20) 20 MEQ tablet    Sig: Take 1 tablet (20 mEq total) by mouth daily for 3 days.    Dispense:  3 tablet    Refill:  0   amLODipine  (NORVASC ) 5 MG tablet    Sig: Take 1  tablet (5 mg total) by mouth daily.    Dispense:  90 tablet    Refill:  3

## 2023-07-03 NOTE — Patient Instructions (Addendum)
 Medication Instructions:  Your physician has recommended you make the following change in your medication:  For 3 days:  Lasix  40 mg once daily  For 3 days:  Potassium 20 mEq once daily  START: Amlodipine  5 mg once daily  *If you need a refill on your cardiac medications before your next appointment, please call your pharmacy*  Follow-Up: At [facility], you and your health needs are our priority.  As part of our continuing mission to provide you with exceptional heart care, we have created designated Provider Care Teams.  These Care Teams include your primary Cardiologist (physician) and Advanced Practice Providers (APPs -  Physician Assistants and Nurse Practitioners) who all work together to provide you with the care you need, when you need it.  Your next appointment:   4 week(s)  Provider:   Kardie Tobb, DO     Other Instructions

## 2023-07-13 ENCOUNTER — Ambulatory Visit (INDEPENDENT_AMBULATORY_CARE_PROVIDER_SITE_OTHER): Payer: Commercial Managed Care - HMO | Admitting: Pharmacist

## 2023-07-13 ENCOUNTER — Encounter: Payer: Self-pay | Admitting: Pharmacist

## 2023-07-13 VITALS — BP 127/74 | HR 93

## 2023-07-13 DIAGNOSIS — F418 Other specified anxiety disorders: Secondary | ICD-10-CM | POA: Insufficient documentation

## 2023-07-13 DIAGNOSIS — G43909 Migraine, unspecified, not intractable, without status migrainosus: Secondary | ICD-10-CM

## 2023-07-13 DIAGNOSIS — I10 Essential (primary) hypertension: Secondary | ICD-10-CM

## 2023-07-13 DIAGNOSIS — L309 Dermatitis, unspecified: Secondary | ICD-10-CM | POA: Insufficient documentation

## 2023-07-13 HISTORY — DX: Migraine, unspecified, not intractable, without status migrainosus: G43.909

## 2023-07-13 NOTE — Progress Notes (Signed)
Patient ID: Natasha Beard                 DOB: [birth year]                      MRN: [id number]     HPI: Natasha Beard is a 28 y.o. female followed by Dr Servando Salina in cardio OB clinic. Delivered female on 06/07/23.  Presents today in good spirits. Reports blood pressure has been up and down since delivery. Is not sure if amlodipine is helping. Reports on adverse effects.  Has stress from being a new mother but denies anxiety/worry. Not sleeping much but that is due to baby in room.  Appetite is good. Has been eating more salty pork products which she says she did not eat pre pregnancy. Diet consists of fruits/vegetables, chicken, steak, ground [place].  Does not drink coffee but occasionally has had a soda with caffeine.   Is currently breastfeeding.  Current HTN meds:  Amlodipine 5mg    Wt Readings from Last 3 Encounters:  07/03/23 192 lb (87.1 kg)  06/27/23 191 lb (86.6 kg)  06/06/23 217 lb 3.2 oz (98.5 kg)   BP Readings from Last 3 Encounters:  07/03/23 (!) 168/91  06/27/23 130/74  06/08/23 117/72   Pulse Readings from Last 3 Encounters:  07/03/23 84  06/27/23 84  06/08/23 73    Renal function: CrCl cannot be calculated (Patient's most recent lab result is older than the maximum 21 days allowed.).  Past Medical History:  Diagnosis Date   Allergy    Apples, milk chocolate   Anxiety    Arthritis    Depression     Current Outpatient Medications on File Prior to Visit  Medication Sig Dispense Refill   Prenatal Vit-Fe Phos-FA-Omega (VITAFOL GUMMIES) 3.33-0.333-34.8 MG CHEW Chew 3 tablets by mouth daily.     acetaminophen (TYLENOL) 325 MG tablet Take 2 tablets (650 mg total) by mouth every 4 (four) hours as needed (for pain scale < 4). 30 tablet 0   amLODipine (NORVASC) 5 MG tablet Take 1 tablet (5 mg total) by mouth daily. 90 tablet 3   famotidine (PEPCID) 20 MG tablet TAKE 1 TABLET BY MOUTH TWICE A DAY (Patient not taking: Reported on 07/03/2023) 60 tablet 3   furosemide (LASIX)  40 MG tablet Take 1 tablet (40 mg total) by mouth daily for 3 days. 3 tablet 0   ibuprofen (ADVIL) 600 MG tablet Take 1 tablet (600 mg total) by mouth every 6 (six) hours. 30 tablet 0   potassium chloride SA (KLOR-CON M20) 20 MEQ tablet Take 1 tablet (20 mEq total) by mouth daily for 3 days. 3 tablet 0   Prenatal Vit-Fe Fumarate-FA (PRENATAL MULTIVITAMIN) TABS tablet Take 1 tablet by mouth daily at 12 noon.     senna-docusate (SENOKOT-S) 8.6-50 MG tablet Take 2 tablets by mouth 2 (two) times daily as needed for mild constipation. (Patient not taking: Reported on 07/03/2023) 60 tablet 0   No current facility-administered medications on file prior to visit.    Allergies  Allergen Reactions   Apple Juice     Patient allergic to Apple   Chocolate     In moderation   Erythromycin Swelling     Assessment/Plan:  1. Hypertension -  Blood pressure checked twice in room today: 125/79 and 127/74. No med changes needed. Will continue amlodipine 5mg  daily and have patient report back weekend blood pressure readings on Monday.  Continue amlodipine 5mg  daily  Thayer Ohm  Aadyn Buchheit, PharmD, BCACP, CDCES, CPP [address] [place], [place], [address] Phone: [phone], Fax: [phone]

## 2023-07-13 NOTE — Patient Instructions (Signed)
Marland Kitchen

## 2023-07-18 ENCOUNTER — Other Ambulatory Visit: Payer: Self-pay

## 2023-07-18 ENCOUNTER — Ambulatory Visit (INDEPENDENT_AMBULATORY_CARE_PROVIDER_SITE_OTHER): Payer: Commercial Managed Care - HMO | Admitting: Certified Nurse Midwife

## 2023-07-18 ENCOUNTER — Encounter: Payer: Self-pay | Admitting: Certified Nurse Midwife

## 2023-07-18 DIAGNOSIS — Z30013 Encounter for initial prescription of injectable contraceptive: Secondary | ICD-10-CM

## 2023-07-18 DIAGNOSIS — Z30019 Encounter for initial prescription of contraceptives, unspecified: Secondary | ICD-10-CM

## 2023-07-18 DIAGNOSIS — O165 Unspecified maternal hypertension, complicating the puerperium: Secondary | ICD-10-CM

## 2023-07-18 DIAGNOSIS — F418 Other specified anxiety disorders: Secondary | ICD-10-CM | POA: Diagnosis not present

## 2023-07-18 MED ORDER — MEDROXYPROGESTERONE ACETATE 150 MG/ML IM SUSY
150.0000 mg | PREFILLED_SYRINGE | Freq: Once | INTRAMUSCULAR | Status: AC
  Administered 2023-07-18: 150 mg via INTRAMUSCULAR

## 2023-07-18 NOTE — Progress Notes (Signed)
Postpartum Visit Note  Natasha Beard is a 28 y.o. G20P1001 female who presents for a postpartum visit. She is 5 weeks 5 days postpartum following a normal spontaneous vaginal delivery.  I have fully reviewed the prenatal and intrapartum course. The delivery was at 40 gestational weeks.  Anesthesia: epidural. Postpartum course has been uncomplicated. Baby is doing well. Baby is feeding by breast. Bleeding no bleeding. Bowel function is normal. Bladder function is normal. Patient is sexually active. Contraception method is Depo-Provera injections. Postpartum depression screening: negative.    The pregnancy intention screening data noted above was reviewed. Potential methods of contraception were discussed. The patient elected to proceed with No data recorded.   Edinburgh Postnatal Depression Scale - 07/18/23 1348       Edinburgh Postnatal Depression Scale:  In the Past 7 Days   I have been able to laugh and see the funny side of things. 0    I have looked forward with enjoyment to things. 0    I have blamed myself unnecessarily when things went wrong. 0    I have been anxious or worried for no good reason. 0    I have felt scared or panicky for no good reason. 0    Things have been getting on top of me. 2    I have been so unhappy that I have had difficulty sleeping. 0    I have felt sad or miserable. 0    I have been so unhappy that I have been crying. 0    The thought of harming myself has occurred to me. 0    Edinburgh Postnatal Depression Scale Total 2             Health Maintenance Due  Topic Date Due   COVID-19 Vaccine (1 - 2024-25 season) Never done    The following portions of the patient's history were reviewed and updated as appropriate: allergies, current medications, past family history, past medical history, past social history, past surgical history, and problem list.  Review of Systems Pertinent items noted in HPI and remainder of comprehensive ROS otherwise  negative.  Objective:  BP 116/74   Pulse (!) 109   Wt 194 lb (88 kg)   LMP 08/31/2022 (Exact Date) Comment: [redacted] weeks pregnant  Breastfeeding Yes   BMI 30.38 kg/m    Constitutional: Alert, oriented female in no physical distress.  HEENT: PERRLA Skin: normal color and turgor, no rash Cardiovascular: normal rate & rhythm Respiratory: normal effort, no problems with respiration noted GI: Abd soft, non-tender MS: Extremities nontender, no edema, normal ROM Neurologic: Alert and oriented x 4.  GU: no CVA tenderness Pelvic: not indicated Breasts: normal lactating breasts, no edema or signs of nipple damage  Assessment:  1. Postpartum care and examination (Primary) - Recovering well  2. Mixed anxiety and depressive disorder - Stable  3. Postpartum hypertension - BP normal today, has not taken meds for 2 days  4. Encounter for female birth control - medroxyPROGESTERone Acetate SUSY 150 mg   Plan:   Essential components of care per ACOG recommendations:  1.  Mood and well being: Patient with negative depression screening today. Reviewed local resources for support.  - Patient tobacco use? No.   - hx of drug use? No.    2. Infant care and feeding:  -Patient currently breastmilk feeding? Yes. Reviewed importance of draining breast regularly to support lactation.  -Social determinants of health (SDOH) reviewed in EPIC. No concerns  3. Sexuality,  contraception and birth spacing - Patient does not want a pregnancy in the next year.  Desired family size is 1 children.  - Reviewed reproductive life planning. Reviewed contraceptive methods based on pt preferences and effectiveness.  Patient desired Hormonal Injection today.   - Discussed birth spacing of 18 months  4. Sleep and fatigue -Encouraged family/partner/community support of 4 hrs of uninterrupted sleep to help with mood and fatigue  5. Physical Recovery  - Discussed patients delivery and complications. She describes her  labor as good. - Patient had a VAVD. Patient had  no  laceration. Perineal healing reviewed. Patient expressed understanding - Patient has urinary incontinence? No. - Patient is safe to resume physical and sexual activity  6.  Health Maintenance - HM due items addressed Yes - Last pap smear  Diagnosis  Date Value Ref Range Status  11/22/2022   Final   - Negative for intraepithelial lesion or malignancy (NILM)   Pap smear not done at today's visit.  -Breast Cancer screening indicated? No.   7. Chronic Disease/Pregnancy Condition follow up: Hypertension - PCP follow up  Bernerd Limbo, CNM [facility] [employer], [facility]

## 2023-07-19 ENCOUNTER — Ambulatory Visit: Payer: Commercial Managed Care - HMO | Admitting: Obstetrics & Gynecology

## 2023-07-25 ENCOUNTER — Encounter: Payer: Self-pay | Admitting: Certified Nurse Midwife

## 2023-07-25 DIAGNOSIS — B3789 Other sites of candidiasis: Secondary | ICD-10-CM

## 2023-07-25 NOTE — Telephone Encounter (Signed)
 Mom messaged clinic stating baby has thrush and mom needs to be treated. LC called mom to discuss. Reports baby's mouth having thrush and moms nipples are very sore, cracked, and breast pain is sharp. Was advised to use coconut oil on nipples and that has provided slight relief. Was advised to treat baby's mouth with prescribed medication from Pediatrician. Shared currently latching and pumping and has a lot of milk with goal to focus on just pumping and giving bottles. Appreciates call and is aware that thrush protocol will be sent to her, as well as her provider assisting with thrush medication for mom. Discussed protocol, and calling with any additional questions or concerns.    Vermell Pelt BSW, IBCLC

## 2023-07-25 NOTE — Telephone Encounter (Signed)
 Will route to Lactation Nurse

## 2023-07-26 MED ORDER — NYSTATIN 100000 UNIT/GM EX CREA: TOPICAL_CREAM | CUTANEOUS | 1 refills | Status: DC

## 2023-07-26 NOTE — Addendum Note (Signed)
 Addended by: Fonda Hymen on: 07/26/2023 04:50 PM   Modules accepted: Orders

## 2023-07-26 NOTE — BH Specialist Note (Signed)
 Integrated Behavioral Health via Telemedicine Visit  08/06/2023 [name] [id number]  Number of Integrated Behavioral Health Clinician visits: 1- Initial Visit  Session Start time: [phone]   Session End time: 0912  Total time in minutes: 22   Referring Provider: Federico Flake, MD Patient/Family location: Home [facility] Provider location: [facility]  All persons participating in visit: Patient Natasha Beard and [facility] Puneet Masoner   Types of Service: Individual psychotherapy and Video visit  I connected with [name] and/or Hector Shade  n/a  via  Telephone or Video Enabled Telemedicine Application  (Video is Caregility application) and verified that I am speaking with the correct person using two identifiers. Discussed confidentiality: Yes   I discussed the limitations of telemedicine and the availability of in person appointments.  Discussed there is a possibility of technology failure and discussed alternative modes of communication if that failure occurs.  I discussed that engaging in this telemedicine visit, they consent to the provision of behavioral healthcare and the services will be billed under their insurance.  Patient and/or legal guardian expressed understanding and consented to Telemedicine visit: Yes   Presenting Concerns: Patient and/or family reports the following symptoms/concerns: Increased depression and anxiety early postpartum; mood improvement; sleeping and eating well, with good support at home; processing first birthing experience.  Duration of problem: Postpartum  Patient and/or Family's Strengths/Protective Factors: Social connections, Social and Emotional competence, Concrete supports in place (healthy food, safe environments, etc.), Sense of purpose, and Physical Health (exercise, healthy diet, medication compliance, etc.)  Goals Addressed: Patient will:  Maintain reduced symptoms of: anxiety and  depression    Progress towards Goals: Achieved  Interventions: Interventions utilized:  Psychoeducation and/or Health Education, Link to [employer], and Supportive Reflection Standardized Assessments completed: GAD-7 and PHQ 9  Patient and/or Family Response: Patient agrees with treatment plan.   Assessment: Patient currently experiencing At risk for depression postpartum.   Patient may benefit from psychoeducation and brief therapeutic interventions regarding maintaining reduced system of depression and anxiety.  .  Plan: Follow up with behavioral health clinician on : Call Varshini Arrants at [phone], as needed. Behavioral recommendations:  -Continue prioritizing healthy self-care (regular meals, adequate rest; allowing practical help from supportive friends and family)  -Consider new mom support group as needed at either www.postpartum.net or www.conehealthybaby.com  -Read through information on After Visit Summary; use as needed and discussed  Referral(s): Integrated Art gallery manager (In Clinic) and [employer]:  Childcare; New mom support  I discussed the assessment and treatment plan with the patient and/or parent/guardian. They were provided an opportunity to ask questions and all were answered. They agreed with the plan and demonstrated an understanding of the instructions.   They were advised to call back or seek an in-person evaluation if the symptoms worsen or if the condition fails to improve as anticipated.  Valetta Close Zalmen Wrightsman, LCSW     08/06/2023    9:07 AM 06/27/2023    2:05 PM 11/22/2022    1:30 PM  Depression screen PHQ 2/9  Decreased Interest 0 1 2  Down, Depressed, Hopeless 0 0 0  PHQ - 2 Score 0 1 2  Altered sleeping 0  2  Tired, decreased energy 0  3  Change in appetite 0  1  Feeling bad or failure about yourself  0  0  Trouble concentrating 0  2  Moving slowly or fidgety/restless 0  0  Suicidal thoughts 0  0  PHQ-9 Score 0  10       08/06/2023    9:08 AM 06/27/2023    2:06 PM 11/22/2022    1:30 PM  GAD 7 : Generalized Anxiety Score  Nervous, Anxious, on Edge 0 1 2  Control/stop worrying 0  2  Worry too much - different things 0 0 2  Trouble relaxing 0 0 3  Restless 0 0 0  Easily annoyed or irritable 0 1 2  Afraid - awful might happen 0 0 1  Total GAD 7 Score 0  12  Anxiety Difficulty  Not difficult at all

## 2023-08-06 ENCOUNTER — Ambulatory Visit: Payer: Commercial Managed Care - HMO | Admitting: Clinical

## 2023-08-06 DIAGNOSIS — Z9189 Other specified personal risk factors, not elsewhere classified: Secondary | ICD-10-CM | POA: Diagnosis not present

## 2023-08-06 NOTE — Patient Instructions (Signed)
[facility] [address] [place], [place] [address] [phone] (main office) [phone] (Rodney Yera's office)  New Parent Support Groups www.postpartum.net www.conehealthybaby.com  [place] [employer]  (Childcare options, Early childcare development, etc.) www.[place]childdev.org  [facility] Search Engine  https://ncchildcare.ncdhhs.gov/childcaresearch  

## 2023-08-14 ENCOUNTER — Ambulatory Visit: Payer: Commercial Managed Care - HMO | Admitting: Nurse Practitioner

## 2023-08-15 ENCOUNTER — Other Ambulatory Visit: Payer: Self-pay | Admitting: Lactation Services

## 2023-08-15 MED ORDER — FLUCONAZOLE 100 MG PO TABS: ORAL_TABLET | ORAL | 0 refills | Status: DC

## 2023-08-15 NOTE — Progress Notes (Signed)
 Oral Diflucan sent to Pharmacy per standing order for continued stabbing shooting pains in the breast after topical treatment with Nystatin. She also has a rash under her breast that is itchy. Reviewed yeast protocol with her again. Infant has had oral Thrush recently also, patient is breast feeding.

## 2023-08-18 ENCOUNTER — Encounter: Payer: Self-pay | Admitting: Certified Nurse Midwife

## 2023-08-20 ENCOUNTER — Other Ambulatory Visit: Payer: Self-pay | Admitting: Family Medicine

## 2023-09-03 ENCOUNTER — Encounter: Payer: Self-pay | Admitting: Nurse Practitioner

## 2023-09-03 ENCOUNTER — Ambulatory Visit (INDEPENDENT_AMBULATORY_CARE_PROVIDER_SITE_OTHER): Payer: Self-pay | Admitting: Nurse Practitioner

## 2023-09-03 VITALS — BP 125/66 | HR 95 | Temp 98.1°F | Wt 196.8 lb

## 2023-09-03 DIAGNOSIS — L309 Dermatitis, unspecified: Secondary | ICD-10-CM

## 2023-09-03 MED ORDER — TRIAMCINOLONE ACETONIDE 0.1 % EX CREA: 1.0000 | TOPICAL_CREAM | Freq: Two times a day (BID) | CUTANEOUS | 0 refills | Status: DC

## 2023-09-03 NOTE — Assessment & Plan Note (Signed)
 Encouraged to apply Kenalog 0.1% cream to affected areas twice daily for 2 to 4 weeks. Need to make sure that the baby does not ingest medication discussed Encouraged to keep skin well moisturized, avoid hot shower/baths  - triamcinolone cream (KENALOG) 0.1 %; Apply 1 Application topically 2 (two) times daily

## 2023-09-03 NOTE — Patient Instructions (Signed)
.   Eczema, unspecified type (Primary)  - triamcinolone cream (KENALOG) 0.1 %; Apply 1 Application topically 2 (two) times daily.  Dispense: 30 g; Refill: 0  Apply  twice daily to affected areas for 2 to 4 weeks    It is important that you exercise regularly at least 30 minutes 5 times a week as tolerated  Think about what you will eat, plan ahead. Choose " clean, green, fresh or frozen" over canned, processed or packaged foods which are more sugary, salty and fatty. 70 to 75% of food eaten should be vegetables and fruit. Three meals at set times with snacks allowed between meals, but they must be fruit or vegetables. Aim to eat over a 12 hour period , example 7 am to 7 pm, and STOP after  your last meal of the day. Drink water,generally about 64 ounces per day, no other drink is as healthy. Fruit juice is best enjoyed in a healthy way, by EATING the fruit.  Thanks for choosing [facility] we consider it a privelige to serve you.

## 2023-09-03 NOTE — Progress Notes (Signed)
 Acute Office Visit  Subjective:     Patient ID: Natasha Beard, female    DOB: Jun 16, 1996, 28 y.o.   MRN: [id number]  Chief Complaint  Patient presents with   Rash    Rash under breast And my eczema is flaring up    HPI Natasha Beard  has a past medical history of Allergy, Anxiety, Arthritis, and Depression.  Patient presents with complaints of itchy rashes under both breasts, bilateral antecubital area, behind her neck  and in between her legs.  She had postpartum yeast infection of both nipples and was treated with nystatin cream, yeast infection of both nipples have since resolved.  No complaints of fever, chills, chest pain, shortness of breath.    Review of Systems  Constitutional:  Negative for appetite change, chills, fatigue and fever.  HENT:  Negative for congestion, postnasal drip, rhinorrhea and sneezing.   Respiratory:  Negative for cough, shortness of breath and wheezing.   Cardiovascular:  Negative for chest pain, palpitations and leg swelling.  Gastrointestinal:  Negative for abdominal pain, constipation, nausea and vomiting.  Genitourinary:  Negative for difficulty urinating, dysuria, flank pain and frequency.  Musculoskeletal:  Negative for arthralgias, back pain, joint swelling and myalgias.  Skin:  Negative for color change, pallor, rash and wound.  Neurological:  Negative for dizziness, facial asymmetry, weakness, numbness and headaches.  Psychiatric/Behavioral:  Negative for behavioral problems, confusion, self-injury and suicidal ideas.         Objective:    BP 125/66   Pulse 95   Temp 98.1 F (36.7 C) (Oral)   Wt 196 lb 12.8 oz (89.3 kg)   SpO2 98%   BMI 30.82 kg/m    Physical Exam Vitals and nursing note reviewed.  Constitutional:      General: She is not in acute distress.    Appearance: Normal appearance. She is not ill-appearing, toxic-appearing or diaphoretic.  HENT:     Mouth/Throat:     Mouth: Mucous membranes are moist.     Pharynx:  Oropharynx is clear. No oropharyngeal exudate or posterior oropharyngeal erythema.  Eyes:     General: No scleral icterus.       Right eye: No discharge.        Left eye: No discharge.     Extraocular Movements: Extraocular movements intact.     Conjunctiva/sclera: Conjunctivae normal.  Cardiovascular:     Rate and Rhythm: Normal rate and regular rhythm.     Pulses: Normal pulses.     Heart sounds: Normal heart sounds. No murmur heard.    No friction rub. No gallop.  Pulmonary:     Effort: Pulmonary effort is normal. No respiratory distress.     Breath sounds: Normal breath sounds. No stridor. No wheezing, rhonchi or rales.  Chest:     Chest wall: No tenderness.  Abdominal:     General: There is no distension.     Palpations: Abdomen is soft.     Tenderness: There is no abdominal tenderness. There is no right CVA tenderness, left CVA tenderness or guarding.  Musculoskeletal:        General: No swelling, tenderness, deformity or signs of injury.     Right lower leg: No edema.     Left lower leg: No edema.  Skin:    General: Skin is warm and dry.     Capillary Refill: Capillary refill takes less than 2 seconds.     Coloration: Skin is not jaundiced or pale.  Findings: Rash present. No bruising, erythema or lesion.     Comments: Non erythematous rashes consistent with eczema noted on the lower quadrants of bilateral breasts, bilateral AC and posterior neck  Neurological:     Mental Status: She is alert and oriented to person, place, and time.     Motor: No weakness.     Coordination: Coordination normal.     Gait: Gait normal.  Psychiatric:        Mood and Affect: Mood normal.        Behavior: Behavior normal.        Thought Content: Thought content normal.        Judgment: Judgment normal.     No results found for any visits on 09/03/23.      Assessment & Plan:   Problem List Items Addressed This Visit       Musculoskeletal and Integument   Eczema - Primary    Encouraged to apply Kenalog 0.1% cream to affected areas twice daily for 2 to 4 weeks. Need to make sure that the baby does not ingest medication discussed Encouraged to keep skin well moisturized, avoid hot shower/baths  - triamcinolone cream (KENALOG) 0.1 %; Apply 1 Application topically 2 (two) times daily         Relevant Medications   triamcinolone cream (KENALOG) 0.1 %    Meds ordered this encounter  Medications   triamcinolone cream (KENALOG) 0.1 %    Sig: Apply 1 Application topically 2 (two) times daily.    Dispense:  30 g    Refill:  0    No follow-ups on file.  Donell Beers, FNP

## 2023-09-11 ENCOUNTER — Encounter: Payer: Self-pay | Admitting: Certified Nurse Midwife

## 2023-09-12 ENCOUNTER — Telehealth: Payer: Self-pay | Admitting: Lactation Services

## 2023-09-12 NOTE — Telephone Encounter (Addendum)
 Calling Natasha Beard to return her Mychart message stating   "Hello my apologies I missed your call. No fever that I am aware of no chills or body aches. However I am congested, experienced fatigue when pumping frequently, pain in breast often. I'm am not sure about the color because I also have an ezema flare on my breast.the lump has been there for about a week now. I will try the ice and see if that helps "  LC calling Natasha Beard to offer virtual appointment.  Natasha Beard states she did ice and it is not painful right now and has not noticed the lump, also stating she does not have pain with pumping just reports getting nauseous when pumping in the beginning, and eats before pumping, unsure why and thinks it maybe because she has a fast metabolism. Reports the only time it hurt, was when she was laying down on her side, and has not noticed it since. Natasha Beard states she is not sick just has some congestion.  Natasha Beard agreed to virtual consult at 2 pm tomorrow. LC let mom know we would schedule it so we could further discuss concern and glad to hear it was no longer painful, and lump was not noticeable.  LC discussed virtual appointment details. Follow up at that time.

## 2023-09-13 ENCOUNTER — Telehealth (INDEPENDENT_AMBULATORY_CARE_PROVIDER_SITE_OTHER): Admitting: Lactation Services

## 2023-09-13 NOTE — Progress Notes (Addendum)
 Outpatient Lactation Consultation Note** VIRTUAL CONSULT** *Date of Service 09/13/23  *Patient/provider locations: Patient was at home and provider was at [facility] *Visit was via telehealth/ video for 20 minutes total  Subjective:   Natasha Beard is a 28 y.o. G23P1001 female here for a VIRTUAL lactation consultation.   Natasha Beard reports: -Left breast is all better, and was concerned with a lump. -The lump is resolved and feels the ice helped.  -Has been pumping about 6 ounces per breast with wearable breast pump every 2 hours using size 19 mm flange inserts for 15-20 minutes. -Reports baby drinks 5-8 ounces per bottle and latches about 2-3 x a day on both breasts.  -States plenty of wet and dirty diapers and growing well. -Would like an in person consult on April 14 th at 8:30 am for further lactation support.  Baby is currently feeding by Exclusively breastmilk. Breastmilk is being provided by  Bottle and breast  Mother is pumping - Yes  Reports Breast/Nipple are 2 = Soft / non-tender  Birth History Parent delivered on 06/07/2023 via SVD   Postpartum Discharge Summary                          Patient Name: Natasha Beard DOB: Mar 21, 1996 MRN: [id number]   Date of admission: 06/06/2023 Delivery date:06/07/2023 Delivering provider: IZELL HARARI Date of discharge: 06/08/2023   Admitting diagnosis: Normal labor and delivery [O80] Intrauterine pregnancy: [redacted]w[redacted]d     Secondary diagnosis:  Principal Problem:   Normal labor and delivery Active Problems:   Positive GBS test   Additional problems: none                            Discharge diagnosis: Term Pregnancy Delivered                                              Post partum procedures: DMPA prior to d/c   Augmentation: AROM Complications: None   Hospital course: Onset of Labor With Vaginal Delivery      28 y.o. yo G1P1001 at [redacted]w[redacted]d was admitted in Latent Labor on 06/06/2023. Labor course was complicated by deep FHR  variables that became worse after pushing x 1h; vacuum was recommended and accepted.  Membrane Rupture Time/Date: 4:38 PM,06/06/2023  Delivery Method:Vaginal, Vacuum (Extractor) Operative Delivery:Device used:Kiwi Indication: Fetal indications Episiotomy: None Lacerations:  None Patient had a postpartum course that was complicated.  She is ambulating, tolerating a regular diet, passing flatus, and urinating well. Patient is discharged home in stable condition on 06/08/23.   Newborn Data: Birth date:06/07/2023 Birth time:2:50 AM Gender:Female Living status:Living Apgars:8 ,10  Weight:3210 g (7lb 1.2oz)   Magnesium Sulfate received: No BMZ received: No Rhophylac:N/A MMR:N/A T-DaP:Given prenatally Flu: Yes RSV Vaccine received: Yes Transfusion:No  Assessment and Plan:  Lactation Consultant Counseled On:  -Milk production -Infant stomach size -Volume intake  -Difference in latching and pumping, and hands on pumping/ gentle breast massage when pumping -How to manage possible plugged milk ducts/ ductal inflammation -Out patient in person lactation consult to help with pre post weight check, flange fit, and pumping support/ tips.  ** At in office appointment, discuss options, to help manage over supply**  Follow up with lactation on April 14 th at 8:30 am  Natasha Beard, [facility]  and Natasha Beard [facility]

## 2023-09-13 NOTE — Patient Instructions (Signed)
 Custom feeding plan to support the goal of continuing to exclusively provide breast milk for baby   Feed infant on demand Feed infant skin to skin to skin Gentle breast/chest compressions while nursing Pace bottle feed when supplementing a bottle of breast milk Infant needs about 94-125 mL ( 3-4 ounces) for 8 feeds a day, or [phone] ml (25-33 ounces) in 24 hours.  Feed infant until satisfied. Hands on pumping/ very gentle breast massage when pumping If breast lump appears again, encouraged to use ice and feather light reverse pressure softening Follow up on 4.14.2025 for in person consultation for feeding assessment and can discuss options to help manage over production.  Please feel free to out with any lactation related questions or concerns [phone] and press [phone] to leave a message for our lactation voicemail box.  For community lactation support groups,check out [email or web address]  Follow up with Lactation for appointment on April 14 th at 8:30 am  Thank you for allowing [place] to support you today,  Juventino Slovak BSW, IBCLC and Jimmy Picket RN [facility]

## 2023-10-04 ENCOUNTER — Encounter: Payer: Self-pay | Admitting: General Practice

## 2023-10-04 ENCOUNTER — Other Ambulatory Visit: Payer: Self-pay

## 2023-10-04 ENCOUNTER — Ambulatory Visit: Payer: Commercial Managed Care - HMO

## 2023-10-04 VITALS — BP 124/79 | HR 88 | Ht 67.0 in | Wt 198.0 lb

## 2023-10-04 DIAGNOSIS — Z3042 Encounter for surveillance of injectable contraceptive: Secondary | ICD-10-CM

## 2023-10-04 MED ORDER — MEDROXYPROGESTERONE ACETATE 150 MG/ML IM SUSY
150.0000 mg | PREFILLED_SYRINGE | Freq: Once | INTRAMUSCULAR | Status: AC
Start: 2023-10-04 — End: 2023-10-04
  Administered 2023-10-04: 150 mg via INTRAMUSCULAR

## 2023-10-04 NOTE — Progress Notes (Signed)
 [name] here for Depo-Provera Injection. Injection administered without complication. Patient will return in 3 months for next injection between 7/3 and 7/17. Next annual visit due January 2026.   Doria Garden, RN 10/04/2023  10:03 AM

## 2023-10-08 NOTE — Telephone Encounter (Signed)
 Pt was called back and advised she feels better. KH

## 2023-10-15 NOTE — Addendum Note (Signed)
 Addended by: Granville Layer on: 10/15/2023 08:54 AM   Modules accepted: Level of Service

## 2023-10-16 ENCOUNTER — Ambulatory Visit

## 2023-10-16 ENCOUNTER — Other Ambulatory Visit (HOSPITAL_COMMUNITY)
Admission: RE | Admit: 2023-10-16 | Discharge: 2023-10-16 | Disposition: A | Discharge status: Home / Self Care | Source: Ambulatory Visit | Attending: Family Medicine | Admitting: Family Medicine

## 2023-10-16 ENCOUNTER — Other Ambulatory Visit: Payer: Self-pay

## 2023-10-16 VITALS — BP 103/76 | HR 89 | Ht 67.0 in | Wt 197.0 lb

## 2023-10-16 DIAGNOSIS — Z113 Encounter for screening for infections with a predominantly sexual mode of transmission: Secondary | ICD-10-CM | POA: Diagnosis present

## 2023-10-16 NOTE — Progress Notes (Signed)
 Natasha Beard is here with request of STI testing. Patient reports vaginal itchiness for 1.5 weeks. No vaginal discharge or odor present.   Self swab instructions given and specimen obtained. Explained patient will be contacted with any abnormal results. Patient is not due for annual exam. Last annual was on January 2026.   Lennart Quitter, RN 10/16/2023  10:04 AM

## 2023-10-17 LAB — CERVICOVAGINAL ANCILLARY ONLY
Bacterial Vaginitis (gardnerella): POSITIVE — AB
Candida Glabrata: NEGATIVE
Candida Vaginitis: NEGATIVE
Chlamydia: NEGATIVE
Comment: NEGATIVE
Comment: NEGATIVE
Comment: NEGATIVE
Comment: NEGATIVE
Comment: NEGATIVE
Comment: NORMAL
Neisseria Gonorrhea: NEGATIVE
Trichomonas: NEGATIVE

## 2023-10-18 ENCOUNTER — Telehealth: Admitting: Nurse Practitioner

## 2023-10-18 ENCOUNTER — Encounter: Payer: Self-pay | Admitting: Advanced Practice Midwife

## 2023-10-18 ENCOUNTER — Telehealth: Payer: Self-pay

## 2023-10-18 ENCOUNTER — Encounter: Payer: Self-pay | Admitting: Nurse Practitioner

## 2023-10-18 ENCOUNTER — Other Ambulatory Visit: Payer: Self-pay | Admitting: Advanced Practice Midwife

## 2023-10-18 VITALS — Ht 67.0 in | Wt 197.0 lb

## 2023-10-18 DIAGNOSIS — L239 Allergic contact dermatitis, unspecified cause: Secondary | ICD-10-CM | POA: Diagnosis not present

## 2023-10-18 DIAGNOSIS — R21 Rash and other nonspecific skin eruption: Secondary | ICD-10-CM

## 2023-10-18 DIAGNOSIS — N760 Acute vaginitis: Secondary | ICD-10-CM

## 2023-10-18 MED ORDER — METRONIDAZOLE 500 MG PO TABS: 500.0000 mg | ORAL_TABLET | Freq: Two times a day (BID) | ORAL | 0 refills | Status: DC

## 2023-10-18 MED ORDER — CLOTRIMAZOLE-BETAMETHASONE 1-0.05 % EX CREA: 1.0000 | TOPICAL_CREAM | Freq: Every day | CUTANEOUS | 0 refills | Status: DC

## 2023-10-18 MED ORDER — PREDNISONE 10 MG PO TABS: ORAL_TABLET | ORAL | 0 refills | Status: DC

## 2023-10-18 NOTE — Telephone Encounter (Signed)
 Copied from CRM [id number]. Topic: General - Other >> Oct 18, 2023 10:13 AM Alysia Jumbo S wrote: Reason for CRM: Att: Jullie Oiler- Patient returning missed phone call. Contacted CAL, no response. Patient request a callback.  Called back and made an appointment. Kh

## 2023-10-18 NOTE — Progress Notes (Signed)
 Virtual Visit via Video Note  I connected with Natasha Beard on 10/18/23 at 11:20 AM EDT by a video enabled telemedicine application and verified that I am speaking with the correct person using two identifiers.  Location: Patient: home Provider: office   I discussed the limitations of evaluation and management by telemedicine and the availability of in person appointments. The patient expressed understanding and agreed to proceed.   History of Present Illness:  Patient presents today video visit for an acute visit today.  She has a rash to her bilateral breast neck and arms.  She states that this started a few weeks ago.  She has tried Kenalog  cream for this with minimal relief noted.  The rash does seem to be spreading.  She has not changed any soaps or detergents.  We will place a referral for allergy specialist.  We will trial Lotrisone  cream and prednisone . Denies f/c/s, n/v/d, hemoptysis, PND, leg swelling Denies chest pain or edema     Observations/Objective:     10/18/2023   11:05 AM 10/16/2023   10:04 AM 10/04/2023   10:05 AM  Vitals with BMI  Height 5\' 7"  5\' 7"  5\' 7"   Weight 197 lbs 197 lbs 198 lbs  BMI 30.85 30.85 31  Systolic  103 124  Diastolic  76 79  Pulse  89 88      Assessment and Plan:  1. Rash (Primary)  - predniSONE  (DELTASONE ) 10 MG tablet; Take 4 tabs for 2 days, then 3 tabs for 2 days, then 2 tabs for 2 days, then 1 tab for 2 days, then stop  Dispense: 20 tablet; Refill: 0 - clotrimazole -betamethasone  (LOTRISONE ) cream; Apply 1 Application topically daily.  Dispense: 30 g; Refill: 0 - Ambulatory referral to Allergy  2. Allergic contact dermatitis, unspecified trigger  - predniSONE  (DELTASONE ) 10 MG tablet; Take 4 tabs for 2 days, then 3 tabs for 2 days, then 2 tabs for 2 days, then 1 tab for 2 days, then stop  Dispense: 20 tablet; Refill: 0 - clotrimazole -betamethasone  (LOTRISONE ) cream; Apply 1 Application topically daily.  Dispense: 30 g; Refill: 0 -  Ambulatory referral to Allergy     I discussed the assessment and treatment plan with the patient. The patient was provided an opportunity to ask questions and all were answered. The patient agreed with the plan and demonstrated an understanding of the instructions.   The patient was advised to call back or seek an in-person evaluation if the symptoms worsen or if the condition fails to improve as anticipated.  I provided 23 minutes of non-face-to-face time during this encounter.   Jerrlyn Morel, NP

## 2023-10-24 ENCOUNTER — Encounter: Payer: Self-pay | Admitting: Certified Nurse Midwife

## 2023-11-08 ENCOUNTER — Emergency Department (HOSPITAL_COMMUNITY)

## 2023-11-08 ENCOUNTER — Encounter (HOSPITAL_BASED_OUTPATIENT_CLINIC_OR_DEPARTMENT_OTHER): Payer: Self-pay

## 2023-11-08 ENCOUNTER — Emergency Department (HOSPITAL_BASED_OUTPATIENT_CLINIC_OR_DEPARTMENT_OTHER)
Admission: EM | Admit: 2023-11-08 | Discharge: 2023-11-08 | Disposition: A | Discharge status: Home / Self Care | Source: Home / Self Care | Attending: Emergency Medicine | Admitting: Emergency Medicine

## 2023-11-08 ENCOUNTER — Ambulatory Visit: Payer: Self-pay

## 2023-11-08 ENCOUNTER — Emergency Department (HOSPITAL_COMMUNITY)
Admission: EM | Admit: 2023-11-08 | Discharge: 2023-11-08 | Discharge status: Against Medical Advice | Attending: Emergency Medicine | Admitting: Emergency Medicine

## 2023-11-08 ENCOUNTER — Emergency Department (HOSPITAL_BASED_OUTPATIENT_CLINIC_OR_DEPARTMENT_OTHER)

## 2023-11-08 ENCOUNTER — Other Ambulatory Visit: Payer: Self-pay

## 2023-11-08 ENCOUNTER — Encounter (HOSPITAL_COMMUNITY): Payer: Self-pay | Admitting: Emergency Medicine

## 2023-11-08 DIAGNOSIS — W108XXA Fall (on) (from) other stairs and steps, initial encounter: Secondary | ICD-10-CM | POA: Insufficient documentation

## 2023-11-08 DIAGNOSIS — W19XXXA Unspecified fall, initial encounter: Secondary | ICD-10-CM

## 2023-11-08 DIAGNOSIS — M533 Sacrococcygeal disorders, not elsewhere classified: Secondary | ICD-10-CM | POA: Diagnosis not present

## 2023-11-08 DIAGNOSIS — R519 Headache, unspecified: Secondary | ICD-10-CM | POA: Insufficient documentation

## 2023-11-08 DIAGNOSIS — M545 Low back pain, unspecified: Secondary | ICD-10-CM | POA: Insufficient documentation

## 2023-11-08 DIAGNOSIS — Z5321 Procedure and treatment not carried out due to patient leaving prior to being seen by health care provider: Secondary | ICD-10-CM | POA: Insufficient documentation

## 2023-11-08 LAB — POC URINE PREG, ED: Preg Test, Ur: NEGATIVE

## 2023-11-08 MED ORDER — KETOROLAC TROMETHAMINE 15 MG/ML IJ SOLN
15.0000 mg | Freq: Once | INTRAMUSCULAR | Status: AC
  Administered 2023-11-08: 15 mg via INTRAMUSCULAR
  Filled 2023-11-08: qty 1

## 2023-11-08 NOTE — ED Provider Notes (Signed)
 [place] EMERGENCY DEPARTMENT AT [facility] Provider Note   CSN: [id number] Arrival date & time: 11/08/23  1335     History  Chief Complaint  Patient presents with   Back Pain   Fall    Natasha Beard is a 28 y.o. female.   Back Pain Fall  Patient is a 28 year old female sent to the ED able complaints of low back pain and sacral pain post falling down 13 steps, noting that she did hit her head but did not lose consciousness and is not on blood thinners and has ambulated since the fall.  Noted to be a mechanical fall, slipping as she went down the stairs.  Notes that she has had to limp due to the pain she experienced in her sacral area. Notes 1 episode of emesis today but since has not had any nausea. Denies weakness, numbness, tingling, fecal incontinence, urinary incontinence, saddle paresthesia.      Home Medications Prior to Admission medications   Medication Sig Start Date End Date Taking? Authorizing Provider  clotrimazole -betamethasone  (LOTRISONE ) cream Apply 1 Application topically daily. 10/18/23   Jerrlyn Morel, NP  metroNIDAZOLE  (FLAGYL ) 500 MG tablet Take 1 tablet (500 mg total) by mouth 2 (two) times daily. 10/18/23   Smith, [place] , CNM  predniSONE  (DELTASONE ) 10 MG tablet Take 4 tabs for 2 days, then 3 tabs for 2 days, then 2 tabs for 2 days, then 1 tab for 2 days, then stop 10/18/23   Nichols, Tonya S, NP  Prenatal Vit-Fe Phos-FA-Omega (VITAFOL  GUMMIES) 3.33-0.333-34.8 MG CHEW Chew 3 tablets by mouth daily. 06/25/23   [provider]  triamcinolone  cream (KENALOG ) 0.1 % Apply 1 Application topically 2 (two) times daily. Patient not taking: Reported on 10/18/2023 09/03/23   Paseda, Folashade R, FNP      Allergies    Apple juice, Chocolate, and Erythromycin     Review of Systems   Review of Systems  Musculoskeletal:  Positive for back pain.  All other systems reviewed and are negative.   Physical Exam Updated Vital Signs BP (!) 139/120 (BP  Location: Left Arm)   Pulse (!) 103   Temp 98 F (36.7 C)   Resp 20   Ht 5\' 7"  (1.702 m)   Wt 89 kg   SpO2 100%   Breastfeeding Yes   BMI 30.73 kg/m  Physical Exam Vitals and nursing note reviewed.  Constitutional:      General: She is not in acute distress.    Appearance: Normal appearance. She is not ill-appearing.  HENT:     Head: Normocephalic and atraumatic.  Eyes:     General: No scleral icterus.       Right eye: No discharge.        Left eye: No discharge.     Extraocular Movements: Extraocular movements intact.     Conjunctiva/sclera: Conjunctivae normal.  Cardiovascular:     Rate and Rhythm: Normal rate and regular rhythm.     Pulses: Normal pulses.     Heart sounds: Normal heart sounds. No murmur heard.    No friction rub. No gallop.  Pulmonary:     Effort: Pulmonary effort is normal. No respiratory distress.     Breath sounds: Normal breath sounds. No stridor. No wheezing, rhonchi or rales.  Chest:     Chest wall: No tenderness.  Abdominal:     General: Abdomen is flat. There is no distension.     Palpations: Abdomen is soft.     Tenderness: There  is no abdominal tenderness. There is no right CVA tenderness, left CVA tenderness, guarding or rebound.  Musculoskeletal:        General: Tenderness (Midline tenderness noted to palpation over the lumbar and sacral.) present. No swelling or signs of injury.     Cervical back: Normal range of motion and neck supple. No rigidity or tenderness.     Right lower leg: No edema.     Left lower leg: No edema.  Skin:    General: Skin is warm and dry.     Findings: No bruising, erythema or lesion.  Neurological:     General: No focal deficit present.     Mental Status: She is alert and oriented to person, place, and time. Mental status is at baseline.     Sensory: No sensory deficit.     Motor: No weakness.     Coordination: Coordination normal.  Psychiatric:        Mood and Affect: Mood normal.     ED Results /  Procedures / Treatments   Labs (all labs ordered are listed, but only abnormal results are displayed) Labs Reviewed  PREGNANCY, URINE    EKG None  Radiology CT Head Wo Contrast Result Date: 11/08/2023 CLINICAL DATA:  Recent fall with headaches, initial encounter EXAM: CT HEAD WITHOUT CONTRAST TECHNIQUE: Contiguous axial images were obtained from the base of the skull through the vertex without intravenous contrast. RADIATION DOSE REDUCTION: This exam was performed according to the departmental dose-optimization program which includes automated exposure control, adjustment of the mA and/or kV according to patient size and/or use of iterative reconstruction technique. COMPARISON:  04/18/2019 FINDINGS: Brain: No evidence of acute infarction, hemorrhage, hydrocephalus, extra-axial collection or mass lesion/mass effect. Vascular: No hyperdense vessel or unexpected calcification. Skull: Normal. Negative for fracture or focal lesion. Sinuses/Orbits: No acute finding. Other: None. IMPRESSION: No acute intracranial abnormality noted. Electronically Signed   By: Violeta Grey M.D.   On: 11/08/2023 01:49   DG Sacrum/Coccyx Result Date: 11/08/2023 CLINICAL DATA:  Recent fall with sacral pain, initial encounter EXAM: SACRUM AND COCCYX - 2+ VIEW COMPARISON:  None Available. FINDINGS: Pelvic ring is intact. Sacral ala are intact. Displacement of the distal coccygeal segment from the more proximal sacrum is noted likely corresponding to the patient's recent injury and pain. IMPRESSION: Displaced distal coccygeal segment. Electronically Signed   By: Violeta Grey M.D.   On: 11/08/2023 01:48    Procedures Procedures    Medications Ordered in ED Medications  ketorolac (TORADOL) 15 MG/ML injection 15 mg (has no administration in time range)    ED Course/ Medical Decision Making/ A&P                                 Medical Decision Making  This patient is a 28 year old female who presents to the ED for  concern of lumbar pain and sacral pain post fall yesterday.  [place] head CT and neck CT negative.  On physical exam, patient is in no acute distress, afebrile, alert and orient x 4, speaking in full sentences, nontachypneic, nontachycardic.  Notably tender over the lumbar and sacral areas midline.  No focal weakness or sensation loss.  No other injuries noted upper or lower extremities.  Unremarkable exam otherwise.  CT scan revealed indeterminate age coccygeal fracture however due to point tenderness suspect possible from the fall yesterday.  Will recommend symptomatic management at home and Tylenol  for pain  control.  Provided Toradol here as patient was uncomfortable.  Will have her follow-up with PCP or Ortho for further assistance.  Patient vital signs have remained stable throughout the course of patient's time in the ED. Low suspicion for any other emergent pathology at this time. I believe this patient is safe to be discharged. Provided strict return to ER precautions. Patient expressed agreement and understanding of plan. All questions were answered.  Differential diagnoses prior to evaluation: The emergent differential diagnosis includes, but is not limited to, fracture, ligament injury, dislocation, malalignment, muscle strain. This is not an exhaustive differential.   Past Medical History / Co-morbidities / Social History: Palpitations, SVT, eczema, migraines, arthritis  Additional history: Chart reviewed.   Lab Tests/Imaging studies: I personally interpreted labs/imaging and the pertinent results include:  .  CT scan of lumbar spine revealed a coccygeal fracture of indeterminate age  I agree with the radiologist interpretation.  Medications: I ordered medication including Toradol.  I have reviewed the patients home medicines and have made adjustments as needed.  Disposition: After consideration of the diagnostic results and the patients response to treatment, I feel that the  patient would benefit from discharge and treatment as above.   emergency department workup does not suggest an emergent condition requiring admission or immediate intervention beyond what has been performed at this time. The plan is: Symptomatic management at home, return for new or worsening symptoms. The patient is safe for discharge and has been instructed to return immediately for worsening symptoms, change in symptoms or any other concerns.   Final Clinical Impression(s) / ED Diagnoses Final diagnoses:  Fall, initial encounter    Rx / DC Orders ED Discharge Orders     None         Vevelyn Gowers 11/08/23 1847    Tonya Fredrickson, MD 11/09/23 1005

## 2023-11-08 NOTE — Telephone Encounter (Signed)
  Chief Complaint: back pain, mild head ache Symptoms: fall from 13 steps 5/21 Frequency:  Pertinent Negatives: Patient denies  Disposition: [] ED /[x] Urgent Care (no appt availability in office) / [] Appointment(In office/virtual)/ []  [place] [facility]/ [] Home Care/ [] Refused Recommended Disposition /[] [place] [facility]/ []  Follow-up with PCP Additional Notes: patient fell on 5/21, went to ED, received CT scan, but did not stay for treatment or results due to wait.  Experiencing tailbone pain today and mild headache.  Triaged to UC, patient verbalizes that she will go. Copied from CRM [id number]. Topic: Clinical - Red Word Triage >> Nov 08, 2023 10:04 AM Juliana Ocean wrote: Red Word that prompted transfer to Nurse Triage: pt fell down flight of stairs yesterday.  Went to ED, LWBS. Reason for Disposition  [1] SEVERE pain (e.g., excruciating) AND [2] not improved 2 hours after pain medicine/ice packs  Answer Assessment - Initial Assessment Questions 1. MECHANISM: "How did the injury happen?" (Consider the possibility of domestic violence or elder abuse)     Fell downstairs, 13 steps 2. ONSET: "When did the injury happen?" (Minutes or hours ago)     Yesterday, 3. LOCATION: "What part of the back is injured?"     Tail bone 4. SEVERITY: "Can you move the back normally?"     yes 5. PAIN: "Is there any pain?" If Yes, ask: "How bad is the pain?"   (Scale 1-10; or mild, moderate, severe)     10 6. CORD SYMPTOMS: Any weakness or numbness of the arms or legs?"     no 7. SIZE: For cuts, bruises, or swelling, ask: "How large is it?" (e.g., inches or centimeters)     no 8. TETANUS: For any breaks in the skin, ask: "When was the last tetanus booster?"     N/a 9. OTHER SYMPTOMS: "Do you have any other symptoms?" (e.g., abdomen pain, blood in urine)     Head ache 10. PREGNANCY: "Is there any chance you are pregnant?" "When was your last menstrual period?"       no  Protocols used: Back  Injury-A-AH

## 2023-11-08 NOTE — ED Triage Notes (Signed)
 Pt c/o headache and coccyx pain after sliding down 13 stairs on her bottom around 1200 yesterday.

## 2023-11-08 NOTE — ED Triage Notes (Signed)
 Patient arrives POV with complaints of worsening sacral pain related to a mechanical fall down her stairs. She was seen at [facility] ED overnight for the same, but left due to the wait times. Patient did have imaging prior to leaving.

## 2023-11-08 NOTE — Telephone Encounter (Signed)
 Spoke to pt and she advised she was going to the UC. Pt will follow up with us  after her visit. KH

## 2023-11-08 NOTE — ED Notes (Signed)
 D/c instructions, follow-up care, and medications reviewed with patient, pt verbalized understanding and had no further questions at time of d/c. Pt CA&Ox4, ambulatory, and in NAD at time of d/c

## 2023-11-08 NOTE — Discharge Instructions (Addendum)
 You are seen today for fall.  Your physical exam was remarkable for some bony tenderness that warranted a CT today.  Your CT scans do show an indeterminate age coccygeal fracture which I believe is probably from your fall.  Recommend continue to use ice, heat, ibuprofen  and Tylenol  for pain management.  Be sure to also get pillows to help support.  You can also use lidocaine  patches over-the-counter to help with pain.  Take Tylenol  (acetominophen)  650mg  every 4-6 hours, as needed for pain or fever. Do not take more than 4,000 mg in a 24-hour period. As this may cause liver damage. While this is rare, if you begin to develop yellowing of the skin or eyes, stop taking and return to ER immediately.  Take Ibuprofen  400mg  every 4-6 hours for pain or fever, not exceeding 3,200 mg per day as more than 3,200mg  can cause Stomach irritation, dizziness, kidney issues with long-term use.  If you are having to breast-feed, recommend using Tylenol  as the main pain control. Follow-up with PCP if pain remains uncontrolled.

## 2023-11-08 NOTE — ED Notes (Signed)
 No answer when called for room. Pt no where in the lobby or bathroom

## 2023-11-13 ENCOUNTER — Telehealth: Payer: Self-pay

## 2023-11-13 NOTE — Transitions of Care (Post Inpatient/ED Visit) (Signed)
   11/13/2023  Name: Natasha Beard MRN: [id number] DOB: [birth year]  Today's TOC FU Call Status: Today's TOC FU Call Status:: Unsuccessful Call (1st Attempt)  Attempted to reach the patient regarding the most recent Inpatient/ED visit.  Follow Up Plan: Additional outreach attempts will be made to reach the patient to complete the Transitions of Care (Post Inpatient/ED visit) call.   Signature  [employer], [place]

## 2023-11-13 NOTE — Telephone Encounter (Signed)
 Returned call, will wait for return call

## 2023-11-15 ENCOUNTER — Encounter: Payer: Self-pay | Admitting: Lactation Services

## 2023-11-16 ENCOUNTER — Other Ambulatory Visit: Payer: Self-pay

## 2023-11-16 ENCOUNTER — Encounter: Payer: Self-pay | Admitting: Allergy

## 2023-11-16 ENCOUNTER — Telehealth: Payer: Self-pay

## 2023-11-16 ENCOUNTER — Ambulatory Visit (INDEPENDENT_AMBULATORY_CARE_PROVIDER_SITE_OTHER): Payer: Self-pay | Admitting: Allergy

## 2023-11-16 DIAGNOSIS — L509 Urticaria, unspecified: Secondary | ICD-10-CM | POA: Diagnosis not present

## 2023-11-16 DIAGNOSIS — L308 Other specified dermatitis: Secondary | ICD-10-CM

## 2023-11-16 DIAGNOSIS — B369 Superficial mycosis, unspecified: Secondary | ICD-10-CM | POA: Diagnosis not present

## 2023-11-16 MED ORDER — CLOBETASOL PROPIONATE 0.05 % EX OINT: 1.0000 | TOPICAL_OINTMENT | Freq: Two times a day (BID) | CUTANEOUS | 5 refills | Status: DC | PRN

## 2023-11-16 NOTE — Progress Notes (Signed)
 New Patient Note  RE: Natasha Beard MRN: [id number] DOB: 11/20/1995 Date of Office Visit: 11/16/2023  Primary care provider: Paseda, Folashade R, FNP  Chief Complaint: Rash  History of present illness: Natasha Beard is a 28 y.o. female presenting today for evaluation of rash. Discussed the use of AI scribe software for clinical note transcription with the patient, who gave verbal consent to proceed.  She has been experiencing rashes and itching that began at the beginning of the month. The rashes appear as patches on her shoulder, arm or really anywhere on the body as well as under her breast. They are itchy and persistent despite treatment.  She has been using triamcinolone  cream on the patches but switched to a combination cream of clotrimazole  and betamethasone  after this was prescribed from a doctor's visit, which she finds more effective, although she needs to apply it multiple times for relief.  Her whole body was itchy prior to the appearance of the rashes. She suspects that changes in her diet, such as consuming chicken or certain fruits, might be contributing to her symptoms. She has noticed that eating chicken or mixed fruits can trigger itching and rash development. She has a history of eczema, which typically flares in the creases behind her knees and elbows.  She is currently breastfeeding her five-month-old baby and has experienced various issues post-pregnancy, which she attributes to possible hormonal changes. She works from home and tries to stay moisturized to manage her skin condition. She has avoided pineapples and kiwis recently due to immediate reactions like hives or rashes upon consumption. 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     Review of systems: 10pt ROS negative unless noted above in HPI  Past medical history: Past Medical History:  Diagnosis Date   Allergy    Apples, milk chocolate   Anxiety    Arthritis     Depression    Eczema     Past surgical history: Past Surgical History:  Procedure Laterality Date   NO PAST SURGERIES      Family history:  Family History  Problem Relation Age of Onset   Healthy Mother    Anemia Mother    Diabetes Father    Seizures Father    Breast cancer Maternal Aunt    Breast cancer Maternal Grandmother     Social history: Lives in a apartment with carpeting with electric heating and window cooling.  Dog in the home.  No concern for water damage, mildew or roaches in the home.  Denies smoking history.    Medication List: Current Outpatient Medications  Medication Sig Dispense Refill   clotrimazole -betamethasone  (LOTRISONE ) cream Apply 1 Application topically daily. 30 g 0   Prenatal Vit-Fe Phos-FA-Omega (VITAFOL  GUMMIES) 3.33-0.333-34.8 MG CHEW Chew 3 tablets by mouth daily.     triamcinolone  cream (KENALOG ) 0.1 % Apply 1 Application topically 2 (two) times daily. 30 g 0   No current facility-administered medications for this visit.    Known medication allergies: Allergies  Allergen Reactions   Apple Juice     Patient allergic to Apple   Chocolate     In moderation   Erythromycin  Swelling     Physical examination: Blood pressure 110/68, pulse 96, temperature 98.2 F (36.8 C), resp. rate 16, height 5\' 6"  (1.676 m), weight 200 lb 4.8 oz (90.9 kg), SpO2 96%, currently breastfeeding.  General: Alert, interactive, in no acute distress. HEENT: PERRLA, TMs pearly gray, turbinates non-edematous without  discharge, post-pharynx non erythematous. Neck: Supple without lymphadenopathy. Lungs: Clear to auscultation without wheezing, rhonchi or rales. {no increased work of breathing. CV: Normal S1, S2 without murmurs. Abdomen: Nondistended, nontender. Skin: Scattered hyperpigmented round appearing patches with some scaling on the left shoulder, left forearm. Extremities:  No clubbing, cyanosis or edema. Neuro:   Grossly  intact.  Diagnostics/Labs: None today  Assessment and plan:   Eczema, nummular Yeast infection (under breast) Hives, allergic  New onset of nummular eczema possibly related to postpartum hormonal changes and other potential triggers/allergens. Potential triggers include certain foods and environmental factors as well as possible contact allergens.  Skin testing for IgE-mediated allergies is preferred initially. - Prescribe strong topical corticosteroid for air-exposed patches -- Clobetasol  ointment twice a day as needed.  Use this on body below neck only.  - Use Triamcinolone  ointment twice a day on milder rash areas.  This is a mid-strength steroid ointment.  - Use Clotrimazole -betamethasone  ointment for rash/irritation/redness under breast.  This has an antifungal and steroid component.   Keep area under breast dry.  - Continue moisturizing after bathing. - Discuss potential next steps if topical therapies fail, including injectable and oral medications. - Schedule skin testing for environmental and food allergens, avoid antihistamines for three days prior. - Consider patch testing for contact allergies.  Patch testing is the test of choice to evaluate for contact dermatitis.  Patches are best placed on a Monday with return to office on Wednesday and Friday of same week for readings.  Once patches are in place to do not get them wet.  You can take antihistamines while patches are in place.   Schedule skin testing visit Routine follow-up in 3-4 months or sooner if needed  I appreciate the opportunity to take part in [place] care. Please do not hesitate to contact me with questions.  Sincerely,   Catha Clink, MD Allergy/Immunology [facility]

## 2023-11-16 NOTE — Transitions of Care (Post Inpatient/ED Visit) (Signed)
   11/16/2023  Name: Natasha Beard MRN: [id number] DOB: [birth year]  Today's TOC FU Call Status:   Patient's Name and Date of Birth confirmed.  Transition Care Management Follow-up Telephone Call Date of Discharge: 11/08/23 Discharge Facility: [facility] ([facility]) Type of Discharge: Emergency Department Reason for ED Visit: Orthopedic Conditions Orthopedic/Injury Diagnosis: Fracture How have you been since you were released from the hospital?: Better Any questions or concerns?: No  Items Reviewed: Did you receive and understand the discharge instructions provided?: No Medications obtained,verified, and reconciled?: Yes (Medications Reviewed) Any new allergies since your discharge?: No Dietary orders reviewed?: No Do you have support at home?: Yes People in Home [RPT]: spouse Name of Support/Comfort Primary Source: Natasha Beard  Medications Reviewed Today: Medications Reviewed Today     Reviewed by Natasha Beard, CMA (Certified Medical Assistant) on 11/16/23 at 1101  Med List Status: <None>   Medication Order Taking? Sig Documenting Provider Last Dose Status Informant  clotrimazole -betamethasone  (LOTRISONE ) cream [id number] Yes Apply 1 Application topically daily. Natasha Morel, NP Taking Active   Prenatal Vit-Fe Phos-FA-Omega (VITAFOL  GUMMIES) 3.33-0.333-34.8 MG CHEW [id number] Yes Chew 3 tablets by mouth daily. [provider] Taking Active   triamcinolone  cream (KENALOG ) 0.1 % [id number] Yes Apply 1 Application topically 2 (two) times daily. Beard, Natasha R, FNP Taking Active             Home Care and Equipment/Supplies: Were Home Health Services Ordered?: No Any new equipment or medical supplies ordered?: No  Functional Questionnaire: Do you need assistance with bathing/showering or dressing?: No Do you need assistance with meal preparation?: No Do you need assistance with eating?: No Do you have difficulty maintaining continence: No Do you need  assistance with getting out of bed/getting out of a chair/moving?: No Do you have difficulty managing or taking your medications?: No  Follow up appointments reviewed: PCP Follow-up appointment confirmed?: Yes Date of PCP follow-up appointment?: 11/23/23 Follow-up Provider: Abbey Beard Specialist [facility] Follow-up appointment confirmed?: NA Do you need transportation to your follow-up appointment?: No Do you understand care options if your condition(s) worsen?: Yes-patient verbalized understanding    SIGNATURE Lazer Wollard, RMA

## 2023-11-16 NOTE — Patient Instructions (Addendum)
 Eczema, nummular Yeast infection (under breast) Hives, allergic  New onset of nummular eczema possibly related to postpartum hormonal changes and other potential triggers/allergens. Potential triggers include certain foods and environmental factors as well as possible contact allergens.  Skin testing for IgE-mediated allergies is preferred initially. - Prescribe strong topical corticosteroid for air-exposed patches -- Clobetasol ointment twice a day as needed.  Use this on body below neck only.  - Use Triamcinolone  ointment twice a day on milder rash areas.  This is a mid-strength steroid ointment.  - Use Clotrimazole -betamethasone  ointment for rash/irritation/redness under breast.  This has an antifungal and steroid component.   Keep area under breast dry.  - Continue moisturizing after bathing. - Discuss potential next steps if topical therapies fail, including injectable and oral medications. - Schedule skin testing for environmental and food allergens, avoid antihistamines for three days prior. - Consider patch testing for contact allergies.  Patch testing is the test of choice to evaluate for contact dermatitis.  Patches are best placed on a Monday with return to office on Wednesday and Friday of same week for readings.  Once patches are in place to do not get them wet.  You can take antihistamines while patches are in place.   Schedule skin testing visit Routine follow-up in 3-4 months or sooner if needed

## 2023-11-23 ENCOUNTER — Ambulatory Visit: Admitting: Allergy

## 2023-11-23 ENCOUNTER — Encounter: Payer: Self-pay | Admitting: Nurse Practitioner

## 2023-11-23 ENCOUNTER — Ambulatory Visit: Payer: Self-pay | Admitting: Nurse Practitioner

## 2023-11-23 ENCOUNTER — Encounter: Payer: Self-pay | Admitting: Allergy

## 2023-11-23 VITALS — BP 113/70 | HR 90 | Temp 98.4°F | Resp 16 | Ht 66.75 in | Wt 204.0 lb

## 2023-11-23 DIAGNOSIS — L308 Other specified dermatitis: Secondary | ICD-10-CM | POA: Diagnosis not present

## 2023-11-23 DIAGNOSIS — S322XXD Fracture of coccyx, subsequent encounter for fracture with routine healing: Secondary | ICD-10-CM | POA: Diagnosis not present

## 2023-11-23 DIAGNOSIS — L509 Urticaria, unspecified: Secondary | ICD-10-CM | POA: Diagnosis not present

## 2023-11-23 NOTE — Progress Notes (Signed)
 Subjective   Patient ID: Natasha Beard, female    DOB: 04/20/1996, 28 y.o.   MRN: [id number]  Chief Complaint  Patient presents with   Medical Management of Chronic Issues    Referring provider: Paseda, Folashade R, FNP  Natasha Beard is a 28 y.o. female with Past Medical History: No date: Allergy     Comment:  Apples, milk chocolate No date: Anxiety No date: Arthritis No date: Depression No date: Eczema   HPI  Patient presents today for an ED follow-up.  She was seen in the ED on 11/08/2023 for a fall.  CT scan of head and neck were negative.  She was given Toradol  in the ED. Imaging did show displaced distal coccygeal segment. States that she is improving. Taking tylenol  for pain because she is breast feeding. Denies f/c/s, n/v/d, hemoptysis, PND, leg swelling Denies chest pain or edema.     Allergies  Allergen Reactions   Apple Juice     Patient allergic to Apple   Chocolate     In moderation   Erythromycin  Swelling    Immunization History  Administered Date(s) Administered   DTaP 05/29/2001, 08/15/2001   HPV Quadrivalent 01/08/2008, 08/02/2010, 02/16/2012   Hepatitis A, Ped/Adol-2 Dose 01/08/2008   Hepatitis B, PED/ADOLESCENT 05/29/2001, 08/15/2001, 02/04/2002   IPV 05/29/2001, 08/15/2001, 02/04/2002   Influenza, Seasonal, Injecte, Preservative Fre 04/04/2023   MMR 05/29/2001, 08/15/2001   Meningococcal Conjugate 08/02/2010   Rsv, Bivalent, Protein Subunit Rsvpref,pf Natasha Beard) 05/16/2023   Td 03/13/2002, 02/12/2003   Tdap 01/08/2008, 03/07/2023   Varicella 05/29/2001, 01/08/2008    Tobacco History: Social History   Tobacco Use  Smoking Status Never   Passive exposure: Past  Smokeless Tobacco Never   Counseling given: Not Answered   Outpatient Encounter Medications as of 11/23/2023  Medication Sig   clobetasol  ointment (TEMOVATE ) 0.05 % Apply 1 Application topically 2 (two) times daily as needed (rash on body, do not use on face).    clotrimazole -betamethasone  (LOTRISONE ) cream Apply 1 Application topically daily.   triamcinolone  cream (KENALOG ) 0.1 % Apply 1 Application topically 2 (two) times daily.   Prenatal Vit-Fe Phos-FA-Omega (VITAFOL  GUMMIES) 3.33-0.333-34.8 MG CHEW Chew 3 tablets by mouth daily. (Patient not taking: Reported on 11/23/2023)   No facility-administered encounter medications on file as of 11/23/2023.    Review of Systems  Review of Systems  Constitutional: Negative.   HENT: Negative.    Cardiovascular: Negative.   Gastrointestinal: Negative.   Allergic/Immunologic: Negative.   Neurological: Negative.   Psychiatric/Behavioral: Negative.       Objective:   BP 113/70   Pulse 90   Temp 98.4 F (36.9 C) (Oral)   Resp 16   Ht 5' 6.75" (1.695 m)   Wt 204 lb (92.5 kg)   SpO2 98%   BMI 32.19 kg/m   Wt Readings from Last 5 Encounters:  11/23/23 204 lb (92.5 kg)  11/16/23 200 lb 4.8 oz (90.9 kg)  11/08/23 196 lb 3.4 oz (89 kg)  11/08/23 196 lb 3.4 oz (89 kg)  10/18/23 197 lb (89.4 kg)     Physical Exam Vitals and nursing note reviewed.  Constitutional:      General: She is not in acute distress.    Appearance: She is well-developed.  Cardiovascular:     Rate and Rhythm: Normal rate and regular rhythm.  Pulmonary:     Effort: Pulmonary effort is normal.     Breath sounds: Normal breath sounds.  Neurological:     Mental Status:  She is alert and oriented to person, place, and time.       Assessment & Plan:   Closed fracture of coccyx with routine healing, subsequent encounter   Recommend conservative measures  Return if symptoms worsen or fail to improve.     Jerrlyn Morel, NP 11/23/2023

## 2023-11-23 NOTE — Patient Instructions (Signed)
 Tailbone Injury  A tailbone injury is an injury on the small bone at the lower end of the backbone (spine). The injury can happen if the tailbone is bruised, the ligaments are stretched, or the bone is broken (fracture). What are the causes? A falling and landing on the tailbone. Strain or friction on the tailbone. This comes from sitting for long periods of time, or rowing or biking. Giving birth. What are the signs or symptoms? Pain in the tailbone area or lower back. Pain or difficulty when standing up from a sitting position. Bruising or swelling in the tailbone area. Pain when pooping. In females, pain during sex. How is this treated? Most tailbone injuries get better on their own in 4-6 weeks. If you need treatment, it may include: Taking medicines for pain. Using a rubber or inflated ring or cushion when sitting. Doing physical therapy. Getting shots (injections) of local anesthesia and steroid medicine. Follow these instructions at home: Activity Rest as told by your doctor. Do not sit for a long time without moving. Get up to take short walks every 1 to 2 hours. Ask for help if you feel weak or unsteady. Wear proper pads and gear when riding a bike or rowing. Do exercises as told by your doctor or physical therapist. Increase your activity as the pain allows. Return to your normal activities when your doctor says that it is safe. Managing pain, stiffness, and swelling To lessen pain: Sit on a large rubber or inflated ring or cushion. Lean forward when you sit. If told, put ice on the injured area. To do this: Put ice in a plastic bag. Place a towel between your skin and the bag. Leave the ice on for 20 minutes, 2-3 times per day. Do this for the first 1-2 days. If your skin turns bright red, take off the ice right away to prevent skin damage. The risk of skin damage is higher if you cannot feel pain, heat, or cold. If told, put heat on the injured area. Do this as often  as told by your doctor. Use the heat source that your doctor recommends, such as a moist heat pack or a heating pad. Place a towel between your skin and the heat source. Leave the heat on for 20-30 minutes. If your skin turns bright red, take off the heat right away to prevent burns. The risk of burns is higher if you cannot feel pain, heat, or cold. General instructions Take over-the-counter and prescription medicines only as told by your doctor. You may need to take these actions to prevent or treat trouble pooping (constipation) or pain when pooping: Drink enough fluid to keep your pee (urine) pale yellow. Take over-the-counter or prescription medicines. Eat foods that are high in fiber. These include beans, whole grains, and fresh fruits and vegetables. Limit foods that are high in fat and sugar. These include fried or sweet foods. Contact a doctor if: Your pain gets worse. Your pain is not controlled with medicines. Pooping causes you pain. You cannot poop after 4 days. You have pain during sex. Summary A tailbone injury is an injury on the small bone at the lower end of the backbone (spine). These injuries can be painful. Most tailbone injuries get better on their own in 4-6 weeks. Sit on a large rubber or inflated ring or cushion to lessen pain. Do not sit for a long time without moving. Follow your doctor's suggestions to prevent or treat trouble pooping. This information is not  intended to replace advice given to you by your health care provider. Make sure you discuss any questions you have with your health care provider. Document Revised: 09/13/2021 Document Reviewed: 09/13/2021 Elsevier Patient Education  2024 [employer].

## 2023-11-23 NOTE — Patient Instructions (Addendum)
 Eczema, nummular Yeast infection (under breast) Hives, allergic  New onset of nummular eczema possibly related to postpartum hormonal changes and other potential triggers/allergens. Potential triggers include certain foods and environmental factors as well as possible contact allergens.   - Use strong topical corticosteroid for air-exposed patches -- Clobetasol  ointment twice a day as needed.  Use this on body below neck only.  - Use Triamcinolone  ointment twice a day on milder rash areas.  This is a mid-strength steroid ointment.  - Use Clotrimazole -betamethasone  ointment for rash/irritation/redness under breast.  This has an antifungal and steroid component.   Keep area under breast dry.  - Continue moisturizing after bathing. - Potential next steps if topical therapies fail include injectable and oral biologic medications (like Dupixent, Adbry, Ebglyss, Nemluvio, Rinvoq, Cinbinqo) - Consider patch testing for contact allergies.  Patch testing is the test of choice to evaluate for contact dermatitis.  Patches are best placed on a Monday with return to office on Wednesday and Friday of same week for readings.  Once patches are in place to do not get them wet.  You can take antihistamines while patches are in place.   - Environmental allergy skin testing today showed: grasses, weeds, trees, indoor molds, and dust mites -Select food allergy skin testing today is negative.  You do not have IgE mediated food allergy. (See below different food issue categories) - Copy of test results provided.  - Avoidance measures provided.    Food Allergy: food allergy is when you have eaten a food, developed an allergic reaction after eating the food and have IgE to the food (positive food testing either by skin testing or blood testing).  Food allergy could lead to life threatening symptoms  Food Sensitivity: occurs when you have IgE to a food (positive food testing either by skin testing or blood testing) but is  a food you eat without any issues.  This is not an allergy and we recommend keeping the food in the diet  Food Intolerance: this is when you have negative testing by either skin testing or blood testing thus not allergic but the food causes symptoms (like belly pain, bloating, diarrhea etc) with ingestion.  These foods should be avoided to prevent symptoms.      Routine follow-up in 3-4 months or sooner if needed

## 2023-11-23 NOTE — Progress Notes (Signed)
 Follow-up Note  RE: Natasha Beard MRN: [id number] DOB: Aug 02, 1995 Date of Office Visit: 11/23/2023   History of present illness: Natasha Beard is a 28 y.o. female presenting today for skin testing visit.  She was last seen in the office on 11/16/23 for nummular eczema, urticaria, yeast infection.  She is in her usual state of health today without recent illness.  She has held antihistamines for at least 3 days for testing today.   Medication List: Current Outpatient Medications  Medication Sig Dispense Refill   clobetasol  ointment (TEMOVATE ) 0.05 % Apply 1 Application topically 2 (two) times daily as needed (rash on body, do not use on face). 60 g 5   clotrimazole -betamethasone  (LOTRISONE ) cream Apply 1 Application topically daily. 30 g 0   Prenatal Vit-Fe Phos-FA-Omega (VITAFOL  GUMMIES) 3.33-0.333-34.8 MG CHEW Chew 3 tablets by mouth daily.     triamcinolone  cream (KENALOG ) 0.1 % Apply 1 Application topically 2 (two) times daily. 30 g 0   No current facility-administered medications for this visit.     Known medication allergies: Allergies  Allergen Reactions   Apple Juice     Patient allergic to Apple   Chocolate     In moderation   Erythromycin  Swelling   Diagnostics/Labs: Allergy testing:   Airborne Adult Perc - 11/23/23 0901     Time Antigen Placed 0901    Allergen Manufacturer Floyd Hutchinson    Location Back    Number of Test 55    1. Control-Buffer 50% Glycerol Negative    2. Control-Histamine 2+    3. Bahia Negative    4. French Southern Territ[place] Negative    5. Johnson 2+    6. [place]  Blue Negative    7. Meadow Fescue Negative    8. Perennial Rye Negative    9. Timothy Negative    10. Ragweed Mix Negative    11. Cocklebur Negative    12. Plantain,  English Negative    13. Baccharis 2+    14. Dog Fennel Negative    15. Russian Thistle Negative    16. Lamb's Quarters Negative    17. Sheep Sorrell Negative    18. Rough Pigweed 2+    19. Marsh Elder, Rough Negative    20. Mugwort,  Common Negative    21. Box, Elder 2+    22. Cedar, red Negative    23. Sweet Gum Negative    24. Pecan Pollen Negative    25. Pine Mix Negative    26. Walnut, Black Pollen Negative    27. Red Mulberry Negative    28. Ash Mix Negative    29. Birch Mix Negative    30. Beech American Negative    31. Cottonwood, [place] 2+    32. Hickory, White Negative    33. Maple Mix Negative    34. Oak, [place] Mix 2+    35. Sycamore Eastern Negative    36. Alternaria Alternata Negative    37. Cladosporium Herbarum Negative    38. Aspergillus Mix 2+    39. Penicillium Mix 2+    40. Bipolaris Sorokiniana (Helminthosporium) Negative    41. Drechslera Spicifera (Curvularia) Negative    42. Mucor Plumbeus Negative    43. Fusarium Moniliforme Negative    44. Aureobasidium Pullulans (pullulara) Negative    45. Rhizopus Oryzae Negative    46. Botrytis Cinera Negative    47. Epicoccum Nigrum Negative    48. Phoma Betae Negative    49. Dust Mite Mix 2+  50. Cat Hair 10,000 BAU/ml Negative    51.  Dog Epithelia Negative    52. Mixed Feathers Negative    53. Horse Epithelia Negative    54. Cockroach, German Negative    55. Tobacco Leaf Negative             Food Adult Perc - 11/23/23 0900     Time Antigen Placed 0981    Allergen Manufacturer Floyd Hutchinson    Location Back     Control-buffer 50% Glycerol Negative    Control-Histamine 2+    34. Chicken Meat Negative    35. Pork Negative    57. Banana Negative    58. Apple Negative    60. Strawberry Negative    65. Pineapple Negative    1. Other Negative   Blueberry            Allergy testing results were read and interpreted by provider, documented by clinical staff.   Assessment and plan:   Eczema, nummular Yeast infection (under breast) Hives, allergic  New onset of nummular eczema possibly related to postpartum hormonal changes and other potential triggers/allergens. Potential triggers include certain foods and environmental  factors as well as possible contact allergens.   - Use strong topical corticosteroid for air-exposed patches -- Clobetasol  ointment twice a day as needed.  Use this on body below neck only.  - Use Triamcinolone  ointment twice a day on milder rash areas.  This is a mid-strength steroid ointment.  - Use Clotrimazole -betamethasone  ointment for rash/irritation/redness under breast.  This has an antifungal and steroid component.   Keep area under breast dry.  - Continue moisturizing after bathing. - Potential next steps if topical therapies fail include injectable and oral biologic medications (like Dupixent, Adbry, Ebglyss, Nemluvio, Rinvoq, Cinbinqo) - Consider patch testing for contact allergies.  Patch testing is the test of choice to evaluate for contact dermatitis.  Patches are best placed on a Monday with return to office on Wednesday and Friday of same week for readings.  Once patches are in place to do not get them wet.  You can take antihistamines while patches are in place.   - Environmental allergy skin testing today showed: grasses, weeds, trees, indoor molds, and dust mites -Select food allergy skin testing today is negative.  You do not have IgE mediated food allergy. (See below different food issue categories) - Copy of test results provided.  - Avoidance measures provided.    Food Allergy: food allergy is when you have eaten a food, developed an allergic reaction after eating the food and have IgE to the food (positive food testing either by skin testing or blood testing).  Food allergy could lead to life threatening symptoms  Food Sensitivity: occurs when you have IgE to a food (positive food testing either by skin testing or blood testing) but is a food you eat without any issues.  This is not an allergy and we recommend keeping the food in the diet  Food Intolerance: this is when you have negative testing by either skin testing or blood testing thus not allergic but the food causes  symptoms (like belly pain, bloating, diarrhea etc) with ingestion.  These foods should be avoided to prevent symptoms.     Routine follow-up in 3-4 months or sooner if needed  I appreciate the opportunity to take part in [place] care. Please do not hesitate to contact me with questions.  Sincerely,   Catha Clink, MD Allergy/Immunology [facility]

## 2023-12-20 ENCOUNTER — Ambulatory Visit

## 2024-01-01 ENCOUNTER — Other Ambulatory Visit: Payer: Self-pay | Admitting: Lactation Services

## 2024-01-01 MED ORDER — DICLOXACILLIN SODIUM 500 MG PO CAPS: 500.0000 mg | ORAL_CAPSULE | Freq: Four times a day (QID) | ORAL | 0 refills | Status: DC

## 2024-01-01 NOTE — Progress Notes (Signed)
 Dicloxicillin sent to Pharmacy per verbal order from Dr. Nicholaus  for painful breast with flu like symptoms x 2 days. Lumps noted in breast. Mastitis treatment shared with patient via Mychart.   Reena, RN

## 2024-01-15 NOTE — Telephone Encounter (Signed)
 Pt was made an appointment for tomorrow with Bascom. KH

## 2024-01-16 ENCOUNTER — Ambulatory Visit: Payer: Self-pay

## 2024-01-16 ENCOUNTER — Ambulatory Visit: Payer: Self-pay | Admitting: Nurse Practitioner

## 2024-01-16 NOTE — Telephone Encounter (Signed)
 FYI Only or Action Required?: FYI only for provider.  Patient was last seen in primary care on 11/23/2023 by Oley Bascom RAMAN, NP.  Called Nurse Triage reporting Pain.  Symptoms began x few weeks.  Interventions attempted: Nothing.  Symptoms are: gradually worsening.  Triage Disposition: See PCP When Office is Open (Within 3 Days)  Patient/caregiver understands and will follow disposition?: Yes     Copied from CRM #[id number]. Topic: Clinical - Red Word Triage >> Jan 16, 2024 11:27 AM Treva T wrote: Red Word that prompted transfer to Nurse Triage: Patient calling reports she is having right and left knee pain, also left arm and hand pain. Reason for Disposition  [1] MODERATE pain (e.g., interferes with normal activities) AND [2] present > 3 days  [1] MODERATE pain (e.g., interferes with normal activities, limping) AND [2] present > 3 days  Answer Assessment - Initial Assessment Questions 1. ONSET: When did the pain start?      X few days 2. LOCATION: Where is the pain located?      Right & left knee , left arm & hand 3. PAIN: How bad is the pain?    (Scale 1-10; or mild, moderate, severe)     10/10 4. WORK OR EXERCISE: Has there been any recent work or exercise that involved this part of the body?      no 5. CAUSE: What do you think is causing the leg pain?     unknown 6. OTHER SYMPTOMS: Do you have any other symptoms? (e.g., chest pain, back pain, breathing difficulty, swelling, rash, fever, numbness, weakness)     no 7. PREGNANCY: Is there any chance you are pregnant? When was your last menstrual period?     na  Answer Assessment - Initial Assessment Questions 1. ONSET: When did the pain start?     X few weeks 2. LOCATION: Where is the pain located?     left arm and hand pain.  3. PAIN: How bad is the pain? (Scale 0-10; or none, mild, moderate, severe)     10/10 4. WORK OR EXERCISE: Has there been any recent work or exercise that involved this  part of the body?     no 5. CAUSE: What do you think is causing the arm pain?     unknown 6. OTHER SYMPTOMS: Do you have any other symptoms? (e.g., neck pain, swelling, rash, fever, numbness, weakness)     no 7. PREGNANCY: Is there any chance you are pregnant? When was your last menstrual period?     no  Protocols used: Leg Pain-A-AH, Arm Pain-A-AH

## 2024-01-17 ENCOUNTER — Ambulatory Visit (INDEPENDENT_AMBULATORY_CARE_PROVIDER_SITE_OTHER): Payer: Self-pay | Admitting: Nurse Practitioner

## 2024-01-17 VITALS — BP 131/78 | HR 100 | Temp 98.3°F | Wt 216.8 lb

## 2024-01-17 DIAGNOSIS — M25561 Pain in right knee: Secondary | ICD-10-CM

## 2024-01-17 DIAGNOSIS — M25562 Pain in left knee: Secondary | ICD-10-CM | POA: Diagnosis not present

## 2024-01-17 MED ORDER — PREDNISONE 20 MG PO TABS: 20.0000 mg | ORAL_TABLET | Freq: Every day | ORAL | 0 refills | Status: DC

## 2024-01-17 NOTE — Telephone Encounter (Signed)
 Pt was schedule to be seen  today at 3:40 Pm

## 2024-01-17 NOTE — Progress Notes (Signed)
 Subjective   Patient ID: Natasha Beard, female    DOB: 10/17/1995, 28 y.o.   MRN: [id number]  Chief Complaint  Patient presents with   Arm Pain    Leg pain    Referring provider: Oley Bascom RAMAN, NP  Natasha Beard is a 28 y.o. female with Past Medical History: No date: Allergy      Comment:  Apples, milk chocolate No date: Anxiety No date: Arthritis No date: Depression No date: Eczema   HPI  Patient presents today for an acute visit.  She states that she has been having left arm pain and bilateral knee pain for the past 3 to 4 days.  She states that the arm pain has subsided today.  She states that her knees do still hurt.  There is no swelling or fluid.  We will order x-ray.  We will trial prednisone . Denies f/c/s, n/v/d, hemoptysis, PND, leg swelling Denies chest pain or edema      Allergies  Allergen Reactions   Apple Juice     Patient allergic to Apple   Chocolate     In moderation   Erythromycin  Swelling    Immunization History  Administered Date(s) Administered    sv, Bivalent, Protein Subunit Rsvpref,pf [place]) 05/16/2023   DTaP 05/29/2001, 08/15/2001   HPV Quadrivalent 01/08/2008, 08/02/2010, 02/16/2012   Hepatitis A, Ped/Adol-2 Dose 01/08/2008   Hepatitis B, PED/ADOLESCENT 05/29/2001, 08/15/2001, 02/04/2002   IPV 05/29/2001, 08/15/2001, 02/04/2002   Influenza, Seasonal, Injecte, Preservative Fre 04/04/2023   MMR 05/29/2001, 08/15/2001   Meningococcal Conjugate 08/02/2010   Td 03/13/2002, 02/12/2003   Tdap 01/08/2008, 03/07/2023   Varicella 05/29/2001, 01/08/2008    Tobacco History: Social History   Tobacco Use  Smoking Status Never   Passive exposure: Past  Smokeless Tobacco Never   Counseling given: Not Answered   Outpatient Encounter Medications as of 01/17/2024  Medication Sig   predniSONE  (DELTASONE ) 20 MG tablet Take 1 tablet (20 mg total) by mouth daily with breakfast.   clobetasol  ointment (TEMOVATE ) 0.05 % Apply 1 Application  topically 2 (two) times daily as needed (rash on body, do not use on face).   clotrimazole -betamethasone  (LOTRISONE ) cream Apply 1 Application topically daily.   dicloxacillin  (DYNAPEN ) 500 MG capsule Take 1 capsule (500 mg total) by mouth 4 (four) times daily.   Prenatal Vit-Fe Phos-FA-Omega (VITAFOL  GUMMIES) 3.33-0.333-34.8 MG CHEW Chew 3 tablets by mouth daily. (Patient not taking: Reported on 11/23/2023)   triamcinolone  cream (KENALOG ) 0.1 % Apply 1 Application topically 2 (two) times daily.   No facility-administered encounter medications on file as of 01/17/2024.    Review of Systems  Review of Systems  Constitutional: Negative.   HENT: Negative.    Cardiovascular: Negative.   Gastrointestinal: Negative.   Allergic/Immunologic: Negative.   Neurological: Negative.   Psychiatric/Behavioral: Negative.       Objective:   BP 131/78   Pulse 100   Temp 98.3 F (36.8 C) (Oral)   Wt 216 lb 12.8 oz (98.3 kg)   SpO2 95%   BMI 34.21 kg/m   Wt Readings from Last 5 Encounters:  01/17/24 216 lb 12.8 oz (98.3 kg)  11/23/23 204 lb (92.5 kg)  11/16/23 200 lb 4.8 oz (90.9 kg)  11/08/23 196 lb 3.4 oz (89 kg)  11/08/23 196 lb 3.4 oz (89 kg)     Physical Exam Vitals and nursing note reviewed.  Constitutional:      General: She is not in acute distress.    Appearance: She is well-developed.  Cardiovascular:     Rate and Rhythm: Normal rate and regular rhythm.  Pulmonary:     Effort: Pulmonary effort is normal.     Breath sounds: Normal breath sounds.  Musculoskeletal:     Right knee: Decreased range of motion. Tenderness present.     Left knee: Decreased range of motion. Tenderness present.  Neurological:     Mental Status: She is alert and oriented to person, place, and time.       Assessment & Plan:   Acute pain of both knees -     DG Knee Complete 4 Views Right; Future -     DG Knee Complete 4 Views Left; Future  Other orders -     predniSONE ; Take 1 tablet (20 mg  total) by mouth daily with breakfast.  Dispense: 5 tablet; Refill: 0     Return if symptoms worsen or fail to improve.   Bascom GORMAN Borer, NP 01/17/2024

## 2024-01-21 ENCOUNTER — Ambulatory Visit
Admission: RE | Admit: 2024-01-21 | Discharge: 2024-01-21 | Disposition: A | Discharge status: Home / Self Care | Source: Ambulatory Visit | Attending: Nurse Practitioner

## 2024-01-21 DIAGNOSIS — M25561 Pain in right knee: Secondary | ICD-10-CM

## 2024-01-24 ENCOUNTER — Ambulatory Visit: Payer: Self-pay | Admitting: Nurse Practitioner

## 2024-01-24 DIAGNOSIS — M25561 Pain in right knee: Secondary | ICD-10-CM

## 2024-01-24 DIAGNOSIS — M25462 Effusion, left knee: Secondary | ICD-10-CM

## 2024-02-01 ENCOUNTER — Ambulatory Visit (INDEPENDENT_AMBULATORY_CARE_PROVIDER_SITE_OTHER): Admitting: Orthopaedic Surgery

## 2024-02-01 DIAGNOSIS — M25562 Pain in left knee: Secondary | ICD-10-CM | POA: Diagnosis not present

## 2024-02-01 DIAGNOSIS — M25561 Pain in right knee: Secondary | ICD-10-CM

## 2024-02-01 DIAGNOSIS — G8929 Other chronic pain: Secondary | ICD-10-CM

## 2024-02-01 MED ORDER — BUPIVACAINE HCL 0.5 % IJ SOLN
2.0000 mL | INTRAMUSCULAR | Status: AC | PRN
  Administered 2024-02-01: 2 mL via INTRA_ARTICULAR

## 2024-02-01 MED ORDER — LIDOCAINE HCL 1 % IJ SOLN
2.0000 mL | INTRAMUSCULAR | Status: AC | PRN
  Administered 2024-02-01: 2 mL

## 2024-02-01 MED ORDER — METHYLPREDNISOLONE ACETATE 40 MG/ML IJ SUSP
40.0000 mg | INTRAMUSCULAR | Status: AC | PRN
  Administered 2024-02-01: 40 mg via INTRA_ARTICULAR

## 2024-02-01 NOTE — Progress Notes (Signed)
 Office Visit Note   Patient: Natasha Beard           Date of Birth: [birth year]           MRN: [id number] Visit Date: 02/01/2024              Requested by: Oley Bascom RAMAN, NP [phone] [address] [place],  [place] [address] PCP: Oley Bascom RAMAN, NP   Assessment & Plan: Visit Diagnoses:  1. Chronic pain of both knees     Plan: History of Present Illness Natasha Beard is a 28 year old female who presents with left knee swelling and pain post-pregnancy.  She also experiences right knee pain but this is not as bad as the left.  She has experienced left knee swelling since giving birth eight months ago, with worsening symptoms over the past two weeks. There is no recent injury to the left knee. Pain is localized around the kneecap with associated swelling. She experiences clicking, popping, and catching sensations in both knees. She manages pain with Tylenol  due to breastfeeding. Her weight is 260 pounds, the highest she has been, following significant weight gain during pregnancy. She works from home and is currently breastfeeding.  Physical Exam MEASUREMENTS: Weight- 260. MUSCULOSKELETAL: Mild swelling in the left knee.  Otherwise exams of the bilateral knees are normal.  Results RADIOLOGY Knee X-ray: No structural abnormalities, fractures, or dislocations. Presence of joint effusion indicating inflammation.  Assessment and Plan Chronic left greater than right knee pain with swelling and inflammation Postpartum exacerbation with inflammation and fluid presence. No structural abnormalities on x-ray. Possible weight gain contribution. - Administer intra-articular cortisone injection to left knee. - Recommend physical therapy for knee rehabilitation. - Advise consultation with OBGYN or pediatrician regarding Medrol  Dosepak safety while breastfeeding.  Follow-Up Instructions: No follow-ups on file.   Orders:  Orders Placed This Encounter  Procedures   Large Joint Inj   Ambulatory  referral to Physical Therapy   No orders of the defined types were placed in this encounter.     Procedures: Large Joint Inj: L knee on 02/01/2024 12:57 PM Details: 22 G needle Medications: 2 mL bupivacaine  0.5 %; 2 mL lidocaine  1 %; 40 mg methylPREDNISolone  acetate 40 MG/ML Outcome: tolerated well, no immediate complications Patient was prepped and draped in the usual sterile fashion.       Clinical Data: No additional findings.   Subjective: Chief Complaint  Patient presents with   Left Knee - Pain   Right Knee - Pain    HPI  Review of Systems  Constitutional: Negative.   HENT: Negative.    Eyes: Negative.   Respiratory: Negative.    Cardiovascular: Negative.   Endocrine: Negative.   Musculoskeletal: Negative.   Neurological: Negative.   Hematological: Negative.   Psychiatric/Behavioral: Negative.    All other systems reviewed and are negative.    Objective: Vital Signs: There were no vitals taken for this visit.  Physical Exam Vitals and nursing note reviewed.  Constitutional:      Appearance: She is well-developed.  HENT:     Head: Atraumatic.     Nose: Nose normal.  Eyes:     Extraocular Movements: Extraocular movements intact.  Cardiovascular:     Pulses: Normal pulses.  Pulmonary:     Effort: Pulmonary effort is normal.  Abdominal:     Palpations: Abdomen is soft.  Musculoskeletal:     Cervical back: Neck supple.  Skin:    General: Skin is warm.  Capillary Refill: Capillary refill takes less than 2 seconds.  Neurological:     Mental Status: She is alert. Mental status is at baseline.  Psychiatric:        Behavior: Behavior normal.        Thought Content: Thought content normal.        Judgment: Judgment normal.     Ortho Exam  Specialty Comments:  No specialty comments available.  Imaging: No results found.   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   Palpitations 11/23/2022   Past Medical History:  Diagnosis Date   Allergy     Apples, milk chocolate   Anxiety    Arthritis    Depression    Eczema     Family History  Problem Relation Age of Onset   Anemia Mother    Diabetes Father    Seizures Father    Breast cancer Maternal Aunt    Breast cancer Maternal Grandmother     Past Surgical History:  Procedure Laterality Date   NO PAST SURGERIES     Social History   Occupational History   Not on file  Tobacco Use   Smoking status: Never    Passive exposure: Past   Smokeless tobacco: Never  Vaping Use   Vaping status: Never Used  Substance and Sexual Activity   Alcohol use: Not Currently   Drug use: Not Currently    Types: Marijuana    Comment: +UDS 12/11/22 THC   Sexual activity: Yes    Birth control/protection: None

## 2024-02-14 NOTE — Therapy (Signed)
 OUTPATIENT PHYSICAL THERAPY LOWER EXTREMITY EVALUATION   Patient Name: Natasha Beard MRN: [id number] DOB:09-29-1995, 28 y.o., female Today's Date: 02/16/2024  END OF SESSION:  PT End of Session - 02/16/24 1450     Visit Number 1    Number of Visits 13    Date for PT Re-Evaluation 04/05/24    Authorization - Visit Number 1    Authorization - Number of Visits 27    PT Start Time 1106    PT Stop Time 1152    PT Time Calculation (min) 46 min    Activity Tolerance Patient tolerated treatment well    Behavior During Therapy WFL for tasks assessed/performed          Past Medical History:  Diagnosis Date   Allergy     Apples, milk chocolate   Anxiety    Arthritis    Depression    Eczema    Past Surgical History:  Procedure Laterality Date   NO PAST SURGERIES     Patient Active Problem List   Diagnosis Date Noted   Mixed anxiety and depressive disorder 07/13/2023   Eczema 07/13/2023   Migraine, unspecified, not intractable, without status migrainosus 07/13/2023   NSVT (nonsustained ventricular tachycardia) (HCC) 06/27/2023   GBS carrier 05/21/2023   Palpitations 11/23/2022    PCP: Oley Bascom RAMAN, NP   REFERRING PROVIDER: Jerri Kay HERO, MD   REFERRING DIAG: M25.561,M25.562,G89.29 (ICD-10-CM) - Chronic pain of both knees   THERAPY DIAG:  Chronic pain of right knee  Chronic pain of left knee  Muscle weakness (generalized)  Difficulty in walking, not elsewhere classified  Rationale for Evaluation and Treatment: Rehabilitation  ONSET DATE: Over 1 year  SUBJECTIVE:   SUBJECTIVE STATEMENT: Pt reports increased knee pain after weight gain with the birth of a child. Pt also notes in 2019 she sprained her R ankle and it felt like she tore something on the front of her R knee which has continued to be an issue since this injury. She states the injury occurred when she tripped over a pallet when working at [employer]. Pt endorses popping of both knees. Pt states her L knee  was drained and received a cortisone injection.   PERTINENT HISTORY: High BMI  PAIN:  Are you having pain? Yes: NPRS scale: R knee: 4-9/10; L knee: 4-9/10 Pain location: Both anterior knees Pain description: ache, burning , throb, sharp, stiff Aggravating factors: Standing, prolonged walking, squatting, standing up after prolonged sitting Relieving factors: Move around, cold packs  PRECAUTIONS: None  RED FLAGS: None   WEIGHT BEARING RESTRICTIONS: No  FALLS:  Has patient fallen in last 6 months? Yes. Number of falls 1-slipped down steps when rushing  LIVING ENVIRONMENT: Lives with: lives with their family 33 month old child Lives in: House/apartment Stairs: Yes: Internal: 12 steps; on left going up Has following equipment at home: None  OCCUPATION: Office/computer/sitting  PLOF: Independent  PATIENT GOALS: Pain relief  NEXT MD VISIT: Not scheduled  OBJECTIVE:  Note: Objective measures were completed at Evaluation unless otherwise noted.  DIAGNOSTIC FINDINGS:   02/01/24:  L knee SOFT TISSUES: The soft tissues are unremarkable. IMPRESSION: 1. Small joint effusion.  R knee SOFT TISSUES: The soft tissues are unremarkable. IMPRESSION: 1. Negative radiographs of the right knee.  PATIENT SURVEYS:  LEFS: 42/80= 53%  COGNITION: Overall cognitive status: Within functional limits for tasks assessed     SENSATION: WFL  EDEMA:  Min swelling  MUSCLE LENGTH: Hamstrings: Right 45 deg; Left 45 deg Debby  test: Right WNLs deg; Left WNLs deg  POSTURE: increased lumbar lordosis and genu valgum  PALPATION: TTP both patellas; crepitus with knee extension  LOWER EXTREMITY ROM:  Active ROM Right eval Left eval  Hip flexion    Hip extension    Hip abduction    Hip adduction    Hip internal rotation    Hip external rotation    Knee flexion 125 125  Knee extension 0 0  Ankle dorsiflexion    Ankle plantarflexion    Ankle inversion    Ankle eversion      (Blank rows = not tested)  LOWER EXTREMITY MMT:  MMT Right eval Left eval  Hip flexion 4 4  Hip extension    Hip abduction 4 4  Hip adduction    Hip internal rotation    Hip external rotation 4 4  Knee flexion 4+ 4+  Knee extension 4+ 4+  Ankle dorsiflexion    Ankle plantarflexion    Ankle inversion    Ankle eversion     (Blank rows = not tested)  LOWER EXTREMITY SPECIAL TESTS:  Knee special tests: Anterior drawer test: negative, Posterior drawer test: negative, McMurray's test: negative, Patellafemoral apprehension test: negative, and Patellafemoral grind test: negative  FUNCTIONAL TESTS:  5 times sit to stand: 20.8  c use of hands  GAIT: Distance walked: 200' Assistive device utilized: None Level of assistance: Complete Independence Comments: Decreased pace                                                                                                                                TREATMENT DATE: [facility] Adult PT Treatment:                          DATE: 02/15/24 Therapeutic Exercise: Developed, instructed in, and pt completed therex as noted in HEP  Self Care: Use of cold packs with elevation to manage pain and swelling     PATIENT EDUCATION:  Education details: Eval findings, POC, HEP, self care  Person educated: Patient Education method: Explanation, Demonstration, Tactile cues, Verbal cues, and Handouts Education comprehension: verbalized understanding, returned demonstration, verbal cues required, and tactile cues required  HOME EXERCISE PROGRAM: Access Code: [email or web address] URL: [email or web address] Date: 02/15/2024 Prepared by: Dasie Daft  Exercises - Supine Quad Set  - 1-2 x daily - 7 x weekly - 1 sets - 10 reps - 5 hold - Active Straight Leg Raise with Quad Set  - 1-2 x daily - 7 x weekly - 1 sets - 10 reps - 3 hold - Seated Hamstring Stretch  - 2 x daily - 7 x weekly - 1 sets - 3 reps - 30 hold  ASSESSMENT:  CLINICAL IMPRESSION: Patient  is a 28 y.o. female who was seen today for physical therapy evaluation and treatment for M25.561,M25.562,G89.29 (ICD-10-CM) - Chronic pain of both knees. Pt presents in bilat knee and hip weakness.  Pt's 5xSTS time was significantly less than expected level for her age, and pt walks at a decreased pace. Pt will benefit from skilled PT 2w6 to address impairments to optimize bilat knee function with less pain.    OBJECTIVE IMPAIRMENTS: decreased activity tolerance, difficulty walking, decreased strength, pain, and high BMI.   ACTIVITY LIMITATIONS: sitting, standing, squatting, stairs, dressing, hygiene/grooming, locomotion level, and caring for others  PARTICIPATION LIMITATIONS: meal prep, cleaning, laundry, shopping, and community activity  PERSONAL FACTORS: Fitness, Past/current experiences, Time since onset of injury/illness/exacerbation, and 1 comorbidity: high BMI are also affecting patient's functional outcome.   REHAB POTENTIAL: Good  CLINICAL DECISION MAKING: Evolving/moderate complexity  EVALUATION COMPLEXITY: Moderate   GOALS:  SHORT TERM GOALS: Target date: 03/07/24 Pt will be Ind in an initial HEP  Baseline: started Goal status: INITIAL  2.  Pt will report 25% or greater improvement in bilat knee pain for improved function and QOL Baseline: 4-9/10 Goal status: INITIAL  LONG TERM GOALS: Target date: 04/05/24  Pt will be Ind in a final HEP to maintain achieved LOF  Baseline:  Goal status: INITIAL  2.  Pt will report 75% or greater improvement in bilat knee pain for improved function and QOL Baseline:  Goal status: INITIAL  3.  Pt's bilat knee strength will increase to 5/5 and bilat hip strength to 4+/5 for improved function and tolerance to activiity Baseline:  Goal status: INITIAL  4.  Improve 5xSTS by MCID of 5 as indication of improved functional mobility  Baseline: 20.8  c hands Goal status: INITIAL  5.  Pt's LEFS score will improve by the MCID to 68% as  indication of improved function  Baseline: 53% Goal status: INITIAL   PLAN:  PT FREQUENCY: 2x/week  PT DURATION: 6 weeks  PLANNED INTERVENTIONS: 97164- PT Re-evaluation, 97110-Therapeutic exercises, 97530- Therapeutic activity, 97112- Neuromuscular re-education, 97535- Self Care, 02859- Manual therapy, [id number]- Gait training, [phone]- Aquatic Therapy, [phone]- Electrical stimulation (unattended), [phone]- Ionotophoresis 4mg /ml Dexamethasone, 79439 (1-2 muscles), 20561 (3+ muscles)- Dry Needling, Patient/Family education, Stair training, Taping, Joint mobilization, Cryotherapy, and Moist heat  PLAN FOR NEXT SESSION: Assess 5xSTS; assess response to HEP; progress therex as indicated; use of modalities, manual therapy; and TPDN as indicated.   Richard Ritchey MS, PT 02/16/24 9:58 PM

## 2024-02-15 ENCOUNTER — Other Ambulatory Visit: Payer: Self-pay

## 2024-02-15 ENCOUNTER — Ambulatory Visit: Attending: Orthopaedic Surgery

## 2024-02-15 DIAGNOSIS — M25561 Pain in right knee: Secondary | ICD-10-CM | POA: Insufficient documentation

## 2024-02-15 DIAGNOSIS — G8929 Other chronic pain: Secondary | ICD-10-CM | POA: Diagnosis present

## 2024-02-15 DIAGNOSIS — R262 Difficulty in walking, not elsewhere classified: Secondary | ICD-10-CM | POA: Diagnosis present

## 2024-02-15 DIAGNOSIS — M6281 Muscle weakness (generalized): Secondary | ICD-10-CM | POA: Diagnosis present

## 2024-02-15 DIAGNOSIS — M25562 Pain in left knee: Secondary | ICD-10-CM | POA: Insufficient documentation

## 2024-02-20 NOTE — Telephone Encounter (Signed)
 Appt. Schedule for 02/22/24

## 2024-02-22 ENCOUNTER — Ambulatory Visit: Payer: Self-pay | Admitting: Nurse Practitioner

## 2024-03-03 ENCOUNTER — Ambulatory Visit: Admitting: Physical Therapy

## 2024-03-06 ENCOUNTER — Ambulatory Visit: Admitting: Nurse Practitioner

## 2024-03-07 ENCOUNTER — Telehealth: Payer: Self-pay | Admitting: Physical Therapy

## 2024-03-07 ENCOUNTER — Ambulatory Visit: Attending: Orthopaedic Surgery | Admitting: Physical Therapy

## 2024-03-07 NOTE — Telephone Encounter (Signed)
 Left voicemail regarding missed appointment and left next appointment date and time.

## 2024-03-10 NOTE — Therapy (Incomplete)
 OUTPATIENT PHYSICAL THERAPY LOWER EXTREMITY TREATMENT   Patient Name: Natasha Beard MRN: [id number] DOB:March 02, 1996, 28 y.o., female Today's Date: 03/10/2024  END OF SESSION:    Past Medical History:  Diagnosis Date   Allergy     Apples, milk chocolate   Anxiety    Arthritis    Depression    Eczema    Past Surgical History:  Procedure Laterality Date   NO PAST SURGERIES     Patient Active Problem List   Diagnosis Date Noted   Mixed anxiety and depressive disorder 07/13/2023   Eczema 07/13/2023   Migraine, unspecified, not intractable, without status migrainosus 07/13/2023   NSVT (nonsustained ventricular tachycardia) (HCC) 06/27/2023   GBS carrier 05/21/2023   Palpitations 11/23/2022    PCP: Oley Bascom RAMAN, NP   REFERRING PROVIDER: Jerri Kay HERO, MD   REFERRING DIAG: M25.561,M25.562,G89.29 (ICD-10-CM) - Chronic pain of both knees   THERAPY DIAG:  No diagnosis found.  Rationale for Evaluation and Treatment: Rehabilitation  ONSET DATE: Over 1 year  SUBJECTIVE:   SUBJECTIVE STATEMENT: EVAL: Pt reports increased knee pain after weight gain with the birth of a child. Pt also notes in 2019 she sprained her R ankle and it felt like she tore something on the front of her R knee which has continued to be an issue since this injury. She states the injury occurred when she tripped over a pallet when working at [employer]. Pt endorses popping of both knees. Pt states her L knee was drained and received a cortisone injection.   PERTINENT HISTORY: High BMI  PAIN:  Are you having pain? Yes: NPRS scale: R knee: 4-9/10; L knee: 4-9/10 Pain location: Both anterior knees Pain description: ache, burning , throb, sharp, stiff Aggravating factors: Standing, prolonged walking, squatting, standing up after prolonged sitting Relieving factors: Move around, cold packs  PRECAUTIONS: None  RED FLAGS: None   WEIGHT BEARING RESTRICTIONS: No  FALLS:  Has patient fallen in last 6  months? Yes. Number of falls 1-slipped down steps when rushing  LIVING ENVIRONMENT: Lives with: lives with their family 39 month old child Lives in: House/apartment Stairs: Yes: Internal: 12 steps; on left going up Has following equipment at home: None  OCCUPATION: Office/computer/sitting  PLOF: Independent  PATIENT GOALS: Pain relief  NEXT MD VISIT: Not scheduled  OBJECTIVE:  Note: Objective measures were completed at Evaluation unless otherwise noted.  DIAGNOSTIC FINDINGS:   02/01/24:  L knee SOFT TISSUES: The soft tissues are unremarkable. IMPRESSION: 1. Small joint effusion.  R knee SOFT TISSUES: The soft tissues are unremarkable. IMPRESSION: 1. Negative radiographs of the right knee.  PATIENT SURVEYS:  LEFS: 42/80= 53%  COGNITION: Overall cognitive status: Within functional limits for tasks assessed     SENSATION: WFL  EDEMA:  Min swelling  MUSCLE LENGTH: Hamstrings: Right 45 deg; Left 45 deg Thomas test: Right WNLs deg; Left WNLs deg  POSTURE: increased lumbar lordosis and genu valgum  PALPATION: TTP both patellas; crepitus with knee extension  LOWER EXTREMITY ROM:  Active ROM Right eval Left eval  Hip flexion    Hip extension    Hip abduction    Hip adduction    Hip internal rotation    Hip external rotation    Knee flexion 125 125  Knee extension 0 0  Ankle dorsiflexion    Ankle plantarflexion    Ankle inversion    Ankle eversion     (Blank rows = not tested)  LOWER EXTREMITY MMT:  MMT Right eval Left  eval  Hip flexion 4 4  Hip extension    Hip abduction 4 4  Hip adduction    Hip internal rotation    Hip external rotation 4 4  Knee flexion 4+ 4+  Knee extension 4+ 4+  Ankle dorsiflexion    Ankle plantarflexion    Ankle inversion    Ankle eversion     (Blank rows = not tested)  LOWER EXTREMITY SPECIAL TESTS:  Knee special tests: Anterior drawer test: negative, Posterior drawer test: negative, McMurray's test: negative,  Patellafemoral apprehension test: negative, and Patellafemoral grind test: negative  FUNCTIONAL TESTS:  5 times sit to stand: 20.8  c use of hands  GAIT: Distance walked: 200' Assistive device utilized: None Level of assistance: Complete Independence Comments: Decreased pace                                                                                                                                TREATMENT DATE:  [facility] Adult PT Treatment:                                                DATE: 03/11/24 Therapeutic Exercise: *** Manual Therapy: *** Neuromuscular re-ed: *** Therapeutic Activity: *** Modalities: *** Self Care: ***   [facility] Adult PT Treatment:                          DATE: 02/15/24 Therapeutic Exercise: Developed, instructed in, and pt completed therex as noted in HEP  Self Care: Use of cold packs with elevation to manage pain and swelling     PATIENT EDUCATION:  Education details: Eval findings, POC, HEP, self care  Person educated: Patient Education method: Explanation, Demonstration, Tactile cues, Verbal cues, and Handouts Education comprehension: verbalized understanding, returned demonstration, verbal cues required, and tactile cues required  HOME EXERCISE PROGRAM: Access Code: [email or web address] URL: [email or web address] Date: 02/15/2024 Prepared by: Dasie Daft  Exercises - Supine Quad Set  - 1-2 x daily - 7 x weekly - 1 sets - 10 reps - 5 hold - Active Straight Leg Raise with Quad Set  - 1-2 x daily - 7 x weekly - 1 sets - 10 reps - 3 hold - Seated Hamstring Stretch  - 2 x daily - 7 x weekly - 1 sets - 3 reps - 30 hold  ASSESSMENT:  CLINICAL IMPRESSION:   EVAL: Patient is a 28 y.o. female who was seen today for physical therapy evaluation and treatment for M25.561,M25.562,G89.29 (ICD-10-CM) - Chronic pain of both knees. Pt presents in bilat knee and hip weakness. Pt's 5xSTS time was significantly less than expected level for her age, and  pt walks at a decreased pace. Pt will benefit from skilled PT 2w6 to address impairments to optimize bilat knee function with less pain.  OBJECTIVE IMPAIRMENTS: decreased activity tolerance, difficulty walking, decreased strength, pain, and high BMI.   ACTIVITY LIMITATIONS: sitting, standing, squatting, stairs, dressing, hygiene/grooming, locomotion level, and caring for others  PARTICIPATION LIMITATIONS: meal prep, cleaning, laundry, shopping, and community activity  PERSONAL FACTORS: Fitness, Past/current experiences, Time since onset of injury/illness/exacerbation, and 1 comorbidity: high BMI are also affecting patient's functional outcome.   REHAB POTENTIAL: Good  CLINICAL DECISION MAKING: Evolving/moderate complexity  EVALUATION COMPLEXITY: Moderate   GOALS:  SHORT TERM GOALS: Target date: 03/07/24 Pt will be Ind in an initial HEP  Baseline: started Goal status: INITIAL  2.  Pt will report 25% or greater improvement in bilat knee pain for improved function and QOL Baseline: 4-9/10 Goal status: INITIAL  LONG TERM GOALS: Target date: 04/05/24  Pt will be Ind in a final HEP to maintain achieved LOF  Baseline:  Goal status: INITIAL  2.  Pt will report 75% or greater improvement in bilat knee pain for improved function and QOL Baseline:  Goal status: INITIAL  3.  Pt's bilat knee strength will increase to 5/5 and bilat hip strength to 4+/5 for improved function and tolerance to activiity Baseline:  Goal status: INITIAL  4.  Improve 5xSTS by MCID of 5 as indication of improved functional mobility  Baseline: 20.8  c hands Goal status: INITIAL  5.  Pt's LEFS score will improve by the MCID to 68% as indication of improved function  Baseline: 53% Goal status: INITIAL   PLAN:  PT FREQUENCY: 2x/week  PT DURATION: 6 weeks  PLANNED INTERVENTIONS: 97164- PT Re-evaluation, 97110-Therapeutic exercises, 97530- Therapeutic activity, 97112- Neuromuscular re-education,  97535- Self Care, 02859- Manual therapy, [id number]- Gait training, [phone]- Aquatic Therapy, [phone]- Electrical stimulation (unattended), [phone]- Ionotophoresis 4mg /ml Dexamethasone, 79439 (1-2 muscles), 20561 (3+ muscles)- Dry Needling, Patient/Family education, Stair training, Taping, Joint mobilization, Cryotherapy, and Moist heat  PLAN FOR NEXT SESSION: Assess 5xSTS; assess response to HEP; progress therex as indicated; use of modalities, manual therapy; and TPDN as indicated.   Cuong Moorman MS, PT 03/10/24 9:01 PM

## 2024-03-11 ENCOUNTER — Ambulatory Visit

## 2024-03-12 ENCOUNTER — Telehealth: Payer: Self-pay

## 2024-03-12 NOTE — Telephone Encounter (Signed)
 Spoke to pt by phone re: 2nd no show appt. Reminded pt of her next appt and was advised of the attendance policy.

## 2024-03-13 NOTE — Therapy (Incomplete)
 OUTPATIENT PHYSICAL THERAPY LOWER EXTREMITY TREATMENT   Patient Name: Natasha Beard MRN: [id number] DOB:[birth year], 28 y.o., female Today's Date: 03/13/2024  END OF SESSION:    Past Medical History:  Diagnosis Date   Allergy     Apples, milk chocolate   Anxiety    Arthritis    Depression    Eczema    Past Surgical History:  Procedure Laterality Date   NO PAST SURGERIES     Patient Active Problem List   Diagnosis Date Noted   Mixed anxiety and depressive disorder 07/13/2023   Eczema 07/13/2023   Migraine, unspecified, not intractable, without status migrainosus 07/13/2023   NSVT (nonsustained ventricular tachycardia) (HCC) 06/27/2023   GBS carrier 05/21/2023   Palpitations 11/23/2022    PCP: Oley Bascom RAMAN, NP   REFERRING PROVIDER: Jerri Kay HERO, MD   REFERRING DIAG: M25.561,M25.562,G89.29 (ICD-10-CM) - Chronic pain of both knees   THERAPY DIAG:  No diagnosis found.  Rationale for Evaluation and Treatment: Rehabilitation  ONSET DATE: Over 1 year  SUBJECTIVE:   SUBJECTIVE STATEMENT: EVAL: Pt reports increased knee pain after weight gain with the birth of a child. Pt also notes in 2019 she sprained her R ankle and it felt like she tore something on the front of her R knee which has continued to be an issue since this injury. She states the injury occurred when she tripped over a pallet when working at [employer]. Pt endorses popping of both knees. Pt states her L knee was drained and received a cortisone injection.   PERTINENT HISTORY: High BMI  PAIN:  Are you having pain? Yes: NPRS scale: R knee: 4-9/10; L knee: 4-9/10 Pain location: Both anterior knees Pain description: ache, burning , throb, sharp, stiff Aggravating factors: Standing, prolonged walking, squatting, standing up after prolonged sitting Relieving factors: Move around, cold packs  PRECAUTIONS: None  RED FLAGS: None   WEIGHT BEARING RESTRICTIONS: No  FALLS:  Has patient fallen in last 6  months? Yes. Number of falls 1-slipped down steps when rushing  LIVING ENVIRONMENT: Lives with: lives with their family 82 month old child Lives in: House/apartment Stairs: Yes: Internal: 12 steps; on left going up Has following equipment at home: None  OCCUPATION: Office/computer/sitting  PLOF: Independent  PATIENT GOALS: Pain relief  NEXT MD VISIT: Not scheduled  OBJECTIVE:  Note: Objective measures were completed at Evaluation unless otherwise noted.  DIAGNOSTIC FINDINGS:   02/01/24:  L knee SOFT TISSUES: The soft tissues are unremarkable. IMPRESSION: 1. Small joint effusion.  R knee SOFT TISSUES: The soft tissues are unremarkable. IMPRESSION: 1. Negative radiographs of the right knee.  PATIENT SURVEYS:  LEFS: 42/80= 53%  COGNITION: Overall cognitive status: Within functional limits for tasks assessed     SENSATION: WFL  EDEMA:  Min swelling  MUSCLE LENGTH: Hamstrings: Right 45 deg; Left 45 deg Thomas test: Right WNLs deg; Left WNLs deg  POSTURE: increased lumbar lordosis and genu valgum  PALPATION: TTP both patellas; crepitus with knee extension  LOWER EXTREMITY ROM:  Active ROM Right eval Left eval  Hip flexion    Hip extension    Hip abduction    Hip adduction    Hip internal rotation    Hip external rotation    Knee flexion 125 125  Knee extension 0 0  Ankle dorsiflexion    Ankle plantarflexion    Ankle inversion    Ankle eversion     (Blank rows = not tested)  LOWER EXTREMITY MMT:  MMT Right eval Left  eval  Hip flexion 4 4  Hip extension    Hip abduction 4 4  Hip adduction    Hip internal rotation    Hip external rotation 4 4  Knee flexion 4+ 4+  Knee extension 4+ 4+  Ankle dorsiflexion    Ankle plantarflexion    Ankle inversion    Ankle eversion     (Blank rows = not tested)  LOWER EXTREMITY SPECIAL TESTS:  Knee special tests: Anterior drawer test: negative, Posterior drawer test: negative, McMurray's test: negative,  Patellafemoral apprehension test: negative, and Patellafemoral grind test: negative  FUNCTIONAL TESTS:  5 times sit to stand: 20.8  c use of hands  GAIT: Distance walked: 200' Assistive device utilized: None Level of assistance: Complete Independence Comments: Decreased pace                                                                                                                                TREATMENT DATE:  [facility] Adult PT Treatment:                                                DATE: 03/13/24 Therapeutic Exercise: *** Manual Therapy: *** Neuromuscular re-ed: *** Therapeutic Activity: *** Modalities: *** Self Care: ***   [facility] Adult PT Treatment:                          DATE: 02/15/24 Therapeutic Exercise: Developed, instructed in, and pt completed therex as noted in HEP  Self Care: Use of cold packs with elevation to manage pain and swelling     PATIENT EDUCATION:  Education details: Eval findings, POC, HEP, self care  Person educated: Patient Education method: Explanation, Demonstration, Tactile cues, Verbal cues, and Handouts Education comprehension: verbalized understanding, returned demonstration, verbal cues required, and tactile cues required  HOME EXERCISE PROGRAM: Access Code: [email or web address] URL: [email or web address] Date: 02/15/2024 Prepared by: Dasie Daft  Exercises - Supine Quad Set  - 1-2 x daily - 7 x weekly - 1 sets - 10 reps - 5 hold - Active Straight Leg Raise with Quad Set  - 1-2 x daily - 7 x weekly - 1 sets - 10 reps - 3 hold - Seated Hamstring Stretch  - 2 x daily - 7 x weekly - 1 sets - 3 reps - 30 hold  ASSESSMENT:  CLINICAL IMPRESSION:   EVAL: Patient is a 28 y.o. female who was seen today for physical therapy evaluation and treatment for M25.561,M25.562,G89.29 (ICD-10-CM) - Chronic pain of both knees. Pt presents in bilat knee and hip weakness. Pt's 5xSTS time was significantly less than expected level for her age, and  pt walks at a decreased pace. Pt will benefit from skilled PT 2w6 to address impairments to optimize bilat knee function with less pain.  OBJECTIVE IMPAIRMENTS: decreased activity tolerance, difficulty walking, decreased strength, pain, and high BMI.   ACTIVITY LIMITATIONS: sitting, standing, squatting, stairs, dressing, hygiene/grooming, locomotion level, and caring for others  PARTICIPATION LIMITATIONS: meal prep, cleaning, laundry, shopping, and community activity  PERSONAL FACTORS: Fitness, Past/current experiences, Time since onset of injury/illness/exacerbation, and 1 comorbidity: high BMI are also affecting patient's functional outcome.   REHAB POTENTIAL: Good  CLINICAL DECISION MAKING: Evolving/moderate complexity  EVALUATION COMPLEXITY: Moderate   GOALS:  SHORT TERM GOALS: Target date: 03/07/24 Pt will be Ind in an initial HEP  Baseline: started Goal status: INITIAL  2.  Pt will report 25% or greater improvement in bilat knee pain for improved function and QOL Baseline: 4-9/10 Goal status: INITIAL  LONG TERM GOALS: Target date: 04/05/24  Pt will be Ind in a final HEP to maintain achieved LOF  Baseline:  Goal status: INITIAL  2.  Pt will report 75% or greater improvement in bilat knee pain for improved function and QOL Baseline:  Goal status: INITIAL  3.  Pt's bilat knee strength will increase to 5/5 and bilat hip strength to 4+/5 for improved function and tolerance to activiity Baseline:  Goal status: INITIAL  4.  Improve 5xSTS by MCID of 5 as indication of improved functional mobility  Baseline: 20.8  c hands Goal status: INITIAL  5.  Pt's LEFS score will improve by the MCID to 68% as indication of improved function  Baseline: 53% Goal status: INITIAL   PLAN:  PT FREQUENCY: 2x/week  PT DURATION: 6 weeks  PLANNED INTERVENTIONS: 97164- PT Re-evaluation, 97110-Therapeutic exercises, 97530- Therapeutic activity, 97112- Neuromuscular re-education,  97535- Self Care, 02859- Manual therapy, [id number]- Gait training, [id number]- Aquatic Therapy, [id number]- Electrical stimulation (unattended), [phone]- Ionotophoresis 4mg /ml Dexamethasone, 79439 (1-2 muscles), 20561 (3+ muscles)- Dry Needling, Patient/Family education, Stair training, Taping, Joint mobilization, Cryotherapy, and Moist heat  PLAN FOR NEXT SESSION: Assess 5xSTS; assess response to HEP; progress therex as indicated; use of modalities, manual therapy; and TPDN as indicated.   Lorae Roig MS, PT 03/13/24 8:31 PM

## 2024-03-14 ENCOUNTER — Ambulatory Visit

## 2024-03-26 ENCOUNTER — Ambulatory Visit: Admitting: Allergy

## 2024-03-30 ENCOUNTER — Telehealth: Admitting: Family

## 2024-03-30 DIAGNOSIS — J069 Acute upper respiratory infection, unspecified: Secondary | ICD-10-CM | POA: Diagnosis not present

## 2024-03-30 MED ORDER — PROMETHAZINE-DM 6.25-15 MG/5ML PO SYRP: 5.0000 mL | ORAL_SOLUTION | Freq: Three times a day (TID) | ORAL | 0 refills | Status: DC | PRN

## 2024-03-30 MED ORDER — FLUTICASONE PROPIONATE 50 MCG/ACT NA SUSP: 2.0000 | Freq: Every day | NASAL | 6 refills | Status: DC

## 2024-03-30 MED ORDER — BENZONATATE 100 MG PO CAPS: 100.0000 mg | ORAL_CAPSULE | Freq: Three times a day (TID) | ORAL | 0 refills | Status: DC | PRN

## 2024-03-30 NOTE — Patient Instructions (Signed)

## 2024-03-30 NOTE — Progress Notes (Signed)
 Virtual Visit Consent   Natasha Beard, you are scheduled for a virtual visit with a [facility] provider today. Just as with appointments in the office, your consent must be obtained to participate. Your consent will be active for this visit and any virtual visit you may have with one of our providers in the next 365 days. If you have a MyChart account, a copy of this consent can be sent to you electronically.  As this is a virtual visit, video technology does not allow for your provider to perform a traditional examination. This may limit your provider's ability to fully assess your condition. If your provider identifies any concerns that need to be evaluated in person or the need to arrange testing (such as labs, EKG, etc.), we will make arrangements to do so. Although advances in technology are sophisticated, we cannot ensure that it will always work on either your end or our end. If the connection with a video visit is poor, the visit may have to be switched to a telephone visit. With either a video or telephone visit, we are not always able to ensure that we have a secure connection.  By engaging in this virtual visit, you consent to the provision of healthcare and authorize for your insurance to be billed (if applicable) for the services provided during this visit. Depending on your insurance coverage, you may receive a charge related to this service.  I need to obtain your verbal consent now. Are you willing to proceed with your visit today? Natasha Beard has provided verbal consent on 03/30/2024 for a virtual visit (video or telephone). Bari Learn, FNP  Date: 03/30/2024 9:35 AM   Virtual Visit via Video Note   I, Bari Learn, connected with  Natasha Beard  ([id number], 1995-09-05) on 03/30/24 at  9:30 AM EDT by a video-enabled telemedicine application and verified that I am speaking with the correct person using two identifiers.  Location: Patient: Virtual Visit Location Patient:  Home Provider: Virtual Visit Location Provider: Home Office   I discussed the limitations of evaluation and management by telemedicine and the availability of in person appointments. The patient expressed understanding and agreed to proceed.    History of Present Illness: Natasha Beard is a 28 y.o. who identifies as a female who was assigned female at birth, and is being seen today for cough and congestion that started two days ago.  HPI: Cough This is a new problem. The current episode started in the past 7 days. The problem has been unchanged. The problem occurs every few minutes. Associated symptoms include a fever, headaches, nasal congestion, postnasal drip, rhinorrhea and a sore throat. Pertinent negatives include no chills, ear congestion, ear pain, myalgias, shortness of breath or wheezing. She has tried rest and OTC cough suppressant for the symptoms. The treatment provided mild relief.    Problems:  Patient Active Problem List   Diagnosis Date Noted   Mixed anxiety and depressive disorder 07/13/2023   Eczema 07/13/2023   Migraine, unspecified, not intractable, without status migrainosus 07/13/2023   NSVT (nonsustained ventricular tachycardia) (HCC) 06/27/2023   GBS carrier 05/21/2023   Palpitations 11/23/2022    Allergies:  Allergies  Allergen Reactions   Apple Juice     Patient allergic to Apple   Chocolate     In moderation   Erythromycin  Swelling   Medications:  Current Outpatient Medications:    benzonatate  (TESSALON  PERLES) 100 MG capsule, Take 1 capsule (100 mg total) by mouth 3 (three) times daily  as needed., Disp: 20 capsule, Rfl: 0   fluticasone  (FLONASE ) 50 MCG/ACT nasal spray, Place 2 sprays into both nostrils daily., Disp: 16 g, Rfl: 6   promethazine-dextromethorphan (PROMETHAZINE-DM) 6.25-15 MG/5ML syrup, Take 5 mLs by mouth 3 (three) times daily as needed for cough., Disp: 118 mL, Rfl: 0   clobetasol  ointment (TEMOVATE ) 0.05 %, Apply 1 Application topically 2  (two) times daily as needed (rash on body, do not use on face)., Disp: 60 g, Rfl: 5   clotrimazole -betamethasone  (LOTRISONE ) cream, Apply 1 Application topically daily., Disp: 30 g, Rfl: 0   dicloxacillin  (DYNAPEN ) 500 MG capsule, Take 1 capsule (500 mg total) by mouth 4 (four) times daily., Disp: 40 capsule, Rfl: 0   predniSONE  (DELTASONE ) 20 MG tablet, Take 1 tablet (20 mg total) by mouth daily with breakfast., Disp: 5 tablet, Rfl: 0   triamcinolone  cream (KENALOG ) 0.1 %, Apply 1 Application topically 2 (two) times daily., Disp: 30 g, Rfl: 0  Observations/Objective: Patient is well-developed, well-nourished in no acute distress.  Resting comfortably  at home.  Head is normocephalic, atraumatic.  No labored breathing.  Speech is clear and coherent with logical content.  Patient is alert and oriented at baseline.  Nasal congestion  Dry nonproductive cough  Assessment and Plan: 1. Viral URI (Primary) - benzonatate  (TESSALON  PERLES) 100 MG capsule; Take 1 capsule (100 mg total) by mouth 3 (three) times daily as needed.  Dispense: 20 capsule; Refill: 0 - promethazine-dextromethorphan (PROMETHAZINE-DM) 6.25-15 MG/5ML syrup; Take 5 mLs by mouth 3 (three) times daily as needed for cough.  Dispense: 118 mL; Refill: 0 - fluticasone  (FLONASE ) 50 MCG/ACT nasal spray; Place 2 sprays into both nostrils daily.  Dispense: 16 g; Refill: 6  - Take meds as prescribed - Use a cool mist humidifier  -Use saline nose sprays frequently -Force fluids -For any cough or congestion  Use plain Mucinex- regular strength or max strength is fine -For fever or aces or pains- take tylenol  or ibuprofen . -Throat lozenges if help -Follow up if symptoms worsen or do not improve   Follow Up Instructions: I discussed the assessment and treatment plan with the patient. The patient was provided an opportunity to ask questions and all were answered. The patient agreed with the plan and demonstrated an understanding of the  instructions.  A copy of instructions were sent to the patient via MyChart unless otherwise noted below.     The patient was advised to call back or seek an in-person evaluation if the symptoms worsen or if the condition fails to improve as anticipated.    Bari Learn, FNP

## 2024-03-31 ENCOUNTER — Other Ambulatory Visit: Payer: Self-pay | Admitting: Nurse Practitioner

## 2024-03-31 MED ORDER — CLINDAMYCIN PHOS (TWICE-DAILY) 1 % EX GEL: Freq: Two times a day (BID) | CUTANEOUS | 0 refills | Status: DC

## 2024-04-21 ENCOUNTER — Encounter: Payer: Self-pay | Admitting: Radiology

## 2024-04-23 ENCOUNTER — Ambulatory Visit: Payer: Self-pay | Admitting: Nurse Practitioner

## 2024-05-02 ENCOUNTER — Ambulatory Visit (INDEPENDENT_AMBULATORY_CARE_PROVIDER_SITE_OTHER): Admitting: Nurse Practitioner

## 2024-05-02 VITALS — BP 125/74 | HR 85 | Wt 232.6 lb

## 2024-05-02 DIAGNOSIS — R1084 Generalized abdominal pain: Secondary | ICD-10-CM

## 2024-05-02 DIAGNOSIS — F419 Anxiety disorder, unspecified: Secondary | ICD-10-CM | POA: Diagnosis not present

## 2024-05-02 DIAGNOSIS — F418 Other specified anxiety disorders: Secondary | ICD-10-CM

## 2024-05-02 DIAGNOSIS — F332 Major depressive disorder, recurrent severe without psychotic features: Secondary | ICD-10-CM | POA: Diagnosis not present

## 2024-05-02 DIAGNOSIS — K219 Gastro-esophageal reflux disease without esophagitis: Secondary | ICD-10-CM | POA: Diagnosis not present

## 2024-05-02 DIAGNOSIS — F329 Major depressive disorder, single episode, unspecified: Secondary | ICD-10-CM | POA: Insufficient documentation

## 2024-05-02 HISTORY — DX: Generalized abdominal pain: R10.84

## 2024-05-02 NOTE — Assessment & Plan Note (Signed)
    05/02/2024    2:55 PM 10/18/2023   11:05 AM 09/03/2023    1:58 PM 08/06/2023    9:08 AM  GAD 7 : Generalized Anxiety Score  Nervous, Anxious, on Edge 3 0 0 0  Control/stop worrying 3 0 0 0  Worry too much - different things 3 0  0  Trouble relaxing 3 0  0  Restless 3 0  0  Easily annoyed or irritable 3 0  0  Afraid - awful might happen 2 0  0  Total GAD 7 Score 20 0  0  Anxiety Difficulty Somewhat difficult Not difficult at all Not difficult at all    Increased anxiety depression symptoms possibly related to bodily changes and stressors. Prefers counseling with an Brewing Technologist. Previous medication ineffective and poorly tolerated. - Provided a list of therapists in the area, with a preference for African American therapists. - Offered to write a letter for an emotional support animal if needed.

## 2024-05-02 NOTE — Patient Instructions (Signed)
 Behavioral Health Resources:    What if I or someone I know is in crisis?   If you are thinking about harming yourself or having thoughts of suicide, or if you know someone who is, seek help right away.   Call your doctor or mental health care provider.   Call 911 or go to a hospital emergency room to get immediate help, or ask a friend or family member to help you do these things; IF YOU ARE IN [place], YOU MAY GO TO WALK-IN URGENT CARE 24/7 at [facility] (see below)   Call the [place]  National Suicide Prevention Lifeline's toll-free, 24-hour hotline at 1-800-273-TALK (305)381-5495) or TTY: 1-800-799-4 TTY 574-401-3648) to talk to a trained counselor.   If you are in crisis, make sure you are not left alone.    If someone else is in crisis, make sure he or she is not left alone     24 Hour :    [employer]  [address] [place], [place] [address] [phone] or [phone] WALK-IN URGENT CARE 24/7   [facility] Mobile Crisis: [phone]   [place]  National Suicide Hotline: 308-419-3853   [facility] [employer] (Domestic Violence, Rape & Victim Assistance)  [phone]   [facility]  [address], [place]  [address]   [phone] or [phone]    [facility]: [phone] (8am-4pm) or [phone] (after hours)          [facility], [address] [place], [place]  [phone] Fax: [phone] [email or web address] *Interpreters available *Accepts all insurance and uninsured for Urgent Care needs *Accepts Medicaid and uninsured for outpatient treatment    [facility]   Mon-Fri: 8am-5pm [address], [place], [place] [phone](eynwz); [phone](fax) [email or web address]  *Accepts Medicare   [facility] [place], Fri: 8am-4pm [address], [place], [place] [address] [phone] (phone); [phone] (fax) [email or web address]  *Accepts Medicare   [facility] Mon-Fri: 9am-5pm  [address] [place], [place] [phone] (phone); [phone]  [email or web address]  *Accepts Medicaid   [facility] [place], 8:30am-12pm/1pm-2:30pm [address] [place], [place] [phone] (phone); [phone] (fax) [email or web address].[email or web address]  *Accepts Medicaid, sliding-scale*Bilingual services available   [facility] Mon-Fri, 8am-7pm [address] [place], [place]  [phone](eynwz); [phone](fax) [email or web address].[email or web address]  *Accepts Medicaid *Bilingual services available   [facility] Mon-Fri: 8am-5pm, Saturday by appointment only [address] [place], [place], [place] [phone] (phone); [phone] (fax) [email or web address]    [facility] [address], [place], [place] [phone] [email or web address]  *Free & reduced services for uninsured and underinsured individuals *Bilingual services for Spanish-speaking clients 21 and under   [facility], [address] [place], [place] [phone](eynwz); [phone](fax) [email or web address]  *Bring your own interpreter at first visit *Accepts Medicare and [facility] [place]   [facility] Mon-Fri: 9am-5:30pm [address], [place], [place] [phone] (phone), [phone] (fax) After hours crisis line: [phone] [email or web address]  *Accepts Medicare and Medicaid   [employer], 8am-6pm [address] [place], [place]  [phone] (phone); [phone] (fax) [email or web address]  *Subsidized costs available    [facility] Mon-Fri: 8am-4pm [address], [place], [place] [phone](eynwz); [phone](fax) [email or web address]  *Accepts Medicaid   [facility] [place] Same day access hours: Mon-Fri, 8:30-3pm Crisis hours: Mon-Fri, 8am-5pm [address], [place], [place] [phone]   [facility] Same day access hours: Mon-Fri, 8:30-3pm Crisis hours: Mon-Fri, 8am-8pm [address] [place], [place] [phone] (phone); [phone] (fax) [email or web address]  *Accepts Medicaid and Medicare   [facility], [place], Fri: 9am-9pm Tues, Thurs: 9am-6pm [address] [place], [place], [place]  [phone] (phone); [phone] (fax) [email or web address]  *(Accepts Medicare and Medicaid; payment plans available)*Bilingual services available   [facility] [address], [place], [place] [phone] (phone); [phone] (fax) [email or web address]    [facility] [address], [place], [place]  [phone]  [email or web address]  *Bilingual services available   [facility] Mon-Thurs: 8am-8pm [address], [place], [place] [phone] (phone); [phone] (fax) [email or web address]  *Accepts Medicaid*Bilingual services available   [facility] [address] [place], [place] [phone] (phone) [email or web address]  *Accepts Medicaid *Bilingual services available   [facility] Mon-Fri, 9am-4:45pm [address], [place], [place] [phone] (phone); [phone] (fax) [email or web address]  *Accepts Medicare   [facility] Mon-Thurs: 8:30-8pm, Fri: 8:30am-7pm [address] [place], [place] (3rd floor) [phone] (phone); [phone] (fax) [email or web address]  *Accepts Medicaid; income-based reduced rates  available   [facility] Mon-Fri: 8am-5pm [address], [place], [place] [address] [phone] (phone); [phone] (fax) [email or web address]  *Accepts Medicaid*Bilingual services available     [facility] [place])  [address], [place] [phone] www.[email or web address]  *Provides direct services to individuals in recovery from mental illness, including support groups, recovery skills classes, and one on one peer support   [facility] [employer] on Mental Illness) [place] helpline: [phone]  [facility] [place] helpline: [phone] https://[email or web address]  *A community hub for information relating to local resources and services for the friends and families of individuals living alongside a mental health condition, as well as the individuals themselves. Classes and support groups also provided   1. Gastroesophageal reflux disease, unspecified whether esophagitis present (Primary)   2. Generalized abdominal pain  - Ambulatory referral to Gastroenterology - US  Abdomen Complete; Future       It is important that you exercise regularly at least 30 minutes 5 times a week as tolerated  Think about what you will eat, plan ahead. Choose  clean, green, fresh or frozen over canned, processed or packaged foods which are more sugary, salty and fatty. 70 to 75% of food eaten should be vegetables and fruit. Three meals at set times with snacks allowed between meals, but they must be fruit or vegetables. Aim to eat over a 12 hour period , example 7 am to 7 pm, and STOP after  your last meal of the day. Drink water,generally about 64 ounces per day, no other drink is as healthy. Fruit juice is best enjoyed in a healthy way, by EATING the fruit.  Thanks for choosing [facility] we consider it a privelige to serve you.

## 2024-05-02 NOTE — Assessment & Plan Note (Deleted)
    05/02/2024    2:57 PM 10/18/2023   11:04 AM 09/03/2023    1:58 PM  Depression screen PHQ 2/9  Decreased Interest 2 0 0  Down, Depressed, Hopeless 3 0 0  PHQ - 2 Score 5 0 0  Altered sleeping 3    Tired, decreased energy 2    Change in appetite 3    Feeling bad or failure about yourself  3    Trouble concentrating 1    Moving slowly or fidgety/restless 1    Suicidal thoughts 1    PHQ-9 Score 19    Difficult doing work/chores Somewhat difficult     Increased anxiety depression symptoms possibly related to bodily changes and stressors. Prefers counseling with an Brewing Technologist. Previous medication ineffective and poorly tolerated. - Provided a list of therapists in the area, with a preference for African American therapists. - Offered to write a letter for an emotional support animal if needed.

## 2024-05-02 NOTE — Assessment & Plan Note (Signed)
 Gastroesophageal reflux disease (GERD) Intermittent sharp abdominal pain and nausea likely due to GERD, exacerbated by certain foods and drinks. Symptoms persistent over the last weeks. Not on medication due to breastfeeding. - Advised dietary modifications: avoid fatty, fried, spicy foods, and caffeinated drinks. - Recommended not eating late at night, at least 2-3 hours before lying down. - Referred to gastroenterologist for further evaluation. Written material for GERD provided

## 2024-05-02 NOTE — Assessment & Plan Note (Signed)
    05/02/2024    2:57 PM 10/18/2023   11:04 AM 09/03/2023    1:58 PM  PHQ9 SCORE ONLY  PHQ-9 Total Score 19 0  0      Data saved with a previous flowsheet row definition    Increased anxiety depression symptoms possibly related to bodily changes and stressors. Prefers counseling with an Brewing Technologist. Previous medication ineffective and poorly tolerated. - Provided a list of therapists in the area, with a preference for African American therapists. - Offered to write a letter for an emotional support animal if needed.

## 2024-05-02 NOTE — Assessment & Plan Note (Signed)
  Abdominal ultrasound and labs ordered Patient referred to GI

## 2024-05-02 NOTE — Progress Notes (Signed)
 Acute Office Visit  Subjective:     Patient ID: Natasha Beard, female    DOB: March 05, 1996, 28 y.o.   MRN: [id number]  Chief Complaint  Patient presents with   emotional support    Dog or cat    GI Problem    Lateral sides, and straight down the middle, sharp pain    HPI   Discussed the use of AI scribe software for clinical note transcription with the patient, who gave verbal consent to proceed.  History of Present Illness Natasha Beard is a 28 year old female who presents with persistent stomach pain and nausea. She is accompanied by her partner, who is the father of her child.  She has been experiencing sharp stomach pain that has become more persistent over the past couple of weeks. The pain is located in the stomach area and sometimes radiates to the sides. It has been occurring intermittently since after her delivery but has worsened recently.  She experiences occasional nausea and had an episode of vomiting last night, which she attributes to heartburn or acid reflux. She does not regularly experience vomiting. No constipation or blood in stool.  She was previously on medication for acid reflux during her pregnancy but discontinued it while breastfeeding. She is currently not taking any medication for acid reflux due to concerns about safety while breastfeeding. Her diet includes fatty foods, which she believes help with breastfeeding, and she also consumes spicy foods and caffeine, although she tries to limit these.  She is currently breastfeeding her 36-month-old child and has not resumed menstruation since giving birth. She had a brief menstrual period after receiving a Depo shot but has not had one since discontinuing it. She denies been currently  pregnant  She reports experiencing increased depression recently, which she attributes to various personal and bodily changes. She has not been in therapy or counseling but wants to pursue it, particularly with an African American  therapist. She has a history of being on medication for depression, but it was ineffective, and she is not interested in trying medication again.  Assessment & Plan      Review of Systems  Constitutional:  Negative for appetite change, chills, fatigue and fever.  HENT:  Negative for congestion, postnasal drip, rhinorrhea and sneezing.   Respiratory:  Negative for cough, shortness of breath and wheezing.   Cardiovascular:  Negative for chest pain, palpitations and leg swelling.  Gastrointestinal:  Positive for abdominal pain. Negative for constipation, nausea and vomiting.  Genitourinary:  Negative for difficulty urinating, dysuria, flank pain and frequency.  Musculoskeletal:  Negative for arthralgias, back pain, joint swelling and myalgias.  Skin:  Negative for color change, pallor, rash and wound.  Neurological:  Negative for dizziness, facial asymmetry, weakness, numbness and headaches.  Psychiatric/Behavioral:  Negative for behavioral problems, confusion, self-injury and suicidal ideas.         Objective:    BP 125/74 (BP Location: Left Arm, Patient Position: Sitting, Cuff Size: Large)   Pulse 85   Wt 232 lb 9.6 oz (105.5 kg)   SpO2 98%   BMI 36.70 kg/m    Physical Exam Vitals and nursing note reviewed.  Constitutional:      General: She is not in acute distress.    Appearance: Normal appearance. She is obese. She is not ill-appearing, toxic-appearing or diaphoretic.  Eyes:     General: No scleral icterus.       Right eye: No discharge.  Left eye: No discharge.     Extraocular Movements: Extraocular movements intact.     Conjunctiva/sclera: Conjunctivae normal.  Cardiovascular:     Rate and Rhythm: Normal rate and regular rhythm.     Pulses: Normal pulses.     Heart sounds: Normal heart sounds. No murmur heard.    No friction rub. No gallop.  Pulmonary:     Effort: Pulmonary effort is normal. No respiratory distress.     Breath sounds: Normal breath sounds.  No stridor. No wheezing, rhonchi or rales.  Chest:     Chest wall: No tenderness.  Abdominal:     General: There is no distension.     Palpations: Abdomen is soft.     Tenderness: There is abdominal tenderness. There is no right CVA tenderness, left CVA tenderness or guarding.     Comments: Right and lower abdominal tenderness, right upper quadrant tenderness on palpation  Musculoskeletal:        General: No swelling, tenderness, deformity or signs of injury.     Right lower leg: No edema.     Left lower leg: No edema.  Skin:    General: Skin is warm and dry.     Capillary Refill: Capillary refill takes less than 2 seconds.     Coloration: Skin is not jaundiced or pale.     Findings: No bruising, erythema or lesion.  Neurological:     Mental Status: She is alert and oriented to person, place, and time.     Motor: No weakness.     Gait: Gait normal.  Psychiatric:        Mood and Affect: Mood normal.        Behavior: Behavior normal.        Thought Content: Thought content normal.        Judgment: Judgment normal.     No results found for any visits on 05/02/24.      Assessment & Plan:   Problem List Items Addressed This Visit       Digestive   GERD (gastroesophageal reflux disease) - Primary   Gastroesophageal reflux disease (GERD) Intermittent sharp abdominal pain and nausea likely due to GERD, exacerbated by certain foods and drinks. Symptoms persistent over the last weeks. Not on medication due to breastfeeding. - Advised dietary modifications: avoid fatty, fried, spicy foods, and caffeinated drinks. - Recommended not eating late at night, at least 2-3 hours before lying down. - Referred to gastroenterologist for further evaluation. Written material for GERD provided         Other   Mixed anxiety and depressive disorder   Anxiety      05/02/2024    2:55 PM 10/18/2023   11:05 AM 09/03/2023    1:58 PM 08/06/2023    9:08 AM  GAD 7 : Generalized Anxiety Score   Nervous, Anxious, on Edge 3 0 0 0  Control/stop worrying 3 0 0 0  Worry too much - different things 3 0  0  Trouble relaxing 3 0  0  Restless 3 0  0  Easily annoyed or irritable 3 0  0  Afraid - awful might happen 2 0  0  Total GAD 7 Score 20 0  0  Anxiety Difficulty Somewhat difficult Not difficult at all Not difficult at all    Increased anxiety depression symptoms possibly related to bodily changes and stressors. Prefers counseling with an Brewing Technologist. Previous medication ineffective and poorly tolerated. - Provided a list of therapists  in the area, with a preference for African American therapists. - Offered to write a letter for an emotional support animal if needed.      Major depression      05/02/2024    2:57 PM 10/18/2023   11:04 AM 09/03/2023    1:58 PM  PHQ9 SCORE ONLY  PHQ-9 Total Score 19 0  0      Data saved with a previous flowsheet row definition    Increased anxiety depression symptoms possibly related to bodily changes and stressors. Prefers counseling with an Brewing Technologist. Previous medication ineffective and poorly tolerated. - Provided a list of therapists in the area, with a preference for African American therapists. - Offered to write a letter for an emotional support animal if needed.      Generalized abdominal pain    Abdominal ultrasound and labs ordered Patient referred to GI      Relevant Orders   US  Abdomen Complete   Ambulatory referral to Gastroenterology   CMP14+EGFR   CBC    No orders of the defined types were placed in this encounter.   No follow-ups on file.  Dietra Stokely R Carrin Vannostrand, FNP

## 2024-05-06 ENCOUNTER — Telehealth: Payer: Self-pay

## 2024-05-06 NOTE — Telephone Encounter (Signed)
 Copied from CRM #[id number]. Topic: General - Other >> May 05, 2024  2:00 PM Joesph B wrote: Reason for CRM: Shawnee from [facility] is wanting more information on the order they received. What else is the provider needing to assess other than abdominal pain?   PH: [phone]. Opt 1 then 5.

## 2024-05-06 NOTE — Telephone Encounter (Signed)
 Done [facility]

## 2024-05-07 ENCOUNTER — Other Ambulatory Visit: Payer: Self-pay | Admitting: Nurse Practitioner

## 2024-05-08 ENCOUNTER — Telehealth: Payer: Self-pay

## 2024-05-08 NOTE — Telephone Encounter (Signed)
 Please advise [facility]

## 2024-05-08 NOTE — Telephone Encounter (Signed)
 Copied from CRM #[id number]. Topic: General - Other >> May 08, 2024  2:31 PM Charlet HERO wrote: Reason for CRM: Shawnee [facility] [phone] opt 1 the 5, is calling to get asst with what the provider is wanting to access for Suzen called back and the order only accesses the upper quadron and they actually want to know what is the correct order is for. She has not been scheduled as of yet would like a call back to get resolved asap.

## 2024-05-13 NOTE — Telephone Encounter (Signed)
 Please advise if you have called the read room to find out what needs to be ordered for this pt to have her U/S. Number is [phone]. Thank you.   Suzen Shove   CMA II

## 2024-05-13 NOTE — Telephone Encounter (Signed)
 Sent message to provider for U/S order. Kh

## 2024-05-14 NOTE — Addendum Note (Signed)
 Addended by: JUANICE THOMES SAUNDERS on: 05/14/2024 08:06 AM   Modules accepted: Orders

## 2024-05-22 ENCOUNTER — Telehealth: Payer: Self-pay

## 2024-05-22 NOTE — Telephone Encounter (Signed)
 Copied from CRM #[id number]. Topic: Appointments - Scheduling Inquiry for Clinic >> May 22, 2024 10:34 AM Emylou G wrote: Reason for CRM: Patient sent msg.. found out she is pregnant - can she be seen here or does she go to the wom[facility] clinic?  The pt was advised that she should follow up with the womens clinic. KH

## 2024-05-26 ENCOUNTER — Encounter: Payer: Self-pay | Admitting: Certified Nurse Midwife

## 2024-05-26 ENCOUNTER — Ambulatory Visit (INDEPENDENT_AMBULATORY_CARE_PROVIDER_SITE_OTHER): Admitting: *Deleted

## 2024-05-26 DIAGNOSIS — Z3201 Encounter for pregnancy test, result positive: Secondary | ICD-10-CM

## 2024-05-26 DIAGNOSIS — Z3200 Encounter for pregnancy test, result unknown: Secondary | ICD-10-CM

## 2024-05-26 LAB — POCT PREGNANCY, URINE: Preg Test, Ur: POSITIVE — AB

## 2024-05-26 NOTE — Progress Notes (Signed)
 Possible Pregnancy  Patient dropped off urine for a walk in UPT which was positive.  Patient called; no answer.  Message left for patient to return our call.    Rosina RN

## 2024-05-27 ENCOUNTER — Telehealth: Payer: Self-pay

## 2024-05-27 DIAGNOSIS — Z3490 Encounter for supervision of normal pregnancy, unspecified, unspecified trimester: Secondary | ICD-10-CM

## 2024-05-27 DIAGNOSIS — O3680X0 Pregnancy with inconclusive fetal viability, not applicable or unspecified: Secondary | ICD-10-CM

## 2024-05-27 NOTE — Telephone Encounter (Signed)
 Advised pt of positive UPT.  Reviewed allergies and pharmacy.  Pt reports she is taking prenatal gummies and hopes to receive her [facility] here at [facility].  Pt states she would like to receive prenatal care here at [facility] again.  She stopped her Depo injections in June, has not had any spotting since sometime in August.  She took pregnancy test at home due to not having period and decrease in breast milk supply.  Dating/Viability ultrasound scheduled for 06/03/24 at 3:15pm.  Advised pt I will send message to admin to schedule Intake and New OB.    Waddell, RN

## 2024-05-27 NOTE — Addendum Note (Signed)
 Addended by: ELBY WADDELL CROME on: 05/27/2024 05:17 PM   Modules accepted: Orders

## 2024-05-27 NOTE — Telephone Encounter (Signed)
 Pt left voicemail stating she was returning call from [place] about her results.    Waddell, RN

## 2024-05-31 ENCOUNTER — Inpatient Hospital Stay (HOSPITAL_COMMUNITY)

## 2024-05-31 ENCOUNTER — Inpatient Hospital Stay (HOSPITAL_COMMUNITY)
Admission: AD | Admit: 2024-05-31 | Discharge: 2024-05-31 | Disposition: A | Discharge status: Home / Self Care | Attending: Obstetrics & Gynecology | Admitting: Obstetrics & Gynecology

## 2024-05-31 DIAGNOSIS — O209 Hemorrhage in early pregnancy, unspecified: Secondary | ICD-10-CM

## 2024-05-31 DIAGNOSIS — B9689 Other specified bacterial agents as the cause of diseases classified elsewhere: Secondary | ICD-10-CM

## 2024-05-31 DIAGNOSIS — Z3A01 Less than 8 weeks gestation of pregnancy: Secondary | ICD-10-CM

## 2024-05-31 DIAGNOSIS — O98819 Other maternal infectious and parasitic diseases complicating pregnancy, unspecified trimester: Secondary | ICD-10-CM

## 2024-05-31 LAB — URINALYSIS, ROUTINE W REFLEX MICROSCOPIC
Bacteria, UA: NONE SEEN
Bilirubin Urine: NEGATIVE
Glucose, UA: NEGATIVE mg/dL
Ketones, ur: NEGATIVE mg/dL
Nitrite: NEGATIVE
Protein, ur: NEGATIVE mg/dL
Specific Gravity, Urine: 1.018 (ref 1.005–1.030)
pH: 6 (ref 5.0–8.0)

## 2024-05-31 LAB — CBC
HCT: 39.5 % (ref 36.0–46.0)
Hemoglobin: 13.9 g/dL (ref 12.0–15.0)
MCH: 31.7 pg (ref 26.0–34.0)
MCHC: 35.2 g/dL (ref 30.0–36.0)
MCV: 90.2 fL (ref 80.0–100.0)
Platelets: 184 K/uL (ref 150–400)
RBC: 4.38 MIL/uL (ref 3.87–5.11)
RDW: 13.3 % (ref 11.5–15.5)
WBC: 10 K/uL (ref 4.0–10.5)
nRBC: 0 % (ref 0.0–0.2)

## 2024-05-31 LAB — HCG, QUANTITATIVE, PREGNANCY: hCG, Beta Chain, Quant, S: 100576 m[IU]/mL — ABNORMAL HIGH (ref <5)

## 2024-05-31 LAB — WET PREP, GENITAL
Sperm: NONE SEEN
Trich, Wet Prep: NONE SEEN
WBC, Wet Prep HPF POC: <10 (ref <10)

## 2024-05-31 MED ORDER — FLUCONAZOLE 150 MG PO TABS: 150.0000 mg | ORAL_TABLET | Freq: Once | ORAL | 0 refills | Status: AC

## 2024-05-31 MED ORDER — METRONIDAZOLE 0.75 % VA GEL: 1.0000 | Freq: Every day | VAGINAL | 1 refills | Status: DC

## 2024-05-31 NOTE — MAU Provider Note (Signed)
 History     CSN: [id number]  Arrival date and time: 05/31/24 8063   Event Date/Time   First Provider Initiated Contact with Patient 05/31/24 2136      Chief Complaint  Patient presents with   Abdominal Pain   Vaginal Bleeding    Natasha Beard is a 28 y.o. G2P1001 at [redacted]w[redacted]d by US  performed today who receives care at [facility]. She reports receiving depo, but has not had a dose since June.   She presents today for vaginal bleeding.  She states she aws at the store and noted blood.  She reports noting blood since arrival when urinating and completing swabs.  She reports once incident of cramping earlier, but none since. No issues with urination, constipation, or diarrhea. She reports some vaginal discharge prior to spotting, but no odor or abnormal color.  She denies recent sexual activity.   OB History     Gravida  2   Para  1   Term  1   Preterm  0   AB  0   Living  1      SAB  0   IAB  0   Ectopic  0   Multiple  0   Live Births  1           Past Medical History:  Diagnosis Date   Allergy     Apples, milk chocolate   Anxiety    Arthritis    Depression    Eczema     Past Surgical History:  Procedure Laterality Date   NO PAST SURGERIES      Family History  Problem Relation Age of Onset   Anemia Mother    Diabetes Father    Seizures Father    Breast cancer Maternal Aunt    Breast cancer Maternal Grandmother     Social History[1]  Allergies: Allergies[2]  Medications Prior to Admission  Medication Sig Dispense Refill Last Dose/Taking   benzonatate  (TESSALON  PERLES) 100 MG capsule Take 1 capsule (100 mg total) by mouth 3 (three) times daily as needed. (Patient not taking: Reported on 05/02/2024) 20 capsule 0    clindamycin  (CLINDAGEL) 1 % gel APPLY TOPICALLY TWICE A DAY 30 g 0    clobetasol  ointment (TEMOVATE ) 0.05 % Apply 1 Application topically 2 (two) times daily as needed (rash on body, do not use on face). (Patient not taking: Reported on  05/02/2024) 60 g 5    clotrimazole -betamethasone  (LOTRISONE ) cream Apply 1 Application topically daily. (Patient not taking: Reported on 05/02/2024) 30 g 0    dicloxacillin  (DYNAPEN ) 500 MG capsule Take 1 capsule (500 mg total) by mouth 4 (four) times daily. (Patient not taking: Reported on 05/02/2024) 40 capsule 0    fluticasone  (FLONASE ) 50 MCG/ACT nasal spray Place 2 sprays into both nostrils daily. (Patient not taking: Reported on 05/02/2024) 16 g 6    predniSONE  (DELTASONE ) 20 MG tablet Take 1 tablet (20 mg total) by mouth daily with breakfast. (Patient not taking: Reported on 05/02/2024) 5 tablet 0    Prenatal MV & Min w/FA-DHA (PRENATAL GUMMIES PO) Take 2 tablets by mouth daily.      promethazine -dextromethorphan (PROMETHAZINE -DM) 6.25-15 MG/5ML syrup Take 5 mLs by mouth 3 (three) times daily as needed for cough. 118 mL 0    triamcinolone  cream (KENALOG ) 0.1 % Apply 1 Application topically 2 (two) times daily. 30 g 0     Review of Systems  Gastrointestinal:  Positive for abdominal pain. Negative for nausea and vomiting.  Genitourinary:  Positive for vaginal bleeding. Negative for difficulty urinating, dysuria and vaginal discharge.   Physical Exam   Blood pressure 135/73, pulse 91, temperature 98.3 F (36.8 C), temperature source Oral, resp. rate 18, height 5' 7 (1.702 m), weight 110.4 kg, last menstrual period 04/21/2024, SpO2 100%, currently breastfeeding.  Physical Exam Vitals and nursing note reviewed.  Constitutional:      General: She is not in acute distress.    Appearance: Normal appearance. She is well-developed. She is not ill-appearing or toxic-appearing.  HENT:     Head: Normocephalic and atraumatic.  Eyes:     Conjunctiva/sclera: Conjunctivae normal.  Cardiovascular:     Rate and Rhythm: Normal rate.  Pulmonary:     Effort: Pulmonary effort is normal. No respiratory distress.  Musculoskeletal:        General: Normal range of motion.     Cervical back: Normal  range of motion.  Neurological:     Mental Status: She is alert and oriented to person, place, and time.  Psychiatric:        Mood and Affect: Mood normal.        Behavior: Behavior normal.     MAU Course  Procedures Results for orders placed or performed during the hospital encounter of 05/31/24 (from the past 24 hours)  Urinalysis, Routine w reflex microscopic -Urine, Clean Catch     Status: Abnormal   Collection Time: 05/31/24  7:50 PM  Result Value Ref Range   Color, Urine YELLOW YELLOW   APPearance HAZY (A) CLEAR   Specific Gravity, Urine 1.018 1.005 - 1.030   pH 6.0 5.0 - 8.0   Glucose, UA NEGATIVE NEGATIVE mg/dL   Hgb urine dipstick LARGE (A) NEGATIVE   Bilirubin Urine NEGATIVE NEGATIVE   Ketones, ur NEGATIVE NEGATIVE mg/dL   Protein, ur NEGATIVE NEGATIVE mg/dL   Nitrite NEGATIVE NEGATIVE   Leukocytes,Ua SMALL (A) NEGATIVE   RBC / HPF 0-5 0 - 5 RBC/hpf   WBC, UA 0-5 0 - 5 WBC/hpf   Bacteria, UA NONE SEEN NONE SEEN   Squamous Epithelial / HPF 6-10 0 - 5 /HPF   Mucus PRESENT   Wet prep, genital     Status: Abnormal   Collection Time: 05/31/24  7:50 PM  Result Value Ref Range   Yeast Wet Prep HPF POC PRESENT (A) NONE SEEN   Trich, Wet Prep NONE SEEN NONE SEEN   Clue Cells Wet Prep HPF POC PRESENT (A) NONE SEEN   WBC, Wet Prep HPF POC <10 <10   Sperm NONE SEEN   CBC     Status: None   Collection Time: 05/31/24  8:27 PM  Result Value Ref Range   WBC 10.0 4.0 - 10.5 K/uL   RBC 4.38 3.87 - 5.11 MIL/uL   Hemoglobin 13.9 12.0 - 15.0 g/dL   HCT 60.4 63.9 - 53.9 %   MCV 90.2 80.0 - 100.0 fL   MCH 31.7 26.0 - 34.0 pg   MCHC 35.2 30.0 - 36.0 g/dL   RDW 86.6 88.4 - 84.4 %   Platelets 184 150 - 400 K/uL   nRBC 0.0 0.0 - 0.2 %   US  OB LESS THAN 14 WEEKS WITH OB TRANSVAGINAL Result Date: 05/31/2024 EXAM: ULTRASOUND FIRST TRIMESTER TECHNIQUE: Transabdominal and Transvaginal first trimester obstetric pelvic duplex ultrasound was performed with real-time imaging and color  flow Doppler imaging. COMPARISON: None available. CLINICAL HISTORY: Vaginal bleeding in pregnancy, first trimester. FINDINGS: UTERUS: No focal myometrial mass. GESTATIONAL SAC(S): Single normal appearing  gestational sac. No subchorionic hemorrhage. YOLK SAC: Yolk sac is visualized. EMBRYO(<11WK) /FETUS(>=11WK): Embryo present. CROWN RUMP LENGTH: Crown rump length is 11 mm corresponding to a gestational age of [redacted] weeks 1 day. RATE OF CARDIAC ACTIVITY: Cardiac rate 138 beats per minute. RIGHT OVARY: The ovaries are well visualized and within normal limits. Normal arterial and venous flow. LEFT OVARY: The ovaries are well visualized and within normal limits. Normal arterial and venous flow. FREE FLUID: No free fluid is seen. MEASUREMENTS ESTIMATED GESTATIONAL AGE BY CURRENT ULTRASOUND: 7 weeks 1 day ESTIMATED GESTATIONAL AGE BY LMP/PRIOR ULTRASOUND:  unknown ESTIMATED DUE DATE: 01/16/2025 IMPRESSION: 1. Single viable intrauterine pregnancy with cardiac activity at 138 bpm and crown rump length of 11 mm, consistent with 7 weeks 1 day gestational age. Estimated date of confinement is 01/16/2025. 2. No subchorionic hemorrhage. Electronically signed by: Natasha Devonshire MD 05/31/2024 09:24 PM EST RP Workstation: [id number]    MDM Physical Exam Cultures: Wet Prep and GC/CT Labs: UA, UPT, CBC, CMP, hCG, ABO Ultrasound Assessment and Plan  28 year old, G2P1001  SIUP at 7.1 weeks Vaginal Bleeding  -Labs and US  ordered by previous provider.  -Results reviewed and patient informed of EDD. -Patient expresses surprise. -Reviewed treatment for yeast and bv.  Discussed oral treatment for yeast and vaginal for BV as she is actively breastfeeding.  -Patient without questions. -Rx sent to pharmacy on file.  -Precautions reviewed. -Congratulations given. -Encouraged to start prenatal care.  -Discharged to home in stable condition.   Harlene LITTIE Duncans MSN, CNM Advanced Practice Provider, [facility] 05/31/2024, 9:36 PM      [1]  Social History Tobacco Use   Smoking status: Never    Passive exposure: Past   Smokeless tobacco: Never  Vaping Use   Vaping status: Never Used  Substance Use Topics   Alcohol use: Not Currently   Drug use: Not Currently    Types: Marijuana    Comment: +UDS 12/11/22 THC  [2]  Allergies Allergen Reactions   Apple Juice     Patient allergic to Apple   Chocolate     In moderation   Erythromycin  Swelling

## 2024-05-31 NOTE — MAU Note (Signed)
 MAU Triage Note: Natasha Beard is a 28 y.o. at [redacted]w[redacted]d here in MAU reporting: started having abdominal cramps this morning. She was in [employer] in the past hour when she noticed a light amount of bleeding. Denies pad use or recent IC.  Patient complaint: bleeding and cramping  Pain Score: 2  Pain Location: Abdomen     Onset of complaint: this AM LMP: Patient's last menstrual period was 04/21/2024 (exact date).  Vitals:   05/31/24 1947  BP: 135/73  Pulse: 91  Resp: 18  Temp: 98.3 F (36.8 C)  SpO2: 100%    FHT:   N/A Lab orders placed from triage: UA, wet prep, G/C

## 2024-05-31 NOTE — MAU Note (Signed)
 RN called phlebotomist to inquire about status of lab draw - Shaquan informed RN that she would draw patient.

## 2024-06-02 LAB — GC/CHLAMYDIA PROBE AMP (~~LOC~~) NOT AT ARMC
Chlamydia: NEGATIVE
Comment: NEGATIVE
Comment: NORMAL
Neisseria Gonorrhea: NEGATIVE

## 2024-06-03 ENCOUNTER — Telehealth: Payer: Self-pay

## 2024-06-03 ENCOUNTER — Other Ambulatory Visit

## 2024-06-03 NOTE — Telephone Encounter (Signed)
 Called pt to inform her that the US  she had at MAU on 05/31/24 confirmed dating and viability of her IUP.  The ultrasound scheduled today is no longer needed.  Pt verbalized understanding of cancelling todays visit.  Reminded pt that her virtual New OB Intake visit is scheduled for 07/02/24 at 1:15pm.    Waddell, RN

## 2024-06-06 ENCOUNTER — Other Ambulatory Visit: Payer: Self-pay

## 2024-06-06 ENCOUNTER — Inpatient Hospital Stay (HOSPITAL_COMMUNITY)
Admission: AD | Admit: 2024-06-06 | Discharge: 2024-06-06 | Disposition: A | Discharge status: Home / Self Care | Attending: Obstetrics and Gynecology | Admitting: Obstetrics and Gynecology

## 2024-06-06 ENCOUNTER — Encounter: Payer: Self-pay | Admitting: Certified Nurse Midwife

## 2024-06-06 DIAGNOSIS — O26891 Other specified pregnancy related conditions, first trimester: Secondary | ICD-10-CM | POA: Diagnosis not present

## 2024-06-06 DIAGNOSIS — N939 Abnormal uterine and vaginal bleeding, unspecified: Secondary | ICD-10-CM

## 2024-06-06 DIAGNOSIS — Z3A08 8 weeks gestation of pregnancy: Secondary | ICD-10-CM | POA: Diagnosis not present

## 2024-06-06 DIAGNOSIS — O4691 Antepartum hemorrhage, unspecified, first trimester: Secondary | ICD-10-CM | POA: Diagnosis present

## 2024-06-06 MED ORDER — FLUCONAZOLE 150 MG PO TABS: 150.0000 mg | ORAL_TABLET | Freq: Every day | ORAL | 0 refills | Status: DC

## 2024-06-06 NOTE — MAU Note (Signed)
 Natasha Beard is a 28 y.o. at [redacted]w[redacted]d here in MAU reporting: she's having scant/light dark red VB with small clots that began today.  States also having light cramping that isn't painful.  LMP: 04/21/2024 Onset of complaint: today Pain score: 0 Vitals:   06/06/24 1113  BP: 134/65  Pulse: 81  Resp: 20  Temp: 98.6 F (37 C)  SpO2: 98%     FHT: NA  Lab orders placed from triage: None

## 2024-06-06 NOTE — MAU Provider Note (Signed)
 " History     [id number]  Arrival date and time: 06/06/24 1059    Chief Complaint  Patient presents with   Vaginal Bleeding     HPI Natasha Beard is a 28 y.o. at [redacted]w[redacted]d by LMP, who presents for vaginal discharge. Patient states that she was treated for yeast and BV in the MAU on 12/13. She was given diflucan  and vaginal metronidazole  gel due to breast feeding. She stated that she just completed the vaginal metronidazole  yesterday. This past week she has had vaginal discharge throughout the week but it had changed to brown in color today and that made her concerned. It only occurred the one time this morning and has not happened since. Denies abdominal pain cramping, dysuria.      Past Medical History:  Diagnosis Date   Allergy     Apples, milk chocolate   Anxiety    Arthritis    Depression    Eczema     Past Surgical History:  Procedure Laterality Date   NO PAST SURGERIES      Family History  Problem Relation Age of Onset   Anemia Mother    Diabetes Father    Seizures Father    Breast cancer Maternal Aunt    Breast cancer Maternal Grandmother     Social History   Socioeconomic History   Marital status: Single    Spouse name: Not on file   Number of children: 1   Years of education: Not on file   Highest education level: Associate degree: occupational, scientist, product/process development, or vocational program  Occupational History   Not on file  Tobacco Use   Smoking status: Never    Passive exposure: Past   Smokeless tobacco: Never  Vaping Use   Vaping status: Never Used  Substance and Sexual Activity   Alcohol use: Not Currently   Drug use: Not Currently    Types: Marijuana    Comment: +UDS 12/11/22 THC   Sexual activity: Yes    Birth control/protection: None  Other Topics Concern   Not on file  Social History Narrative   Lives with her boyfriend    Social Drivers of Health   Tobacco Use: Low Risk (05/02/2024)   Patient History    Smoking Tobacco Use: Never    Smokeless  Tobacco Use: Never    Passive Exposure: Past  Financial Resource Strain: Medium Risk (05/02/2024)   Overall Financial Resource Strain (CARDIA)    Difficulty of Paying Living Expenses: Somewhat hard  Food Insecurity: Food Insecurity Present (05/02/2024)   Epic    Worried About Programme Researcher, Broadcasting/film/video in the Last Year: Sometimes true    Ran Out of Food in the Last Year: Never true  Transportation Needs: Unmet Transportation Needs (05/02/2024)   Epic    Lack of Transportation (Medical): Yes    Lack of Transportation (Non-Medical): Yes  Physical Activity: Insufficiently Active (05/02/2024)   Exercise Vital Sign    Days of Exercise per Week: 2 days    Minutes of Exercise per Session: 30 min  Stress: Stress Concern Present (05/02/2024)   [employer] of Occupational Health - Occupational Stress Questionnaire    Feeling of Stress: Very much  Social Connections: Socially Isolated (05/02/2024)   Social Connection and Isolation Panel    Frequency of Communication with Friends and Family: Once a week    Frequency of Social Gatherings with Friends and Family: Never    Attends Religious Services: Never    Database Administrator or Organizations:  No    Attends Banker Meetings: Not on file    Marital Status: Living with partner  Intimate Partner Violence: Not At Risk (06/07/2023)   Humiliation, Afraid, Rape, and Kick questionnaire    Fear of Current or Ex-Partner: No    Emotionally Abused: No    Physically Abused: No    Sexually Abused: No  Depression (PHQ2-9): High Risk (05/02/2024)   Depression (PHQ2-9)    PHQ-2 Score: 19  Alcohol Screen: Low Risk (05/02/2024)   Alcohol Screen    Last Alcohol Screening Score (AUDIT): 1  Housing: High Risk (05/02/2024)   Epic    Unable to Pay for Housing in the Last Year: Yes    Number of Times Moved in the Last Year: 0    Homeless in the Last Year: Patient declined  Utilities: Not At Risk (06/07/2023)   [facility] Utilities    Threatened  with loss of utilities: No  Health Literacy: Not on file    Allergies[1]  Medications Ordered Prior to Encounter[2]  Pertinent positives and negative per HPI, all others reviewed and negative  Physical Exam   BP 134/65 (BP Location: Right Arm)   Pulse 81   Temp 98.6 F (37 C) (Oral)   Resp 20   Ht 5' 7 (1.702 m)   Wt 104.7 kg   LMP 04/21/2024 (Exact Date)   SpO2 98%   BMI 36.16 kg/m   Patient Vitals for the past 24 hrs:  BP Temp Temp src Pulse Resp SpO2 Height Weight  06/06/24 1113 134/65 98.6 F (37 C) Oral 81 20 98 % -- --  06/06/24 1108 -- -- -- -- -- -- 5' 7 (1.702 m) 104.7 kg    Physical Exam Vitals and nursing note reviewed.  Constitutional:      Appearance: She is well-developed.  HENT:     Head: Normocephalic and atraumatic.     Mouth/Throat:     Mouth: Mucous membranes are moist.  Eyes:     Extraocular Movements: Extraocular movements intact.  Cardiovascular:     Rate and Rhythm: Normal rate and regular rhythm.  Pulmonary:     Effort: Pulmonary effort is normal.  Abdominal:     Palpations: Abdomen is soft.     Tenderness: There is no abdominal tenderness.  Skin:    Capillary Refill: Capillary refill takes less than 2 seconds.  Neurological:     General: No focal deficit present.     Mental Status: She is alert.      Cervical Exam    Bedside Ultrasound Pt informed that the ultrasound is considered a limited OB ultrasound and is not intended to be a complete ultrasound exam.  Patient also informed that the ultrasound is not being completed with the intent of assessing for fetal or placental anomalies or any pelvic abnormalities.  Explained that the purpose of todays ultrasound is to assess for  viability.  Patient acknowledges the purpose of the exam and the limitations of the study.      My interpretation: First trimester findings: Fetal cardiac activity: present, FHR 143 Crown-rump length: consistent with 8 weeks and 3 days  Cervix appears  closed but limited view due to transabdominal scan.  Labs No results found for this or any previous visit (from the past 24 hours).  Imaging No results found.  MAU Course  Procedures Lab Orders  No laboratory test(s) ordered today   Meds ordered this encounter  Medications   fluconazole  (DIFLUCAN ) 150 MG tablet  Sig: Take 1 tablet (150 mg total) by mouth daily.    Dispense:  1 tablet    Refill:  0   Imaging Orders  No imaging studies ordered today    MDM Moderate (Level 3-4)  Assessment and Plan  Vaginal bleeding  [redacted] weeks gestation of pregnancy   Natasha Beard is a 28 y.o. at [redacted]w[redacted]d by LMP, who presents for vaginal discharge.  -Bedside US  reveals IUP with CRL consistent with [redacted]w[redacted]d fetus with fetal heart rate of 143. -One episode of brown discharge that has since subsided.  -After US  patient stated that she had no more concerns and just wanted to make sure that the baby was ok. She stated it was her son's birthday and wanted to get home.   -Stable for discharge -Due to the vaginal discharge, another single dose of diflucan  sent to patient pharmacy as first dose was taken before the metronidazole  was started.  -All questions answered, anticipatory guidance and detailed miscarriage precautions provided.       Natasha Binning L Sweet Jarvis, MD/MHA 06/06/2024 11:41 AM  Allergies as of 06/06/2024       Reactions   Apple Juice    Patient allergic to Apple   Chocolate    In moderation   Erythromycin  Swelling        Medication List     STOP taking these medications    metroNIDAZOLE  0.75 % vaginal gel Commonly known as: METROGEL        TAKE these medications    fluconazole  150 MG tablet Commonly known as: Diflucan  Take 1 tablet (150 mg total) by mouth daily.   PRENATAL GUMMIES PO Take 2 tablets by mouth daily.           [1]  Allergies Allergen Reactions   Apple Juice     Patient allergic to Apple   Chocolate     In moderation   Erythromycin   Swelling  [2]  No current facility-administered medications on file prior to encounter.   Current Outpatient Medications on File Prior to Encounter  Medication Sig Dispense Refill   Prenatal MV & Min w/FA-DHA (PRENATAL GUMMIES PO) Take 2 tablets by mouth daily.     metroNIDAZOLE  (METROGEL ) 0.75 % vaginal gel Place 1 Applicatorful vaginally at bedtime. Apply one applicatorful to vagina at bedtime for 5 days 70 g 1   "

## 2024-06-13 ENCOUNTER — Encounter: Payer: Self-pay | Admitting: Certified Nurse Midwife

## 2024-06-17 ENCOUNTER — Other Ambulatory Visit: Payer: Self-pay

## 2024-06-17 ENCOUNTER — Emergency Department (HOSPITAL_BASED_OUTPATIENT_CLINIC_OR_DEPARTMENT_OTHER)
Admission: EM | Admit: 2024-06-17 | Discharge: 2024-06-17 | Disposition: A | Discharge status: Home / Self Care | Attending: Emergency Medicine | Admitting: Emergency Medicine

## 2024-06-17 ENCOUNTER — Emergency Department (HOSPITAL_BASED_OUTPATIENT_CLINIC_OR_DEPARTMENT_OTHER): Admitting: Radiology

## 2024-06-17 DIAGNOSIS — S60141A Contusion of right ring finger with damage to nail, initial encounter: Secondary | ICD-10-CM | POA: Diagnosis not present

## 2024-06-17 DIAGNOSIS — O9A211 Injury, poisoning and certain other consequences of external causes complicating pregnancy, first trimester: Secondary | ICD-10-CM | POA: Insufficient documentation

## 2024-06-17 DIAGNOSIS — Z3A10 10 weeks gestation of pregnancy: Secondary | ICD-10-CM | POA: Insufficient documentation

## 2024-06-17 DIAGNOSIS — W230XXA Caught, crushed, jammed, or pinched between moving objects, initial encounter: Secondary | ICD-10-CM | POA: Diagnosis not present

## 2024-06-17 DIAGNOSIS — S6010XA Contusion of unspecified finger with damage to nail, initial encounter: Secondary | ICD-10-CM

## 2024-06-17 DIAGNOSIS — O0991 Supervision of high risk pregnancy, unspecified, first trimester: Secondary | ICD-10-CM | POA: Diagnosis not present

## 2024-06-17 DIAGNOSIS — S6710XA Crushing injury of unspecified finger(s), initial encounter: Secondary | ICD-10-CM

## 2024-06-17 NOTE — ED Provider Notes (Signed)
 "  EMERGENCY DEPARTMENT AT [facility] Provider Note   CSN: [id number] Arrival date & time: 06/17/24  9165     Patient presents with: Finger Injury   Natasha Beard is a 28 y.o. female.   Patient presents to the emergency department today for evaluation of right ring finger pain.  She got her finger smashed in a door prior to arrival today.  Patient is in first trimester pregnancy.  No treatments prior to arrival.  Pain is worse with movement.  She has noted bleeding beneath the nail.       Prior to Admission medications  Medication Sig Start Date End Date Taking? Authorizing Provider  fluconazole  (DIFLUCAN ) 150 MG tablet Take 1 tablet (150 mg total) by mouth daily. 06/06/24   Cashion, Colter L, MD  Prenatal MV & Min w/FA-DHA (PRENATAL GUMMIES PO) Take 2 tablets by mouth daily.    [provider]    Allergies: Apple juice, Chocolate, and Erythromycin     Review of Systems  Updated Vital Signs BP (!) 154/112 (BP Location: Right Arm)   Pulse 89   Temp 98.1 F (36.7 C) (Oral)   Resp 18   LMP 04/21/2024 (Exact Date)   SpO2 99%   Physical Exam Vitals and nursing note reviewed.  Constitutional:      Appearance: She is well-developed.  HENT:     Head: Normocephalic and atraumatic.  Eyes:     Conjunctiva/sclera: Conjunctivae normal.  Pulmonary:     Effort: No respiratory distress.  Musculoskeletal:     Cervical back: Normal range of motion and neck supple.     Comments: Right ring finger: Patient is able to flex and extend the digit, has some difficulty with flexion at the DIP due to pain.  Patient has a subungual hematoma noted, approximately 90% of the nailbed.  Skin:    General: Skin is warm and dry.  Neurological:     Mental Status: She is alert.     (all labs ordered are listed, but only abnormal results are displayed) Labs Reviewed - No data to display  EKG: None  Radiology: DG Hand Complete Right Result Date: 06/17/2024 EXAM: 3  OR MORE VIEW(S) XRAY OF THE RIGHT HAND 06/17/2024 09:01:26 AM COMPARISON: None available. CLINICAL HISTORY: Finger injury Finger injury FINDINGS: BONES AND JOINTS: No acute fracture. No malalignment. SOFT TISSUES: The soft tissues are unremarkable. IMPRESSION: 1. No significant abnormality. Electronically signed by: Evalene Coho MD 06/17/2024 09:59 AM EST RP Workstation: [id number]     Procedures   Medications Ordered in the ED - No data to display  ED Course  Patient seen and examined. History obtained directly from patient. Work-up including labs, imaging, EKG ordered in triage, if performed, were reviewed.    Labs/EKG: None ordered  Imaging: X-ray of the finger, agree negative.  Medications/Fluids: None ordered  Most recent vital signs reviewed and are as follows: BP (!) 154/112 (BP Location: Right Arm)   Pulse 89   Temp 98.1 F (36.7 C) (Oral)   Resp 18   LMP 04/21/2024 (Exact Date)   SpO2 99%   Initial impression: Subungual hematoma  Discussed nail trephination with patient, risks and benefits, she agrees to proceed.  I used a high-temperature fine-tipped cautery device to make 2 small holes in the nail.  Patient had good drainage of dark red blood which then stopped after a bit of pressure.  Home treatment plan: RICE protocol, Tylenol  for pain given pregnancy  Return instructions discussed with patient: Worsening  pain, redness, swelling  Follow-up instructions discussed with patient: Follow-up with PCP in 1 week as needed.                                  Medical Decision Making Amount and/or Complexity of Data Reviewed Radiology: ordered.   Patient with subungual hematoma of the finger, nail is fully secured.  Subungual hematoma was drained.  X-ray was negative for fracture.  Patient may have small nailbed laceration, however I do not feel that removal of the nail itself and the trauma related to that would be worth the benefit at this time.  Patient tolerated  trephination well.     Final diagnoses:  Crushing injury of finger of right hand  Subungual hematoma of digit of hand, initial encounter    ED Discharge Orders     None          Desiderio Chew, PA-C 06/17/24 1027  "

## 2024-06-17 NOTE — Discharge Instructions (Signed)
 Please read and follow all provided instructions.  Your diagnoses today include:  1. Crushing injury of finger of right hand   2. Subungual hematoma of digit of hand, initial encounter     Tests performed today include: An x-ray of the affected area - does NOT show any broken bones Vital signs. See below for your results today.   Medications prescribed:  None  Take any prescribed medications only as directed.  Home care instructions:  Follow any educational materials contained in this packet Given that you are pregnant it would be best to take Tylenol  for pain Follow R.I.C.E. Protocol: R - rest your injury  I  - use ice on injury without applying directly to skin C - compress injury with bandage or splint E - elevate the injury as much as possible  Follow-up instructions: Please follow-up with your primary care provider if you continue to have significant pain in 1 week.   Return instructions:  Please return if your fingers are numb or tingling, appear gray or blue, or you have severe pain (also elevate the arm and loosen splint or wrap if you were given one) Please return to the Emergency Department if you experience worsening symptoms.  Please return if you have any other emergent concerns.  Additional Information:  Your vital signs today were: BP (!) 154/112 (BP Location: Right Arm)   Pulse 89   Temp 98.1 F (36.7 C) (Oral)   Resp 18   LMP 04/21/2024 (Exact Date)   SpO2 99%  If your blood pressure (BP) was elevated above 135/85 this visit, please have this repeated by your doctor within one month. --------------

## 2024-06-17 NOTE — ED Triage Notes (Addendum)
 Slammed R ring finger in door this morning. Bruising noted to fingernail, No deformity,   Approx [redacted] weeks pregnant.

## 2024-06-26 ENCOUNTER — Ambulatory Visit (INDEPENDENT_AMBULATORY_CARE_PROVIDER_SITE_OTHER): Payer: Self-pay | Admitting: Nurse Practitioner

## 2024-06-26 ENCOUNTER — Encounter: Payer: Self-pay | Admitting: Nurse Practitioner

## 2024-06-26 VITALS — BP 126/64 | HR 97 | Temp 97.1°F | Wt 227.4 lb

## 2024-06-26 DIAGNOSIS — Z1329 Encounter for screening for other suspected endocrine disorder: Secondary | ICD-10-CM

## 2024-06-26 DIAGNOSIS — Z13220 Encounter for screening for lipoid disorders: Secondary | ICD-10-CM

## 2024-06-26 DIAGNOSIS — Z0000 Encounter for general adult medical examination without abnormal findings: Secondary | ICD-10-CM | POA: Diagnosis not present

## 2024-06-26 DIAGNOSIS — F418 Other specified anxiety disorders: Secondary | ICD-10-CM | POA: Diagnosis not present

## 2024-06-26 NOTE — Patient Instructions (Signed)
 Pregnancy: Healthy Eating While you're pregnant, your body needs extra nutrition for your growing baby. You also need more vitamins and minerals, such as folic acid, calcium, iron, and vitamin D. Eating a balanced diet is important for both you and your baby. Your need for extra calories will change during pregnancy. During the first 3 months of pregnancy, called the first trimester, you don't need more calories. During the second trimester, you'll need about 340 extra calories a day. During the third trimester, you'll need about 450 extra calories a day. If you're carrying more than one baby, talk with your health care provider or a dietitian to learn more about your specific eating needs. What are tips for eating healthy during pregnancy? Meal planning  Eating smaller meals throughout the day may help manage some side effects common in pregnancy, like heartburn and reflux. Eat a variety of foods. Be sure to include many types of fruits and vegetables. Two or more servings of fish are recommended each week. Choose fish that are lower in mercury, such as salmon and pollock. Limit foods that have empty calories. These are foods that have little nutritional value, such as sweets, desserts, candies, and drinks with sugar in them. Drinks that have caffeine are OK to drink, but it's better to avoid caffeine. Limit your total caffeine intake to less than 200 mg each day, or the limit you're told by your provider. Be aware that 200 mg of caffeine is 12 oz or 355 mL of coffee, tea, or soda. General information Take a prenatal vitamin to help meet your vitamin and mineral needs during pregnancy. This includes your need for folic acid, iron, calcium, and vitamin D. Do not try to lose weight or go on a diet during pregnancy. Food safety  Wash your hands before you eat and after you prepare raw meat. Wash all fruits and vegetables well before peeling or eating. Make sure that all meats, poultry, and  eggs are cooked to food-safe temperatures or well-done. Taking these actions can help keep your food safe and protect you and your baby from dangerous food illnesses. Ask your provider for more information. What foods should I eat? Fruits All fruits. Eat a variety of colors and types of fruit. Remember to wash your fruits well before peeling or eating. Vegetables All vegetables. Eat a variety of colors and types of vegetables. Remember to wash your vegetables well before peeling or eating. Grains All grains. Choose whole grains, such as whole-wheat bread, oatmeal, or brown rice. Meats and other protein foods Lean meats, including chicken, [place], and lean cuts of beef, veal, or pork. Fish that is higher in omega-3 fatty acids and lower in mercury, such as salmon, herring, mussels, trout, sardines, pollock, shrimp, crab, and lobster. Tofu. Tempeh. Beans. Eggs. Peanut butter and other nut butters. Dairy Pasteurized milk and milk alternatives, such as soy milk. Pasteurized yogurt and pasteurized cheese. Cottage cheese. Sour cream. Beverages Water. Juices that contain 100% fruit juice or vegetable juice. Caffeine-free teas and decaffeinated coffee. Fats and oils Fats and oils are OK to include in moderation. Sweets and desserts Sweets and desserts are OK to include in moderation. Seasoning and other foods All pasteurized condiments. The items listed above may not be all the foods and drinks you can have. Talk with a dietitian to learn more. What does 340 extra calories look like? Healthy snacks that give you 340 more calories a day could be: Peanut butter and jelly with milk: 8 oz (237 mL) of  low-fat milk. Peanut butter and jelly sandwich made with: 1 slice of whole-wheat bread. 2 teaspoons (10 g) of peanut butter. Yogurt and berries: 1 cup (245 g) of [place] yogurt. 1 cup (150 g) of berries. 2 tablespoons (30 g) of chopped nuts, such as almonds or walnuts. Avocado toast: 1 slice of  whole-wheat bread. 1/2 medium avocado (70 g). 1 large egg (50 g). What foods should I avoid? Fruits Raw (unpasteurized) fruit juices. Vegetables Unpasteurized vegetable juices. Meats and other protein foods Precooked or cured meat, such as bologna, hot dogs, sausages, or meat loaves. (If you must eat those meats, reheat them until they are steaming hot.) Refrigerated pate, meat spreads from a meat counter, or smoked seafood that's found in the refrigerated section of a store. Raw or undercooked meats, poultry, and eggs. Raw fish, such as sushi or sashimi. Fish that have high mercury content, such as tilefish, shark, swordfish, and king mackerel. Dairy Unpasteurized or raw milk and any foods that are made from them. Some of these may be: Homemade yogurts or puddings. Soft cheeses such as: Feta. Queso blanco or fresco. Pharmacist, hospital or [place]. Blue-veined cheeses. Some of these types of cheeses may be made with pasteurized milk. Check the label. If pasteurized milk is used, they are OK to eat during pregnancy. Deli foods Premade foods from a store or deli, like chicken salad, coleslaw, or egg salad. These are riskier for food illness than fresh or homemade salads. Beverages Alcohol. Sugar-sweetened drinks, such as sodas or teas. Energy drinks. Seasoning and other foods Homemade fermented foods and drinks, such as: Pickles. Sauerkraut. Kombucha. Store-bought pasteurized versions of these are OK. The items listed above may not be all the foods and drinks you should avoid. Talk with a dietitian to learn more. Where to find more information To learn more, go to: Centers for Disease Control and Prevention at TonerPromos.no. Click Search and type food choices for pregnancy. Find the link you need. MyPlate at http://pittman-dennis.biz/. This information is not intended to replace advice given to you by your health care provider. Make sure you discuss any questions you have with your health care  provider. Document Revised: 05/23/2023 Document Reviewed: 05/23/2023 Elsevier Patient Education  2025 [employer].

## 2024-06-26 NOTE — Progress Notes (Signed)
 "  Subjective   Patient ID: Natasha Beard, female    DOB: 1996/03/20, 29 y.o.   MRN: [id number]  Chief Complaint  Patient presents with   Annual Exam    Found out was pregnant.     Referring provider: Oley Bascom RAMAN, NP  Chrisoula Zegarra is a 29 y.o. female with Past Medical History: No date: Allergy      Comment:  Apples, milk chocolate No date: Anxiety No date: Arthritis No date: Depression No date: Eczema   HPI  Patient presents today for a physical.  Overall she is doing well.  Recently found out that she was pregnant.  She does have an appointment to follow for initial OB visit on 07/09/2024.  Has been taking prenatal vitamins.  Denies f/c/s, n/v/d, hemoptysis, PND, leg swelling. Denies chest pain or edema.     Allergies[1]  Immunization History  Administered Date(s) Administered    sv, Bivalent, Protein Subunit Rsvpref,pf [place]) 05/16/2023   DTaP 05/29/2001, 08/15/2001   HPV Quadrivalent 01/08/2008, 08/02/2010, 02/16/2012   Hepatitis A, Ped/Adol-2 Dose 01/08/2008   Hepatitis B, PED/ADOLESCENT 05/29/2001, 08/15/2001, 02/04/2002   IPV 05/29/2001, 08/15/2001, 02/04/2002   Influenza, Seasonal, Injecte, Preservative Fre 04/04/2023   MMR 05/29/2001, 08/15/2001   Meningococcal Conjugate 08/02/2010   Td 03/13/2002, 02/12/2003   Tdap 01/08/2008, 03/07/2023   Varicella 05/29/2001, 01/08/2008    Tobacco History: Tobacco Use History[2] Counseling given: Not Answered   Outpatient Encounter Medications as of 06/26/2024  Medication Sig   Prenatal MV & Min w/FA-DHA (PRENATAL GUMMIES PO) Take 2 tablets by mouth daily.   fluconazole  (DIFLUCAN ) 150 MG tablet Take 1 tablet (150 mg total) by mouth daily. (Patient not taking: Reported on 06/26/2024)   No facility-administered encounter medications on file as of 06/26/2024.    Review of Systems  Review of Systems  Constitutional: Negative.   HENT: Negative.    Cardiovascular: Negative.   Gastrointestinal: Negative.    Allergic/Immunologic: Negative.   Neurological: Negative.   Psychiatric/Behavioral: Negative.       Objective:   BP 126/64   Pulse 97   Temp (!) 97.1 F (36.2 C)   Wt 227 lb 6.4 oz (103.1 kg)   LMP 04/21/2024 (Exact Date)   SpO2 98%   BMI 35.62 kg/m   Wt Readings from Last 5 Encounters:  06/26/24 227 lb 6.4 oz (103.1 kg)  06/06/24 230 lb 14.4 oz (104.7 kg)  05/31/24 243 lb 4.8 oz (110.4 kg)  05/02/24 232 lb 9.6 oz (105.5 kg)  01/17/24 216 lb 12.8 oz (98.3 kg)     Physical Exam Vitals and nursing note reviewed.  Constitutional:      General: She is not in acute distress.    Appearance: She is well-developed.  Cardiovascular:     Rate and Rhythm: Normal rate and regular rhythm.  Pulmonary:     Effort: Pulmonary effort is normal.     Breath sounds: Normal breath sounds.  Neurological:     Mental Status: She is alert and oriented to person, place, and time.       Assessment & Plan:   Routine health maintenance -     CBC -     Comprehensive metabolic panel with GFR  Mixed anxiety and depressive disorder  Lipid screening -     Lipid panel  Thyroid disorder screen -     TSH     Return in about 1 year (around 06/26/2025) for Physical.    Bascom RAMAN Oley, NP 06/26/2024     [  1]  Allergies Allergen Reactions   Apple Juice     Patient allergic to Apple   Chocolate     In moderation   Erythromycin  Swelling  [2]  Social History Tobacco Use  Smoking Status Never   Passive exposure: Past  Smokeless Tobacco Never   "

## 2024-06-27 LAB — COMPREHENSIVE METABOLIC PANEL WITH GFR
ALT: 11 IU/L (ref 0–32)
AST: 12 IU/L (ref 0–40)
Albumin: 3.6 g/dL — ABNORMAL LOW (ref 4.0–5.0)
Alkaline Phosphatase: 83 IU/L (ref 41–116)
BUN/Creatinine Ratio: 9 (ref 9–23)
BUN: 4 mg/dL — ABNORMAL LOW (ref 6–20)
Bilirubin Total: 0.2 mg/dL (ref 0.0–1.2)
CO2: 19 mmol/L — ABNORMAL LOW (ref 20–29)
Calcium: 8.8 mg/dL (ref 8.7–10.2)
Chloride: 104 mmol/L (ref 96–106)
Creatinine, Ser: 0.47 mg/dL — ABNORMAL LOW (ref 0.57–1.00)
Globulin, Total: 2.2 g/dL (ref 1.5–4.5)
Glucose: 100 mg/dL — ABNORMAL HIGH (ref 70–99)
Potassium: 3.9 mmol/L (ref 3.5–5.2)
Sodium: 137 mmol/L (ref 134–144)
Total Protein: 5.8 g/dL — ABNORMAL LOW (ref 6.0–8.5)
eGFR: 132 mL/min/1.73

## 2024-06-27 LAB — CBC
Hematocrit: 42.7 % (ref 34.0–46.6)
Hemoglobin: 13.9 g/dL (ref 11.1–15.9)
MCH: 31.1 pg (ref 26.6–33.0)
MCHC: 32.6 g/dL (ref 31.5–35.7)
MCV: 96 fL (ref 79–97)
Platelets: 164 x10E3/uL (ref 150–450)
RBC: 4.47 x10E6/uL (ref 3.77–5.28)
RDW: 12.8 % (ref 11.7–15.4)
WBC: 8 x10E3/uL (ref 3.4–10.8)

## 2024-06-27 LAB — LIPID PANEL
Chol/HDL Ratio: 2.5 ratio (ref 0.0–4.4)
Cholesterol, Total: 140 mg/dL (ref 100–199)
HDL: 56 mg/dL
LDL Chol Calc (NIH): 67 mg/dL (ref 0–99)
Triglycerides: 91 mg/dL (ref 0–149)
VLDL Cholesterol Cal: 17 mg/dL (ref 5–40)

## 2024-06-27 LAB — TSH: TSH: 0.463 u[IU]/mL (ref 0.450–4.500)

## 2024-06-30 ENCOUNTER — Ambulatory Visit: Payer: Self-pay | Admitting: Nurse Practitioner

## 2024-07-02 ENCOUNTER — Telehealth

## 2024-07-02 DIAGNOSIS — Z3401 Encounter for supervision of normal first pregnancy, first trimester: Secondary | ICD-10-CM

## 2024-07-02 DIAGNOSIS — Z3490 Encounter for supervision of normal pregnancy, unspecified, unspecified trimester: Secondary | ICD-10-CM | POA: Insufficient documentation

## 2024-07-02 DIAGNOSIS — Z3A11 11 weeks gestation of pregnancy: Secondary | ICD-10-CM | POA: Diagnosis not present

## 2024-07-02 DIAGNOSIS — Z3400 Encounter for supervision of normal first pregnancy, unspecified trimester: Secondary | ICD-10-CM | POA: Insufficient documentation

## 2024-07-02 NOTE — Progress Notes (Addendum)
 New OB Intake  I connected with Natasha Beard  on 07/02/2024 at  1:15 PM EST by telephone Video Visit and verified that I am speaking with the correct person using two identifiers. Nurse is located at [facility] and pt is located at [place], [place].  I discussed the limitations, risks, security and privacy concerns of performing an evaluation and management service by telephone and the availability of in person appointments. I also discussed with the patient that there may be a patient responsible charge related to this service. The patient expressed understanding and agreed to proceed.  I explained I am completing New OB Intake today. We discussed EDD of 01/16/25 based on US  at 7.1 weeks. Pt is G2P1001. I reviewed her allergies, medications and Medical/Surgical/OB history.    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 Discussed the nature of our practice with multiple providers including residents and students as well as female and female providers. Due to the size of the practice, the delivering provider may not be the same as those providing prenatal care.   Patient is interested in water birth.  MyChart/Babyscripts MyChart access verified. I explained pt will have some visits in office and some virtually. Babyscripts instructions given and order placed. Patient verifies receipt of registration text/e-mail. Account successfully created and app downloaded. If patient is a candidate for Optimized scheduling, add to sticky note.   Blood Pressure Cuff/Weight Scale Patient has a blood pressure cuff at home. Explained after first prenatal appt pt will check  weekly and document in Babyscripts. Patient does not have weight scale; patient may purchase if they desire to track weight weekly in Babyscripts.  Anatomy US  Explained first scheduled US  will be around 19 weeks. Anatomy US  scheduled for 08/05/24 at 0715.  Is patient a CenteringPregnancy candidate?  Declined Declined due to wishes to see Natasha Beard, CNM    Is patient a Mom+Baby Combined Care candidate?  Not a candidate   If accepted, confirm patient does not intend to move from the area for at least 12 months, then notify Mom+Baby staff  Is patient a candidate for Babyscripts Optimization? Yes, patient accepted    First visit review I reviewed new OB appt with patient. Explained pt will be seen by Natasha Beard, CNM at first visit. Discussed Natasha Beard genetic screening with patient. Yes Panorama.. Routine prenatal labs is needed for New OB intake. Patient is concern of kidney levels. Advised patient that CMP may be ordered to check on levels.  Last Pap Diagnosis  Date Value Ref Range Status  11/22/2022   Final   - Negative for intraepithelial lesion or malignancy (NILM)    Natasha Pendleton, RN 07/02/2024  1:46 PM

## 2024-07-04 ENCOUNTER — Encounter: Payer: Self-pay | Admitting: Certified Nurse Midwife

## 2024-07-07 MED ORDER — BLOOD PRESSURE KIT DEVI: 1.0000 | Freq: Every day | 0 refills | Status: AC

## 2024-07-09 ENCOUNTER — Other Ambulatory Visit: Payer: Self-pay

## 2024-07-09 ENCOUNTER — Ambulatory Visit: Admitting: Certified Nurse Midwife

## 2024-07-09 ENCOUNTER — Encounter: Payer: Self-pay | Admitting: Certified Nurse Midwife

## 2024-07-09 ENCOUNTER — Telehealth: Payer: Self-pay | Admitting: Nurse Practitioner

## 2024-07-09 VITALS — BP 134/76 | HR 95 | Wt 226.1 lb

## 2024-07-09 DIAGNOSIS — Z3491 Encounter for supervision of normal pregnancy, unspecified, first trimester: Secondary | ICD-10-CM

## 2024-07-09 DIAGNOSIS — Z3A12 12 weeks gestation of pregnancy: Secondary | ICD-10-CM

## 2024-07-09 DIAGNOSIS — R7989 Other specified abnormal findings of blood chemistry: Secondary | ICD-10-CM

## 2024-07-09 NOTE — Telephone Encounter (Signed)
 Pt confirmed appt

## 2024-07-09 NOTE — Patient Instructions (Signed)
   Considering Waterbirth? Guide for patients at [facility]) Why consider waterbirth? Gentle birth for babies  Less pain medicine used in labor  May allow for passive descent/less pushing  May reduce perineal tears  More mobility and instinctive maternal position changes  Increased maternal relaxation   Is waterbirth safe? What are the risks of infection, drowning or other complications? Infection:  Very low risk (3.7 % for tub vs 4.8% for bed)  7 in 8000 waterbirths with documented infection  Poorly cleaned equipment most common cause  Slightly lower group B strep transmission rate  Drowning  Maternal:  Very low risk  Related to seizures or fainting  Newborn:  Very low risk. No evidence of increased risk of respiratory problems in multiple large studies  Physiological protection from breathing under water  Avoid underwater birth if there are any fetal complications  Once baby's head is out of the water, keep it out.  Birth complication  Some reports of cord trauma, but risk decreased by bringing baby to surface gradually  No evidence of increased risk of shoulder dystocia. Mothers can usually change positions faster in water than in a bed, possibly aiding the maneuvers to free the shoulder.   There are 2 things you MUST do to have a waterbirth with [facility]: Attend a waterbirth class at [facility] at [facility]   3rd Wednesday of every month from 7-9 pm (virtual during COVID) [employer] at [email or web address] or [email or web address] or by calling [phone] Bring us  the certificate from the class to your prenatal appointment or send via MyChart Meet with a midwife at 36 weeks* to see if you can still plan a waterbirth and to sign the consent.   *We also recommend that you schedule as many of your prenatal visits with a midwife as possible.    Helpful information: You may want to bring a bathing suit top to the hospital  to wear during labor but this is optional.  All other supplies are provided by the hospital. Please arrive at the hospital with signs of active labor, and do not wait at home until late in labor. It takes 45 min- 1 hour for fetal monitoring, and check in to your room to take place, plus transport and filling of the waterbirth tub.    Things that would prevent you from having a waterbirth: Premature, <37wks  Previous cesarean birth  Presence of thick meconium-stained fluid  Multiple gestation (Twins, triplets, etc.)  Uncontrolled diabetes or gestational diabetes requiring medication  Hypertension diagnosed in pregnancy or preexisting hypertension (gestational hypertension, preeclampsia, or chronic hypertension) Fetal growth restriction (your baby measures less than 10th percentile on ultrasound) Heavy vaginal bleeding  Non-reassuring fetal heart rate  Active infection (MRSA, etc.). Group B Strep is NOT a contraindication for waterbirth.  If your labor has to be induced and induction method requires continuous monitoring of the baby's heart rate  Other risks/issues identified by your obstetrical provider   Please remember that birth is unpredictable. Under certain unforeseeable circumstances your provider may advise against giving birth in the tub. These decisions will be made on a case-by-case basis and with the safety of you and your baby as our highest priority.    Updated 09/21/21

## 2024-07-09 NOTE — Progress Notes (Signed)
 "  History:   Natasha Beard is a 29 y.o. G2P1001 at [redacted]w[redacted]d by LMP, early ultrasound being seen today for her first obstetrical visit.  Her obstetrical history is significant for one uncomplicated pregnancy with subsequent NSVD. Patient does intend to breast feed. Pregnancy history fully reviewed.  Patient reports no complaints.      HISTORY: OB History  Gravida Para Term Preterm AB Living  2 1 1  0 0 1  SAB IAB Ectopic Multiple Live Births  0 0 0 0 1    # Outcome Date GA Lbr Len/2nd Weight Sex Type Anes PTL Lv  2 Current           1 Term 06/07/23 [redacted]w[redacted]d 32:18 7 lb 1.2 oz (3.21 kg) M Vag-Vacuum EPI  LIV     Birth Comments: WDL     Name: Natasha Beard     Apgar1: 8  Apgar5: 10    Last pap smear was done 11/22/22 and was normal  Past Medical History:  Diagnosis Date   Allergy     Apples, milk chocolate   Anxiety    Arthritis    Depression    Eczema    Generalized abdominal pain 05/02/2024   Migraine, unspecified, not intractable, without status migrainosus 07/13/2023   Palpitations 11/23/2022   Past Surgical History:  Procedure Laterality Date   NO PAST SURGERIES     Family History  Problem Relation Age of Onset   Anemia Mother    Diabetes Father    Seizures Father    Breast cancer Maternal Aunt    Breast cancer Maternal Grandmother    Social History[1] Allergies[2] Medications Ordered Prior to Encounter[3]  Review of Systems Pertinent items noted in HPI and remainder of comprehensive ROS otherwise negative. Physical Exam:   Vitals:   07/09/24 1455  BP: 134/76  Pulse: 95  Weight: 226 lb 1.6 oz (102.6 kg)   Fetal Heart Rate (bpm): 160  Constitutional: Well-developed, well-nourished pregnant female in no acute distress.  HEENT: PERRLA Skin: normal color and turgor, no rash Cardiovascular: normal rate & rhythm, warm and well perfused Respiratory: normal effort, no problems with respiration noted GI: Abd soft, non-distended MS: Extremities nontender, no  edema, normal ROM Neurologic: Alert and oriented x 4.  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 Encounter for supervision of low-risk pregnancy in first trimester (Primary) - Doing well, surprised by but excited for the pregnancy - PANORAMA PRENATAL TEST - CBC/D/Plt+RPR+Rh+ABO+RubIgG... - HgB A1c - Comp Met (CMET) - Anemia Profile B - Vitamin D  (25 hydroxy) - Culture, OB Urine  2. [redacted] weeks gestation of pregnancy - Routine OB care, will offer AFP at next visit  3. Low serum creatine - Was told by PCP to go on a low carb, high protein diet but this caused gallbladder symptoms - Has a visit scheduled next week for gallbladder ultrasound - Advised to focus diet on high protein, high fiber and low animal fats to avoid gallbladder issues and improve serum protein/creatinine etc. eGFR has been stable.  4. Initial obstetric visit in first trimester - Initial labs drawn. - Continue prenatal vitamins. - Problem  list reviewed and updated. - Genetic Screening discussed, First trimester screen, Quad screen, and NIPS: ordered. - Ultrasound discussed; fetal anatomic survey: ordered. - Anticipatory guidance about prenatal visits given including labs, ultrasounds, and testing. - Discussed usage of Babyscripts and virtual visits as additional source of managing and completing prenatal visits in midst of coronavirus and pandemic.   - Encouraged to complete MyChart Registration for her ability to review results, send requests, and have questions addressed.  - The nature of [place] [facility]Faculty Practice with multiple  MDs and Advanced Practice Providers was explained to patient; also emphasized that residents, students are part of our team. - Routine obstetric precautions reviewed. Encouraged to seek out care at office or emergency room [facility] preferred) for urgent and/or emergent concerns.  Return in about 4 weeks (around 08/06/2024) for IN-PERSON, LOB.    Future Appointments  Date Time Provider Department Center  07/11/2024  4:10 PM Placey, Ronal Caldron, NP [facility] [address]  07/17/2024 10:30 AM [facility] US  1 [id number] [facility] [address]  08/05/2024  7:15 AM [facility] PROVIDER 1 [facility]  08/05/2024  7:30 AM [facility] US2 [facility]  08/13/2024  3:15 PM Vannie [name] SAUNDERS, CNM [facility]  06/29/2025  1:00 PM Oley Bascom RAMAN, NP [name] Vannie, MSN, CNM, IBCLC Certified Nurse Midwife, [facility]     [1]  Social History Tobacco Use   Smoking status: Never    Passive exposure: Past   Smokeless tobacco: Never  Vaping Use   Vaping status: Never Used  Substance Use Topics   Alcohol use: Not Currently   Drug use: Not Currently    Types: Marijuana    Comment: +UDS 12/11/22 THC  [2]  Allergies Allergen Reactions   Apple Juice     Patient allergic to Apple   Chocolate     In moderation   Erythromycin  Swelling  [3]  Current Outpatient Medications on File Prior to Visit  Medication Sig Dispense Refill   Blood Pressure Monitoring (BLOOD PRESSURE KIT) DEVI 1 Device by Does not apply route daily. 1 each 0   Prenatal MV & Min w/FA-DHA (PRENATAL GUMMIES PO) Take 2 tablets by mouth daily.     No current facility-administered medications on file prior to visit.   "

## 2024-07-10 ENCOUNTER — Encounter: Payer: Self-pay | Admitting: *Deleted

## 2024-07-10 LAB — COMPREHENSIVE METABOLIC PANEL WITH GFR
ALT: 14 IU/L (ref 0–32)
AST: 11 IU/L (ref 0–40)
Albumin: 3.9 g/dL — ABNORMAL LOW (ref 4.0–5.0)
Alkaline Phosphatase: 84 IU/L (ref 41–116)
BUN/Creatinine Ratio: 13 (ref 9–23)
BUN: 6 mg/dL (ref 6–20)
Bilirubin Total: 0.3 mg/dL (ref 0.0–1.2)
CO2: 19 mmol/L — ABNORMAL LOW (ref 20–29)
Calcium: 9.5 mg/dL (ref 8.7–10.2)
Chloride: 101 mmol/L (ref 96–106)
Creatinine, Ser: 0.48 mg/dL — ABNORMAL LOW (ref 0.57–1.00)
Globulin, Total: 2.3 g/dL (ref 1.5–4.5)
Glucose: 86 mg/dL (ref 70–99)
Potassium: 4.3 mmol/L (ref 3.5–5.2)
Sodium: 132 mmol/L — ABNORMAL LOW (ref 134–144)
Total Protein: 6.2 g/dL (ref 6.0–8.5)
eGFR: 131 mL/min/1.73

## 2024-07-10 LAB — HEMOGLOBIN A1C
Est. average glucose Bld gHb Est-mCnc: 105 mg/dL
Hgb A1c MFr Bld: 5.3 % (ref 4.8–5.6)

## 2024-07-10 LAB — CBC/D/PLT+RPR+RH+ABO+RUBIGG...
Antibody Screen: NEGATIVE
Basophils Absolute: 0 x10E3/uL (ref 0.0–0.2)
Basos: 0 %
EOS (ABSOLUTE): 0.1 x10E3/uL (ref 0.0–0.4)
Eos: 1 %
HCV Ab: NONREACTIVE
HIV Screen 4th Generation wRfx: NONREACTIVE
Hematocrit: 44.2 % (ref 34.0–46.6)
Hemoglobin: 14 g/dL (ref 11.1–15.9)
Hepatitis B Surface Ag: NEGATIVE
Immature Grans (Abs): 0 x10E3/uL (ref 0.0–0.1)
Immature Granulocytes: 0 %
Lymphocytes Absolute: 2.1 x10E3/uL (ref 0.7–3.1)
Lymphs: 24 %
MCH: 30.8 pg (ref 26.6–33.0)
MCHC: 31.7 g/dL (ref 31.5–35.7)
MCV: 97 fL (ref 79–97)
Monocytes Absolute: 0.7 x10E3/uL (ref 0.1–0.9)
Monocytes: 8 %
Neutrophils Absolute: 5.8 x10E3/uL (ref 1.4–7.0)
Neutrophils: 67 %
Platelets: 177 x10E3/uL (ref 150–450)
RBC: 4.55 x10E6/uL (ref 3.77–5.28)
RDW: 13.2 % (ref 11.7–15.4)
RPR Ser Ql: NONREACTIVE
Rh Factor: POSITIVE
Rubella Antibodies, IGG: 15.7 {index}
WBC: 8.7 x10E3/uL (ref 3.4–10.8)

## 2024-07-10 LAB — ANEMIA PROFILE B
Ferritin: 54 ng/mL (ref 15–150)
Folate: 19.8 ng/mL
Iron Saturation: 30 % (ref 15–55)
Iron: 126 ug/dL (ref 27–159)
Retic Ct Pct: 2.5 % (ref 0.6–2.6)
Total Iron Binding Capacity: 420 ug/dL (ref 250–450)
UIBC: 294 ug/dL (ref 131–425)
Vitamin B-12: 612 pg/mL (ref 232–1245)

## 2024-07-10 LAB — HCV INTERPRETATION

## 2024-07-10 LAB — VITAMIN D 25 HYDROXY (VIT D DEFICIENCY, FRACTURES): Vit D, 25-Hydroxy: 18 ng/mL — ABNORMAL LOW (ref 30.0–100.0)

## 2024-07-11 ENCOUNTER — Encounter: Payer: Self-pay | Admitting: Pediatrics

## 2024-07-11 ENCOUNTER — Encounter: Payer: Self-pay | Admitting: *Deleted

## 2024-07-11 ENCOUNTER — Encounter: Admitting: Family Medicine

## 2024-07-11 ENCOUNTER — Ambulatory Visit: Payer: Self-pay | Attending: *Deleted | Admitting: *Deleted

## 2024-07-11 VITALS — BP 133/81 | HR 89 | Temp 98.2°F | Ht 67.0 in | Wt 233.0 lb

## 2024-07-11 DIAGNOSIS — R10811 Right upper quadrant abdominal tenderness: Secondary | ICD-10-CM

## 2024-07-11 LAB — URINE CULTURE, OB REFLEX

## 2024-07-11 LAB — CULTURE, OB URINE

## 2024-07-11 NOTE — Progress Notes (Signed)
 "   Patient ID: Natasha Beard, female    DOB: [birth year]  MRN: [id number]  CC: Acute Visit (Stomach pain X1 month )   Subjective: Natasha Beard is a 29 y.o. female who presents for right upper quadrant abdominal tenderness in the setting of 13-week pregnancy.  Differentials include fatty liver from pregnancy and gallbladder disease. She has a history of GERD but does not think that this is the issue causing the right upper quadrant abdominal pain. She reports that she was told to change her diet to a high-protein low carbohydrate because of a creatinine of 0.47.   Due to this diet change she thinks that she has been  eating more fatty foods.  Subsequently, she has developed right upper quadrant abdominal pain with no nausea, diarrhea, constipation, fever or chills.  She has seen her midwife who has ordered an ultrasound of the abdomen to rule out gallbladder disease.  She also her ANA CBC and a CMP (among other tests) on 07/09/2024.  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/2023     Medications Ordered Prior to Encounter[1]  Allergies[2]  Social History   Socioeconomic History   Marital status: Single    Spouse name: Not on file   Number of children: 1   Years of education: Not on file   Highest education level: Associate degree: occupational, scientist, product/process development, or vocational program  Occupational History   Not on file  Tobacco Use   Smoking status: Never    Passive exposure: Past   Smokeless tobacco: Never   Vaping Use   Vaping status: Never Used  Substance and Sexual Activity   Alcohol use: Not Currently   Drug use: Not Currently    Types: Marijuana    Comment: +UDS 12/11/22 THC   Sexual activity: Yes    Birth control/protection: None    Comment: Depo previouslt  Other Topics Concern   Not on file  Social History Narrative   Lives with her boyfriend    Social Drivers of Health   Tobacco Use: Low Risk (07/02/2024)   Patient History    Smoking Tobacco Use: Never    Smokeless Tobacco Use: Never    Passive Exposure: Past  Financial Resource Strain: Medium Risk (05/02/2024)   Overall Financial Resource Strain (CARDIA)    Difficulty of Paying Living Expenses: Somewhat hard  Food Insecurity: No Food Insecurity (07/09/2024)   Epic    Worried About Programme Researcher, Broadcasting/film/video in the Last Year: Never true    Ran Out of Food in the Last Year: Never true  Recent Concern: Food Insecurity - Food Insecurity Present (05/02/2024)   Epic    Worried About Programme Researcher, Broadcasting/film/video in the Last Year: Sometimes true    Ran Out of Food in the Last Year: Never true  Transportation Needs: No Transportation Needs (07/09/2024)   Epic    Lack of Transportation (Medical): No    Lack of Transportation (Non-Medical): No  Recent Concern: Transportation  Needs - Unmet Transportation Needs (05/02/2024)   Epic    Lack of Transportation (Medical): Yes    Lack of Transportation (Non-Medical): Yes  Physical Activity: Insufficiently Active (05/02/2024)   Exercise Vital Sign    Days of Exercise per Week: 2 days    Minutes of Exercise per Session: 30 min  Stress: Stress Concern Present (05/02/2024)   [employer] of Occupational Health - Occupational Stress Questionnaire    Feeling of Stress: Very much  Social Connections: Socially Isolated (05/02/2024)   Social Connection and Isolation Panel    Frequency of Communication with Friends and Family: Once a week    Frequency of Social Gatherings with Friends and Family:  Never    Attends Religious Services: Never    Database Administrator or Organizations: No    Attends Engineer, Structural: Not on file    Marital Status: Living with partner  Intimate Partner Violence: Not At Risk (06/26/2024)   Epic    Fear of Current or Ex-Partner: No    Emotionally Abused: No    Physically Abused: No    Sexually Abused: No  Depression (PHQ2-9): Low Risk (07/09/2024)   Depression (PHQ2-9)    PHQ-2 Score: 4  Recent Concern: Depression (PHQ2-9) - High Risk (05/02/2024)   Depression (PHQ2-9)    PHQ-2 Score: 19  Alcohol Screen: Low Risk (05/02/2024)   Alcohol Screen    Last Alcohol Screening Score (AUDIT): 1  Housing: Low Risk (06/26/2024)   Epic    Unable to Pay for Housing in the Last Year: No    Number of Times Moved in the Last Year: 0    Homeless in the Last Year: No  Recent Concern: Housing - High Risk (05/02/2024)   Epic    Unable to Pay for Housing in the Last Year: Yes    Number of Times Moved in the Last Year: 0    Homeless in the Last Year: Patient declined  Utilities: Not At Risk (06/26/2024)   Epic    Threatened with loss of utilities: No  Health Literacy: Not on file    Family History  Problem Relation Age of Onset   Anemia Mother    Diabetes Father    Seizures Father    Breast cancer Maternal Aunt    Breast cancer Maternal Grandmother     Past Surgical History:  Procedure Laterality Date   NO PAST SURGERIES      ROS: Review of Systems Negative except as stated above  PHYSICAL EXAM: BP 133/81 (BP Location: Left Arm, Patient Position: Sitting, Cuff Size: Normal)   Pulse 89   Temp 98.2 F (36.8 C) (Oral)   Ht 5' 7 (1.702 m)   Wt 105.7 kg   LMP 04/21/2024 (Exact Date)   SpO2 98%   BMI 36.49 kg/m   Physical Exam Vitals and nursing note reviewed.  Cardiovascular:     Rate and Rhythm: Normal rate and regular rhythm.  Pulmonary:     Effort: Pulmonary effort is normal.     Breath sounds: Normal breath sounds.  Abdominal:      Comments: Abdomen protuberant due to 13-week pregnancy.  Bowel sounds present all 4 quadrants.  No masses organomegaly, no rebound or peritonitis.  She does have right upper quadrant tenderness to palpation  Skin:    General: Skin is warm and dry.  Neurological:     Mental Status: She is alert. Mental status is at baseline.  Latest Ref Rng & Units 07/09/2024    3:23 PM 06/26/2024    1:43 PM 02/07/2023    5:31 PM  CMP  Glucose 70 - 99 mg/dL 86  899  76   BUN 6 - 20 mg/dL 6  4  5    Creatinine 0.57 - 1.00 mg/dL 9.51  9.52  9.58   Sodium 134 - 144 mmol/L 132  137  133   Potassium 3.5 - 5.2 mmol/L 4.3  3.9  3.7   Chloride 96 - 106 mmol/L 101  104  104   CO2 20 - 29 mmol/L 19  19  19    Calcium 8.7 - 10.2 mg/dL 9.5  8.8  8.7   Total Protein 6.0 - 8.5 g/dL 6.2  5.8  6.7   Total Bilirubin 0.0 - 1.2 mg/dL 0.3  0.2  0.5   Alkaline Phos 41 - 116 IU/L 84  83  61   AST 0 - 40 IU/L 11  12  22    ALT 0 - 32 IU/L 14  11  25     Lipid Panel     Component Value Date/Time   CHOL 140 06/26/2024 1343   TRIG 91 06/26/2024 1343   HDL 56 06/26/2024 1343   CHOLHDL 2.5 06/26/2024 1343   LDLCALC 67 06/26/2024 1343    CBC    Component Value Date/Time   WBC 8.7 07/09/2024 1523   WBC 10.0 05/31/2024 2027   RBC 4.55 07/09/2024 1523   RBC 4.38 05/31/2024 2027   HGB 14.0 07/09/2024 1523   HCT 44.2 07/09/2024 1523   PLT 177 07/09/2024 1523   MCV 97 07/09/2024 1523   MCH 30.8 07/09/2024 1523   MCH 31.7 05/31/2024 2027   MCHC 31.7 07/09/2024 1523   MCHC 35.2 05/31/2024 2027   RDW 13.2 07/09/2024 1523   LYMPHSABS 2.1 07/09/2024 1523   MONOABS 0.9 02/07/2023 1731   EOSABS 0.1 07/09/2024 1523   BASOSABS 0.0 07/09/2024 1523    Results for orders placed or performed in visit on 07/09/24  CBC/D/Plt+RPR+Rh+ABO+RubIgG...   Collection Time: 07/09/24  3:23 PM  Result Value Ref Range   Hepatitis B Surface Ag Negative Negative   HCV Ab Non Reactive Non Reactive   RPR Ser Ql Non Reactive Non  Reactive   Rubella Antibodies, IGG 15.70 Immune >0.99 index   ABO Grouping B    Rh Factor Positive    Antibody Screen Negative Negative   HIV Screen 4th Generation wRfx Non Reactive Non Reactive   WBC 8.7 3.4 - 10.8 x10E3/uL   RBC 4.55 3.77 - 5.28 x10E6/uL   Hemoglobin 14.0 11.1 - 15.9 g/dL   Hematocrit 55.7 65.9 - 46.6 %   MCV 97 79 - 97 fL   MCH 30.8 26.6 - 33.0 pg   MCHC 31.7 31.5 - 35.7 g/dL   RDW 86.7 88.2 - 84.5 %   Platelets 177 150 - 450 x10E3/uL   Neutrophils 67 Not Estab. %   Lymphs 24 Not Estab. %   Monocytes 8 Not Estab. %   Eos 1 Not Estab. %   Basos 0 Not Estab. %   Neutrophils Absolute 5.8 1.4 - 7.0 x10E3/uL   Lymphocytes Absolute 2.1 0.7 - 3.1 x10E3/uL   Monocytes Absolute 0.7 0.1 - 0.9 x10E3/uL   EOS (ABSOLUTE) 0.1 0.0 - 0.4 x10E3/uL   Basophils Absolute 0.0 0.0 - 0.2 x10E3/uL   Immature Granulocytes 0 Not Estab. %   Immature Grans (Abs) 0.0 0.0 - 0.1 x10E3/uL  HgB A1c   Collection Time: 07/09/24  3:23 PM  Result Value Ref Range   Hgb A1c MFr Bld 5.3 4.8 - 5.6 %   Est. average glucose Bld gHb Est-mCnc 105 mg/dL  Comp Met (CMET)   Collection Time: 07/09/24  3:23 PM  Result Value Ref Range   Glucose 86 70 - 99 mg/dL   BUN 6 6 - 20 mg/dL   Creatinine, Ser 9.51 (L) 0.57 - 1.00 mg/dL   eGFR 868 >40 fO/fpw/8.26   BUN/Creatinine Ratio 13 9 - 23   Sodium 132 (L) 134 - 144 mmol/L   Potassium 4.3 3.5 - 5.2 mmol/L   Chloride 101 96 - 106 mmol/L   CO2 19 (L) 20 - 29 mmol/L   Calcium 9.5 8.7 - 10.2 mg/dL   Total Protein 6.2 6.0 - 8.5 g/dL   Albumin 3.9 (L) 4.0 - 5.0 g/dL   Globulin, Total 2.3 1.5 - 4.5 g/dL   Bilirubin Total 0.3 0.0 - 1.2 mg/dL   Alkaline Phosphatase 84 41 - 116 IU/L   AST 11 0 - 40 IU/L   ALT 14 0 - 32 IU/L  Anemia Profile B   Collection Time: 07/09/24  3:23 PM  Result Value Ref Range   Total Iron Binding Capacity 420 250 - 450 ug/dL   UIBC 705 868 - 574 ug/dL   Iron 873 27 - 840 ug/dL   Iron Saturation 30 15 - 55 %   Ferritin 54 15 -  150 ng/mL   Vitamin B-12 612 232 - 1,245 pg/mL   Folate 19.8 >3.0 ng/mL   Retic Ct Pct 2.5 0.6 - 2.6 %  Vitamin D  (25 hydroxy)   Collection Time: 07/09/24  3:23 PM  Result Value Ref Range   Vit D, 25-Hydroxy 18.0 (L) 30.0 - 100.0 ng/mL  Interpretation:   Collection Time: 07/09/24  3:23 PM  Result Value Ref Range   HCV Interp 1: Comment   Culture, OB Urine   Collection Time: 07/09/24  4:00 PM   Specimen: Urine   UR  Result Value Ref Range   Urine Culture, OB Final report   Urine Culture, OB Reflex   Collection Time: 07/09/24  4:00 PM  Result Value Ref Range   Organism ID, Bacteria Comment      ASSESSMENT AND PLAN:  Assessment & Plan Right upper quadrant abdominal tenderness without rebound tenderness  associated with 13-week pregnancy.  Right upper quadrant abdominal tenderness seems to be worse since changing her diet to high-protein low-carb carbohydrate.  She thinks that she started eating more fatty foods. She denies nausea, vomiting, diarrhea, constipation. She has seen her midwife who is ordered lab work 2 days ago that revealed normal liver functions and white count.  She also ordered an ultrasound of the abdomen She is encouraged to eat a low-fat diet. She is encouraged to call the clinic if her symptoms persist or worsen.  She wants to transfer her primary care here. I did put in an order for a follow-up in 30 days          Patient was given the opportunity to ask questions.  Patient verbalized understanding of the plan and was able to repeat key elements of the plan.   This documentation was completed using Paediatric nurse.  Any transcriptional errors are unintentional.     Requested Prescriptions    No prescriptions requested or ordered in this encounter    Return in about 4 weeks (around 08/08/2024) for  with PCP.  Burnetta Kohls H, NP     [1]  Current Outpatient Medications on File Prior to Visit  Medication Sig Dispense Refill    Blood Pressure Monitoring (BLOOD PRESSURE KIT) DEVI 1 Device by Does not apply route daily. 1 each 0   Prenatal MV & Min w/FA-DHA (PRENATAL GUMMIES PO) Take 2 tablets by mouth daily.     No current facility-administered medications on file prior to visit.  [2]  Allergies Allergen Reactions   Apple Juice     Patient allergic to Apple   Chocolate     In moderation   Erythromycin  Swelling   "

## 2024-07-11 NOTE — Patient Instructions (Signed)
 Today we discussed your right upper quadrant abdominal pain and your 13-week pregnancy. The 2 top diagnosis for right upper quadrant abdominal pain in pregnancy are fatty liver and gallbladder disease. Your liver functions were normal 2 days ago as well as a normal white count. I do not think we need to repeat labs today. The only dietary restriction I would suggest is a low-fat diet as a diet high in fat can cause the gallbladder pain. Keep your appointment to have your ultrasound for definitive diagnosis. Call us  if your symptoms persist or worsen.   Make an appointment in 30 days with us  for primary care

## 2024-07-12 ENCOUNTER — Ambulatory Visit: Payer: Self-pay | Admitting: Certified Nurse Midwife

## 2024-07-12 DIAGNOSIS — E559 Vitamin D deficiency, unspecified: Secondary | ICD-10-CM

## 2024-07-12 MED ORDER — VITAMIN D (ERGOCALCIFEROL) 1.25 MG (50000 UNIT) PO CAPS: 50000.0000 [IU] | ORAL_CAPSULE | ORAL | 0 refills | Status: AC

## 2024-07-17 ENCOUNTER — Other Ambulatory Visit

## 2024-07-17 ENCOUNTER — Encounter: Payer: Self-pay | Admitting: Obstetrics & Gynecology

## 2024-07-18 LAB — PANORAMA PRENATAL TEST FULL PANEL:PANORAMA TEST PLUS 5 ADDITIONAL MICRODELETIONS: FETAL FRACTION: 9.7

## 2024-07-25 ENCOUNTER — Other Ambulatory Visit

## 2024-08-05 ENCOUNTER — Other Ambulatory Visit

## 2024-08-05 DIAGNOSIS — Z3492 Encounter for supervision of normal pregnancy, unspecified, second trimester: Secondary | ICD-10-CM

## 2024-08-07 ENCOUNTER — Ambulatory Visit: Admitting: Pediatrics

## 2024-08-11 ENCOUNTER — Ambulatory Visit: Payer: Self-pay | Admitting: Nurse Practitioner

## 2024-08-13 ENCOUNTER — Encounter: Payer: Self-pay | Admitting: Certified Nurse Midwife

## 2025-06-29 ENCOUNTER — Encounter: Payer: Self-pay | Admitting: Nurse Practitioner
